# Patient Record
Sex: Female | Born: 1955 | Race: Black or African American | Hispanic: No | Smoking: Former smoker
Health system: Southern US, Community
[De-identification: ages and names within clinical notes are randomized; demographics above are authoritative.]

## PROBLEM LIST (undated history)

## (undated) DIAGNOSIS — E785 Hyperlipidemia, unspecified: Secondary | ICD-10-CM

## (undated) DIAGNOSIS — E669 Obesity, unspecified: Secondary | ICD-10-CM

## (undated) DIAGNOSIS — K219 Gastro-esophageal reflux disease without esophagitis: Secondary | ICD-10-CM

## (undated) DIAGNOSIS — K08109 Complete loss of teeth, unspecified cause, unspecified class: Secondary | ICD-10-CM

## (undated) DIAGNOSIS — T8859XA Other complications of anesthesia, initial encounter: Secondary | ICD-10-CM

## (undated) DIAGNOSIS — E11319 Type 2 diabetes mellitus with unspecified diabetic retinopathy without macular edema: Secondary | ICD-10-CM

## (undated) DIAGNOSIS — F419 Anxiety disorder, unspecified: Secondary | ICD-10-CM

## (undated) DIAGNOSIS — I10 Essential (primary) hypertension: Secondary | ICD-10-CM

## (undated) DIAGNOSIS — Z972 Presence of dental prosthetic device (complete) (partial): Secondary | ICD-10-CM

## (undated) DIAGNOSIS — R079 Chest pain, unspecified: Secondary | ICD-10-CM

## (undated) DIAGNOSIS — H3530 Unspecified macular degeneration: Secondary | ICD-10-CM

## (undated) DIAGNOSIS — Z9889 Other specified postprocedural states: Secondary | ICD-10-CM

## (undated) DIAGNOSIS — E119 Type 2 diabetes mellitus without complications: Secondary | ICD-10-CM

## (undated) DIAGNOSIS — H269 Unspecified cataract: Secondary | ICD-10-CM

## (undated) DIAGNOSIS — T4145XA Adverse effect of unspecified anesthetic, initial encounter: Secondary | ICD-10-CM

## (undated) DIAGNOSIS — I509 Heart failure, unspecified: Secondary | ICD-10-CM

## (undated) DIAGNOSIS — I5022 Chronic systolic (congestive) heart failure: Secondary | ICD-10-CM

## (undated) HISTORY — PX: TONSILLECTOMY: SUR1361

## (undated) HISTORY — DX: Unspecified macular degeneration: H35.30

## (undated) HISTORY — DX: Type 2 diabetes mellitus with unspecified diabetic retinopathy without macular edema: E11.319

## (undated) HISTORY — DX: Unspecified cataract: H26.9

## (undated) HISTORY — PX: MULTIPLE TOOTH EXTRACTIONS: SHX2053

## (undated) HISTORY — DX: Chronic systolic (congestive) heart failure: I50.22

## (undated) HISTORY — PX: CHOLECYSTECTOMY: SHX55

---

## 1998-04-23 ENCOUNTER — Emergency Department (HOSPITAL_COMMUNITY): Admission: EM | Admit: 1998-04-23 | Discharge: 1998-04-24 | Payer: Self-pay

## 2005-05-21 ENCOUNTER — Emergency Department (HOSPITAL_COMMUNITY): Admission: EM | Admit: 2005-05-21 | Discharge: 2005-05-21 | Payer: Self-pay | Admitting: Emergency Medicine

## 2006-10-05 ENCOUNTER — Encounter: Admission: RE | Admit: 2006-10-05 | Discharge: 2006-10-05 | Payer: Self-pay | Admitting: Family Medicine

## 2010-01-25 ENCOUNTER — Emergency Department (HOSPITAL_COMMUNITY): Admission: EM | Admit: 2010-01-25 | Discharge: 2010-01-25 | Payer: Self-pay | Admitting: Family Medicine

## 2010-12-03 ENCOUNTER — Inpatient Hospital Stay (INDEPENDENT_AMBULATORY_CARE_PROVIDER_SITE_OTHER)
Admission: RE | Admit: 2010-12-03 | Discharge: 2010-12-03 | Disposition: A | Discharge status: Home / Self Care | Payer: Self-pay | Source: Ambulatory Visit | Attending: Emergency Medicine | Admitting: Emergency Medicine

## 2010-12-03 DIAGNOSIS — S335XXA Sprain of ligaments of lumbar spine, initial encounter: Secondary | ICD-10-CM

## 2010-12-03 DIAGNOSIS — IMO0002 Reserved for concepts with insufficient information to code with codable children: Secondary | ICD-10-CM

## 2011-09-17 ENCOUNTER — Emergency Department (INDEPENDENT_AMBULATORY_CARE_PROVIDER_SITE_OTHER)
Admission: EM | Admit: 2011-09-17 | Discharge: 2011-09-17 | Disposition: A | Discharge status: Home / Self Care | Payer: Self-pay | Source: Home / Self Care | Attending: Emergency Medicine | Admitting: Emergency Medicine

## 2011-09-17 ENCOUNTER — Encounter (HOSPITAL_COMMUNITY): Payer: Self-pay

## 2011-09-17 DIAGNOSIS — N6019 Diffuse cystic mastopathy of unspecified breast: Secondary | ICD-10-CM

## 2011-09-17 MED ORDER — TRAMADOL HCL 50 MG PO TABS: 100.0000 mg | ORAL_TABLET | Freq: Three times a day (TID) | ORAL | Status: AC | PRN

## 2011-09-17 MED ORDER — NAPROXEN 500 MG PO TABS: 500.0000 mg | ORAL_TABLET | Freq: Two times a day (BID) | ORAL | Status: DC

## 2011-09-17 NOTE — ED Provider Notes (Addendum)
Chief Complaint  Patient presents with  . Breast Pain    History of Present Illness:   Tina Lara is a 56 year old female with a three-day history of pain in the left breast. This is located in the lower, outer quadrant. It's tender to touch. She denies any mass, lump, or abscess. There is no drainage from the nipple. She has no history of breast cysts, breast lumps, breast cancer, and no family history of breast cancer.  Review of Systems:  Other than noted above, the patient denies any of the following symptoms: Systemic:  No fever, chills, sweats, weight loss, or fatigue. ENT:  No nasal congestion, rhinorrhea, sore throat, swelling of lips, tongue or throat. Resp:  No cough, wheezing, or shortness of breath. Skin:  No rash, itching, nodules, or suspicious lesions.  PMFSH:  Past medical history, family history, social history, meds, and allergies were reviewed.  Physical Exam:   Vital signs:  BP 155/90  Pulse 82  Temp 98.6 F (37 C) (Oral)  Resp 18  SpO2 99% Gen:  Alert, oriented, in no distress. ENT:  Pharynx clear, no intraoral lesions, moist mucous membranes. Lungs:  Clear to auscultation. Breast exam: Shows extremely large and pendulous breasts. Exam reveals no mass, lump, or palpable abnormality. There was some tenderness to palpation in the lower, outer quadrant of the left breast. There was no nipple discharge. No axillary adenopathy, mass, or tenderness. Skin:  Skin was clear without any overlying skin changes.  Assessment:  The encounter diagnosis was Fibrocystic breast disease.  Plan:   1.  The following meds were prescribed:   New Prescriptions   NAPROXEN (NAPROSYN) 500 MG TABLET    Take 1 tablet (500 mg total) by mouth 2 (two) times daily.   TRAMADOL (ULTRAM) 50 MG TABLET    Take 2 tablets (100 mg total) by mouth every 8 (eight) hours as needed for pain.   2.  The patient was instructed in symptomatic care and handouts were given. 3.  The patient was told to return  if becoming worse in any way, if no better in 3 or 4 days, and given some red flag symptoms that would indicate earlier return.  Follow up:  The [facility]      Reuben Likes, MD 09/17/11 1645  Reuben Likes, MD 09/17/11 [phone]

## 2011-09-17 NOTE — ED Notes (Signed)
Pain in breast x 2 weeks , no trauma

## 2011-09-17 NOTE — Discharge Instructions (Signed)
Fibrocystic Breast Changes Fibrocystic breast changes is a non-cancerous(benign) condition that about half of all women have at some time in their life. It is also called benign breast disease and mammary dysplasia. It may also be called fibrocystic breast disease, but it is not really a disease. It is a common condition that occurs when women go through the hormonal changes during their menstrual cycle, between the ages of 20 to 50. Menopausal women do not have this problem, unless they are on hormone therapy. It can affect one or both breasts. This is not a sign that you will later get cancer. CAUSES  Overgrowth of cells lining the milk ducts, or enlarged lobules in the breast, cause the breast duct to become blocked. The duct then fills up with fluid. This is like a small balloon filled with water. It is called a cyst. Over time, with repeated inflammation there is a tendency to form scar tissue. This scar tissue becomes the fibrous part of fibrocystic disease. The exact cause of this happening is not known, but it may be related to the female hormones, estrogen and progesterone. Heredity (genetics) may also be a factor in some cases. SYMPTOMS   Tenderness.   Swelling.   Rope-like feeling.   Lumpy breast, one or both sides.   Changes in the size of the breasts, before and after the menstrual period (larger before, smaller after).   Green or dark brown nipple discharge (not blood).  Symptoms are usually worse before periods (menstrual cycle) and get better toward the end of menstruation. Usually, it is temporary minor discomfort. But some women have severe pain.  DIAGNOSIS  Check your breasts monthly. The best time to check your breasts is after your period. If you check them during your period, you are more likely to feel the normal glands enlarged, as a result of the hormonal changes that happen right before your period. If you do not have menstrual periods, check your breasts the first day of  every month. Become familiar with the way your own breasts feel. It is then easier to notice if there are changes, such as more tenderness, a new growth, change in breast size, or a change in a lump that has always been there. All breasts lumps need to be investigated, to rule out breast cancer. See your caregiver as soon as possible, if you find a lump. Most breast lumps are not cancerous. Excellent treatment is available for ones that are.  To make a diagnosis, your caregiver will examine your breasts and may recommend other tests, such as:  Mammogram (breast X-ray).   Ultrasound.   MRI (magnetic resonance imaging).   Removing fluid from the cyst with a fine needle, under local anesthesia (aspiration).   Taking a breast tissue sample (breast biopsy).  Some questions your caregiver will ask are:  What was the date of your last period?   When did the lump show up?   Is there any discharge from your breast?   Is the breast tender or painful?   Are the symptoms in one or both breasts?   Has the lump changed in size from month-to-month? How long has it been present?   Any family history of breast problems?   Any past breast problems?   Any history of breast surgery?   Are you taking any medications?   When was your last mammogram, and where was it done?  TREATMENT   Dietary changes help to prevent or reduce the symptoms of fibrocystic   breast changes.   You may need to stop consuming all foods that contain caffeine, such as chocolate, sodas, coffee, and tea.   Reducing sugar and fat in your diet may also help.   Decrease estrogen in your diet. Some sources include commercially raised meats which contain estrogen. Eliminate other natural estrogens.   Birth control pills can also make symptoms worse.   Natural progesterone cream, applied at a dose of 15 to 20 milligrams per day, from ovulation until a day or two before your period returns, may help with returning to normal  breast tissue over several months. Seek advice from your caregiver.   Over-the-counter pain pills may help, as recommended by your caregiver.   Danazol hormone (female-like hormone) is sometimes used. It may cause hair growth and acne.   Needle aspiration can be used, to remove fluid from the cyst.   Surgery may be needed, to remove a large, persistent, and tender cyst.   Evening primrose oil may help with the tenderness and pain. It has linolenic acid that women may not have enough of.  HOME CARE INSTRUCTIONS   Examine your breasts after every menstrual period.   If you do not have menstrual periods, examine your breasts the first day of every month.   Wear a firm support bra, especially when exercising.   Decrease or avoid caffeine in your diet.   Decrease the fat and sugar in your diet.   Eat a balanced diet.   Try to see your caregiver after you have a menstrual period.   Before seeing your caregiver, make notes about:   When you have the symptoms.   What types of symptoms you are having.   Medications you are taking.   When and where your last mammogram was taken.   Past breast problems or breast surgery.  SEEK MEDICAL CARE IF:   You have been diagnosed with fibrocystic breast changes, and you develop changes in your breast:   Discharge from the nipple, especially bloody discharge.   Pain in the breast that does not go away after your menstrual period.   New lumps or bumps in the breast.   Lumps in your armpit.   Your breast or breasts become enlarged, red, and painful.   You find an isolated lump, even if it is not tender.   You have questions about this condition that have not been answered.  Document Released: 01/03/2006 Document Revised: 03/08/2011 Document Reviewed: 03/30/2009 [facility] Patient Information 2012 [place], [place].   Take vitamin E 400 units daily.  Avoid caffeine.

## 2011-09-19 ENCOUNTER — Telehealth (HOSPITAL_COMMUNITY): Payer: Self-pay | Admitting: *Deleted

## 2011-09-19 ENCOUNTER — Other Ambulatory Visit: Payer: Self-pay | Admitting: Emergency Medicine

## 2011-09-19 NOTE — ED Notes (Signed)
I called pt. to let her know that [facility] was going [facility] be [facility] her for an appointment. I left a message for her to call. Vassie Moselle 09/19/2011

## 2011-09-19 NOTE — ED Notes (Addendum)
Dr. Lorenz Coaster sent me a message saying pt. needs to be scheduled for diagnostic mammogram and [place] if needed for diagnosis- no lump pain in lower outer quadrant x 3 days. He did not put pt.'s name on request. He could not remember her name.  Tina Lara was able to pull up all his pt.'s for 6/17 and pt.'s name was found.  I filled out a referral fom and faxed it to [phone]. Confirmation received.  [facility]. She said she could not schedule unless I told her if pt. ever had a Mammogram before. I asked Dr. Lorenz Coaster and he said he did not think so. I told her this.  She asked " she's never had a mammogram before?" I said not that he knows of. I asked if she has EPIC and she said yes. I suggested she call the pt. and ask her to verify and then schedule the appointment. Vassie Moselle 09/19/2011

## 2011-09-20 ENCOUNTER — Other Ambulatory Visit: Payer: Self-pay | Admitting: Emergency Medicine

## 2011-09-20 ENCOUNTER — Telehealth (HOSPITAL_COMMUNITY): Payer: Self-pay | Admitting: *Deleted

## 2011-09-20 DIAGNOSIS — N644 Mastodynia: Secondary | ICD-10-CM

## 2011-09-21 ENCOUNTER — Telehealth (HOSPITAL_COMMUNITY): Payer: Self-pay | Admitting: *Deleted

## 2011-09-21 NOTE — ED Notes (Signed)
Pt.contacted on third call and states she does have an appointment next Friday 6/28  @ 1000.  I told her I would let Dr. Lorenz Coaster know and hoped everything was OK. Tina Lara 09/21/2011

## 2011-09-28 ENCOUNTER — Ambulatory Visit
Admission: RE | Admit: 2011-09-28 | Discharge: 2011-09-28 | Disposition: A | Discharge status: Home / Self Care | Payer: No Typology Code available for payment source | Source: Ambulatory Visit | Attending: Emergency Medicine | Admitting: Emergency Medicine

## 2011-09-28 DIAGNOSIS — N644 Mastodynia: Secondary | ICD-10-CM

## 2011-10-25 ENCOUNTER — Emergency Department (INDEPENDENT_AMBULATORY_CARE_PROVIDER_SITE_OTHER): Payer: Self-pay

## 2011-10-25 ENCOUNTER — Encounter (HOSPITAL_COMMUNITY): Payer: Self-pay | Admitting: *Deleted

## 2011-10-25 ENCOUNTER — Emergency Department (HOSPITAL_COMMUNITY)
Admission: EM | Admit: 2011-10-25 | Discharge: 2011-10-25 | Disposition: A | Discharge status: Home / Self Care | Payer: Self-pay | Source: Home / Self Care | Attending: Family Medicine | Admitting: Family Medicine

## 2011-10-25 DIAGNOSIS — M5380 Other specified dorsopathies, site unspecified: Secondary | ICD-10-CM

## 2011-10-25 DIAGNOSIS — M62830 Muscle spasm of back: Secondary | ICD-10-CM

## 2011-10-25 DIAGNOSIS — G548 Other nerve root and plexus disorders: Secondary | ICD-10-CM

## 2011-10-25 LAB — POCT I-STAT, CHEM 8
BUN: 7 mg/dL (ref 6–23)
Calcium, Ion: 1.21 mmol/L (ref 1.12–1.23)
Chloride: 104 mEq/L (ref 96–112)
Creatinine, Ser: 0.9 mg/dL (ref 0.50–1.10)
Glucose, Bld: 156 mg/dL — ABNORMAL HIGH (ref 70–99)
HCT: 46 % (ref 36.0–46.0)
Hemoglobin: 15.6 g/dL — ABNORMAL HIGH (ref 12.0–15.0)
Potassium: 4.1 mEq/L (ref 3.5–5.1)
Sodium: 142 mEq/L (ref 135–145)
TCO2: 26 mmol/L (ref 0–100)

## 2011-10-25 MED ORDER — TRAMADOL HCL 50 MG PO TABS: 50.0000 mg | ORAL_TABLET | Freq: Three times a day (TID) | ORAL | Status: AC | PRN

## 2011-10-25 MED ORDER — NAPROXEN 500 MG PO TABS: 500.0000 mg | ORAL_TABLET | Freq: Two times a day (BID) | ORAL | Status: DC

## 2011-10-25 MED ORDER — CYCLOBENZAPRINE HCL 10 MG PO TABS: 10.0000 mg | ORAL_TABLET | Freq: Two times a day (BID) | ORAL | Status: AC | PRN

## 2011-10-25 MED ORDER — PREDNISONE 20 MG PO TABS: ORAL_TABLET | ORAL | Status: AC

## 2011-10-25 NOTE — ED Provider Notes (Signed)
History     CSN: [id number]  Arrival date & time 10/25/11  1127   First MD Initiated Contact with Patient 10/25/11 1156      Chief Complaint  Patient presents with  . Shoulder Pain    (Consider location/radiation/quality/duration/timing/severity/associated sxs/prior treatment) HPI Comments: 56 y/o morbidly obese female here c/o burning type of pain in left breast and left chest radiating horizontally to left upper back present for about 2 months. Patient was seen on Jun 17/2013 for similar complaint and was referred for a mammogram she had done on June 28/2013 that was not suggestive of breast malignancy. She describes her pain a superficial burning sensation of the skin worse with friction or touching the area. Pain worse in her left upper back. She denies a history of a rash, prior to beginning of pain. Denies cough, shortness of breath or retrosternal chest pain, also denies abdominal pain. Pain no changed by exertion. Has had relief with tramadol but she ran out.    History reviewed. No pertinent past medical history.  History reviewed. No pertinent past surgical history.  No family history on file.  History  Substance Use Topics  . Smoking status: Not on file  . Smokeless tobacco: Not on file  . Alcohol Use: Not on file    OB History    Grav Para Term Preterm Abortions TAB SAB Ect Mult Living                  Review of Systems  Constitutional: Negative for fever, chills, diaphoresis, activity change, appetite change and unexpected weight change.  HENT: Negative for congestion.   Respiratory: Negative for cough, chest tightness and shortness of breath.   Gastrointestinal: Negative for nausea, abdominal pain, diarrhea and constipation.  Skin: Negative for rash.  Neurological: Negative for dizziness.  All other systems reviewed and are negative.    Allergies  Review of patient's allergies indicates no known allergies.  Home Medications   Current Outpatient Rx    Name Route Sig Dispense Refill  . CYCLOBENZAPRINE HCL 10 MG PO TABS Oral Take 1 tablet (10 mg total) by mouth 2 (two) times daily as needed for muscle spasms. 20 tablet 0  . NAPROXEN 500 MG PO TABS Oral Take 1 tablet (500 mg total) by mouth 2 (two) times daily with a meal. 30 tablet 0  . PREDNISONE 20 MG PO TABS  2 tabs oral daily for 5 days 10 tablet 0  . TRAMADOL HCL 50 MG PO TABS Oral Take 1 tablet (50 mg total) by mouth every 8 (eight) hours as needed for pain. 20 tablet 0    BP 166/97  Pulse 88  Temp 98.6 F (37 C) (Oral)  Resp 22  Physical Exam  Nursing note and vitals reviewed. Constitutional: She is oriented to person, place, and time. She appears well-developed and well-nourished. No distress.  HENT:  Head: Normocephalic and atraumatic.  Mouth/Throat: Oropharynx is clear and moist.  Eyes: Conjunctivae are normal. Pupils are equal, round, and reactive to light.  Neck: Normal range of motion. Neck supple. No JVD present. No thyromegaly present.  Cardiovascular: Normal rate, regular rhythm and normal heart sounds.  Exam reveals no gallop and no friction rub.   No murmur heard. Pulmonary/Chest: Effort normal and breath sounds normal. No respiratory distress. She has no wheezes. She has no rales. She exhibits no tenderness.       Hyperesthesia with light touch lateral to left breast extending horizontally to skin at intercostal space  towards the back. No erythema, no swelling or bruising. No rib crepitus. No rash. No deformity. Normal chest expansion with deep inspiration. Patient has morbidly large breasts. That other wise appear normal on exam. No nodules, masses or nipple discharge. No obvious skin changes over breast.  No axillary or supraclavicular adenopathies.   Abdominal: Soft. There is no tenderness.       obese  Musculoskeletal:       Back: central. Increased tone and tenderness to palpation over left thoracic paravertebral muscles.  Normal left shoulder exam with FROM.   Lymphadenopathy:    She has no cervical adenopathy.  Neurological: She is alert and oriented to person, place, and time.  Skin: No rash noted.    ED Course  Procedures (including critical care time)  Labs Reviewed  POCT I-STAT, CHEM 8 - Abnormal; Notable for the following:    Glucose, Bld 156 (*)     Hemoglobin 15.6 (*)     All other components within normal limits  LAB REPORT - SCANNED   Dg Chest 2 View  10/25/2011  *RADIOLOGY REPORT*  Clinical Data: Left side mid thoracic pain, left anterior chest pain in the breast  CHEST - 2 VIEW  Comparison: None  Findings: Enlargement of cardiac silhouette. Mediastinal contours and pulmonary vascularity normal. Lungs clear. No pleural effusion or pneumothorax. No acute osseous findings.  IMPRESSION: Enlargement of cardiac silhouette. No acute abnormalities.  Original Report Authenticated By: Lollie Marrow, M.D.     1. Intercostal neuropathic pain   2. Muscle spasm of back       MDM  EKG: Normal sinus rhythm ventricular rate 76 beats per minutes, no ischemic changes. Random glucose normal. Impress neuropathic type of pain involving an intercostal nerve. Patient at risk for back strain related problems including radiculopathy and chronic back pain as she carries very large breasts. Treated with prednisone, flexeril, tramadol and naprosyn. Back exercises hand out provided. Given community resource list to find a PCP.     Sharin Grave, MD 10/26/11 0981

## 2011-10-25 NOTE — ED Notes (Signed)
Pt  Reports  Pain  l  Shoulder  And  Upper  Back  For over  1   Month    Pt     denys  Any  specefic  Injury     -  She  Is  Sitting  Upright on the  Exam table  Speaking in  Complete  sentances  In no  Distress

## 2011-12-04 ENCOUNTER — Encounter (HOSPITAL_COMMUNITY): Payer: Self-pay | Admitting: Emergency Medicine

## 2011-12-04 ENCOUNTER — Emergency Department (HOSPITAL_COMMUNITY)
Admission: EM | Admit: 2011-12-04 | Discharge: 2011-12-04 | Disposition: A | Discharge status: Home / Self Care | Payer: Self-pay | Attending: Emergency Medicine | Admitting: Emergency Medicine

## 2011-12-04 ENCOUNTER — Emergency Department (HOSPITAL_COMMUNITY): Payer: Self-pay

## 2011-12-04 DIAGNOSIS — Z79899 Other long term (current) drug therapy: Secondary | ICD-10-CM | POA: Insufficient documentation

## 2011-12-04 DIAGNOSIS — I10 Essential (primary) hypertension: Secondary | ICD-10-CM | POA: Insufficient documentation

## 2011-12-04 DIAGNOSIS — R109 Unspecified abdominal pain: Secondary | ICD-10-CM | POA: Insufficient documentation

## 2011-12-04 DIAGNOSIS — K5900 Constipation, unspecified: Secondary | ICD-10-CM | POA: Insufficient documentation

## 2011-12-04 HISTORY — DX: Essential (primary) hypertension: I10

## 2011-12-04 LAB — CBC WITH DIFFERENTIAL/PLATELET
Basophils Absolute: 0 10*3/uL (ref 0.0–0.1)
Basophils Relative: 1 % (ref 0–1)
Eosinophils Absolute: 0.1 10*3/uL (ref 0.0–0.7)
Eosinophils Relative: 2 % (ref 0–5)
HCT: 43.4 % (ref 36.0–46.0)
Hemoglobin: 14.9 g/dL (ref 12.0–15.0)
Lymphocytes Relative: 44 % (ref 12–46)
Lymphs Abs: 2.7 10*3/uL (ref 0.7–4.0)
MCH: 29.2 pg (ref 26.0–34.0)
MCHC: 34.3 g/dL (ref 30.0–36.0)
MCV: 85.1 fL (ref 78.0–100.0)
Monocytes Absolute: 0.5 10*3/uL (ref 0.1–1.0)
Monocytes Relative: 9 % (ref 3–12)
Neutro Abs: 2.7 10*3/uL (ref 1.7–7.7)
Neutrophils Relative %: 45 % (ref 43–77)
Platelets: 204 10*3/uL (ref 150–400)
RBC: 5.1 MIL/uL (ref 3.87–5.11)
RDW: 12.4 % (ref 11.5–15.5)
WBC: 6.1 10*3/uL (ref 4.0–10.5)

## 2011-12-04 LAB — BASIC METABOLIC PANEL
BUN: 11 mg/dL (ref 6–23)
CO2: 25 mEq/L (ref 19–32)
Calcium: 9.7 mg/dL (ref 8.4–10.5)
Chloride: 100 mEq/L (ref 96–112)
Creatinine, Ser: 0.75 mg/dL (ref 0.50–1.10)
GFR calc Af Amer: >90 mL/min (ref >90)
GFR calc non Af Amer: >90 mL/min (ref >90)
Glucose, Bld: 202 mg/dL — ABNORMAL HIGH (ref 70–99)
Potassium: 4.2 mEq/L (ref 3.5–5.1)
Sodium: 138 mEq/L (ref 135–145)

## 2011-12-04 LAB — URINALYSIS, ROUTINE W REFLEX MICROSCOPIC
Glucose, UA: NEGATIVE mg/dL
Ketones, ur: 15 mg/dL — AB
Leukocytes, UA: NEGATIVE
Nitrite: NEGATIVE
Protein, ur: NEGATIVE mg/dL
Specific Gravity, Urine: 1.025 (ref 1.005–1.030)
Urobilinogen, UA: 1 mg/dL (ref 0.0–1.0)
pH: 6 (ref 5.0–8.0)

## 2011-12-04 LAB — URINE MICROSCOPIC-ADD ON

## 2011-12-04 LAB — LIPASE, BLOOD: Lipase: 22 U/L (ref 11–59)

## 2011-12-04 MED ORDER — IOHEXOL 300 MG/ML  SOLN
100.0000 mL | Freq: Once | INTRAMUSCULAR | Status: AC | PRN
  Administered 2011-12-04: 100 mL via INTRAVENOUS

## 2011-12-04 MED ORDER — HYDROCODONE-ACETAMINOPHEN 5-500 MG PO TABS: 1.0000 | ORAL_TABLET | Freq: Four times a day (QID) | ORAL | Status: AC | PRN

## 2011-12-04 MED ORDER — IOHEXOL 300 MG/ML  SOLN
20.0000 mL | INTRAMUSCULAR | Status: AC
  Administered 2011-12-04: 20 mL via ORAL

## 2011-12-04 MED ORDER — POLYETHYLENE GLYCOL 3350 17 G PO PACK: 17.0000 g | PACK | Freq: Every day | ORAL | Status: AC

## 2011-12-04 NOTE — ED Notes (Signed)
Pt returned from CT °

## 2011-12-04 NOTE — ED Notes (Signed)
Ct Called to notify pt done with contrast.

## 2011-12-04 NOTE — ED Notes (Addendum)
Pt states she is having sharp shooting pain in LUQ, has difficulty sleeping. Pain has been going on for a week. Can't have bowel movement without taking ex lax. Took 2 yesterday. Thought she had gas or needed to have a bowel movement but pain was unrelieved. Took two advil yesterday evening to relieve pain but did not work.

## 2011-12-04 NOTE — ED Notes (Signed)
Patient transported to CT 

## 2011-12-04 NOTE — ED Provider Notes (Signed)
History     CSN: [id number]  Arrival date & time 12/04/11  0940   First MD Initiated Contact with Patient 12/04/11 1116      Chief Complaint  Patient presents with  . Flank Pain  . Abdominal Pain    (Consider location/radiation/quality/duration/timing/severity/associated sxs/prior treatment) HPI Comments: Patient presents with complaints of left-sided abdominal pain. She states it started about one week ago and has been gradually getting worse. Pain is 2 the left midabdomen and is nonradiating. She denies any nausea vomiting. She has been having some constipation but had a normal bowel movement yesterday and states the pain is not changed after bowel movements. Denies any diarrhea. Denies any blood in her stool. Denies a fevers or chills. Denies any urinary symptoms. She's been taking over-the-counter medicines without relief. She's also been taking stool softeners without relief.  Patient is a 56 y.o. female presenting with flank pain and abdominal pain.  Flank Pain Associated symptoms include abdominal pain. Pertinent negatives include no chest pain, no headaches and no shortness of breath.  Abdominal Pain The primary symptoms of the illness include abdominal pain. The primary symptoms of the illness do not include fever, fatigue, shortness of breath, nausea, vomiting or diarrhea.  Symptoms associated with the illness do not include chills, diaphoresis, hematuria, frequency or back pain.    Past Medical History  Diagnosis Date  . Hypertension     History reviewed. No pertinent past surgical history.  History reviewed. No pertinent family history.  History  Substance Use Topics  . Smoking status: Never Smoker   . Smokeless tobacco: Not on file  . Alcohol Use: No    OB History    Grav Para Term Preterm Abortions TAB SAB Ect Mult Living                  Review of Systems  Constitutional: Negative for fever, chills, diaphoresis and fatigue.  HENT: Negative for  congestion, rhinorrhea and sneezing.   Eyes: Negative.   Respiratory: Negative for cough, chest tightness and shortness of breath.   Cardiovascular: Negative for chest pain and leg swelling.  Gastrointestinal: Positive for abdominal pain. Negative for nausea, vomiting, diarrhea and blood in stool.  Genitourinary: Positive for flank pain. Negative for frequency, hematuria and difficulty urinating.  Musculoskeletal: Negative for back pain and arthralgias.  Skin: Negative for rash.  Neurological: Negative for dizziness, speech difficulty, weakness, numbness and headaches.    Allergies  Review of patient's allergies indicates no known allergies.  Home Medications   Current Outpatient Rx  Name Route Sig Dispense Refill  . IBUPROFEN 200 MG PO TABS Oral Take 400 mg by mouth every 6 (six) hours as needed. For pain    . OVER THE COUNTER MEDICATION Oral Take 2 scoop by mouth daily. [employer] Women's Multi-Vitamin    . HYDROCODONE-ACETAMINOPHEN 5-500 MG PO TABS Oral Take 1-2 tablets by mouth every 6 (six) hours as needed for pain. 15 tablet 0  . POLYETHYLENE GLYCOL 3350 PO PACK Oral Take 17 g by mouth daily. 14 each 0    BP 155/77  Pulse 98  Temp 97.8 F (36.6 C) (Oral)  Resp 17  SpO2 98%  LMP 04/05/2011  Physical Exam  Constitutional: She is oriented to person, place, and time. She appears well-developed and well-nourished.  HENT:  Head: Normocephalic and atraumatic.  Eyes: Pupils are equal, round, and reactive to light.  Neck: Normal range of motion. Neck supple.  Cardiovascular: Normal rate, regular rhythm and normal heart  sounds.   Pulmonary/Chest: Effort normal and breath sounds normal. No respiratory distress. She has no wheezes. She has no rales. She exhibits no tenderness.  Abdominal: Soft. Bowel sounds are normal. There is tenderness (Moderate tenderness to the left upper quadrant.). There is no rebound and no guarding.  Musculoskeletal: Normal range of motion. She exhibits no edema.   Lymphadenopathy:    She has no cervical adenopathy.  Neurological: She is alert and oriented to person, place, and time.  Skin: Skin is warm and dry. No rash noted.  Psychiatric: She has a normal mood and affect.    ED Course  Procedures (including critical care time) Results for orders placed during the hospital encounter of 12/04/11  CBC WITH DIFFERENTIAL      Component Value Range   WBC 6.1  4.0 - 10.5 K/uL   RBC 5.10  3.87 - 5.11 MIL/uL   Hemoglobin 14.9  12.0 - 15.0 g/dL   HCT 16.1  09.6 - 04.5 %   MCV 85.1  78.0 - 100.0 fL   MCH 29.2  26.0 - 34.0 pg   MCHC 34.3  30.0 - 36.0 g/dL   RDW 40.9  81.1 - 91.4 %   Platelets 204  150 - 400 K/uL   Neutrophils Relative 45  43 - 77 %   Neutro Abs 2.7  1.7 - 7.7 K/uL   Lymphocytes Relative 44  12 - 46 %   Lymphs Abs 2.7  0.7 - 4.0 K/uL   Monocytes Relative 9  3 - 12 %   Monocytes Absolute 0.5  0.1 - 1.0 K/uL   Eosinophils Relative 2  0 - 5 %   Eosinophils Absolute 0.1  0.0 - 0.7 K/uL   Basophils Relative 1  0 - 1 %   Basophils Absolute 0.0  0.0 - 0.1 K/uL  BASIC METABOLIC PANEL      Component Value Range   Sodium 138  135 - 145 mEq/L   Potassium 4.2  3.5 - 5.1 mEq/L   Chloride 100  96 - 112 mEq/L   CO2 25  19 - 32 mEq/L   Glucose, Bld 202 (*) 70 - 99 mg/dL   BUN 11  6 - 23 mg/dL   Creatinine, Ser 7.82  0.50 - 1.10 mg/dL   Calcium 9.7  8.4 - 95.6 mg/dL   GFR calc non Af Amer >90  >90 mL/min   GFR calc Af Amer >90  >90 mL/min  LIPASE, BLOOD      Component Value Range   Lipase 22  11 - 59 U/L  URINALYSIS, ROUTINE W REFLEX MICROSCOPIC      Component Value Range   Color, Urine AMBER (*) YELLOW   APPearance CLEAR  CLEAR   Specific Gravity, Urine 1.025  1.005 - 1.030   pH 6.0  5.0 - 8.0   Glucose, UA NEGATIVE  NEGATIVE mg/dL   Hgb urine dipstick TRACE (*) NEGATIVE   Bilirubin Urine SMALL (*) NEGATIVE   Ketones, ur 15 (*) NEGATIVE mg/dL   Protein, ur NEGATIVE  NEGATIVE mg/dL   Urobilinogen, UA 1.0  0.0 - 1.0 mg/dL    Nitrite NEGATIVE  NEGATIVE   Leukocytes, UA NEGATIVE  NEGATIVE  URINE MICROSCOPIC-ADD ON      Component Value Range   Squamous Epithelial / LPF FEW (*) RARE   WBC, UA 0-2  <3 WBC/hpf   RBC / HPF 0-2  <3 RBC/hpf   Bacteria, UA RARE  RARE   Ct Abdomen Pelvis  W Contrast  12/04/2011  *RADIOLOGY REPORT*  Clinical Data: Left upper quadrant pain for 1 week.  CT ABDOMEN AND PELVIS WITH CONTRAST  Technique:  Multidetector CT imaging of the abdomen and pelvis was performed following the standard protocol during bolus administration of intravenous contrast.  Contrast: OMNIPAQUE IOHEXOL 300 MG/ML  SOLN, 1 OMNIPAQUE IOHEXOL 300 MG/ML  SOLN  Comparison: None.  Findings: Lung Bases: Dependent atelectasis.  5 mm subpleural pulmonary nodule in the left lower lobe (image 2 series 3).  Right middle lobe scarring is present.  Liver:  Normal.  Spleen:  Normal.  Gallbladder:  Cholecystectomy.  Common bile duct:  Normal.  Pancreas:  Normal.  Adrenal glands:  Normal.  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  Ureters appear within normal limits.  Stomach:  Normal.  Small bowel:  Duodenum normal.  Small bowel appears normal.  Fat containing ventral hernia is present to the right of midline (image number 38 series 2).  Metallic clip or calcification is present nearby.  Question prior attempted hernia repair.  Colon:   Colonic diverticulosis.  No colonic inflammatory changes. Normal appendix.  Pelvic Genitourinary:  Normal urinary bladder.  Uterine mass and adnexa appear within normal limits.  Artifact is present in the pelvis associated with fat contacting the detector ring.  Bones:  Right greater than left hip osteoarthritis.  No aggressive osseous lesions.  Vasculature: Mild atherosclerosis.  IMPRESSION: No acute abnormality.  Cholecystectomy.  Small fat containing right upper abdominal ventral hernia.   Original Report Authenticated By: Andreas Newport, M.D.       1. Abdominal pain        MDM  Pt feeling better.  No signs of diverticulitis, kidney stone, other pathology in abdomen that would explain pain.  No chest pain or PE symptoms.  Will d/c with pain meds, miralax.  F/u with a PMD or return here if symptoms worsen.        Rolan Bucco, MD 12/04/11 1414

## 2011-12-04 NOTE — ED Notes (Signed)
Pt states no pain when urinating, no blood, and no trouble urinating. She states that when she lifts left breast she belches.

## 2011-12-04 NOTE — ED Notes (Signed)
Pt c/o LUQ pain to left flank pain x 1 week that became worse today; pt sts some constipation due to pain meds x several days

## 2012-11-09 ENCOUNTER — Encounter (HOSPITAL_COMMUNITY): Payer: Self-pay | Admitting: *Deleted

## 2012-11-09 ENCOUNTER — Emergency Department (INDEPENDENT_AMBULATORY_CARE_PROVIDER_SITE_OTHER)
Admission: EM | Admit: 2012-11-09 | Discharge: 2012-11-09 | Disposition: A | Discharge status: Home / Self Care | Payer: Self-pay | Source: Home / Self Care | Attending: Emergency Medicine | Admitting: Emergency Medicine

## 2012-11-09 DIAGNOSIS — M792 Neuralgia and neuritis, unspecified: Secondary | ICD-10-CM

## 2012-11-09 DIAGNOSIS — IMO0002 Reserved for concepts with insufficient information to code with codable children: Secondary | ICD-10-CM

## 2012-11-09 HISTORY — DX: Obesity, unspecified: E66.9

## 2012-11-09 MED ORDER — MELOXICAM 15 MG PO TABS: 15.0000 mg | ORAL_TABLET | Freq: Every day | ORAL | Status: DC

## 2012-11-09 NOTE — ED Notes (Signed)
EKG shown to Dr. Mikel Cella.

## 2012-11-09 NOTE — ED Provider Notes (Signed)
CSN: [id number]     Arrival date & time 11/09/12  1452 History     First MD Initiated Contact with Patient 11/09/12 1504     Chief Complaint  Patient presents with  . Arm Pain   HPI Patient is 57 yo F presenting with 1 week history of progressively worsening pain under L shoulder blade. She states the pain comes from under shoulder blade, across left breast. She started having hyperesthesia of her upper arm associated with rash and now with intermittent numbness of her left arm as well. No weakness of arm. She states pain is a dull, aching, nagging pain. Unable to identify pain triggers, but pain is better with OTC Advil. She has had breast pain before but never had this type of pain before.    Does not take medications, does not smoke, never had heart problems, no significant family cardiac history.   Past Medical History  Diagnosis Date  . Obesity   . Hypertension     never diagnosed by physician per pt   Past Surgical History  Procedure Laterality Date  . Cesarean section      x2   No family history on file. History  Substance Use Topics  . Smoking status: Former Games developer  . Smokeless tobacco: Not on file  . Alcohol Use: No   OB History   Grav Para Term Preterm Abortions TAB SAB Ect Mult Living                 Review of Systems  Constitutional: Negative for fever and chills.  HENT: Negative for congestion.   Eyes: Negative for visual disturbance.  Respiratory: Negative for cough and shortness of breath.   Cardiovascular: Negative for chest pain and leg swelling.  Gastrointestinal: Negative for abdominal pain.  Genitourinary: Negative for dysuria.  Musculoskeletal: Negative for myalgias and arthralgias.  Skin: Negative for rash.  Neurological: Negative for headaches.  All other systems reviewed and are negative.    Allergies  Review of patient's allergies indicates no known allergies.  Home Medications   Current Outpatient Rx  Name  Route  Sig  Dispense   Refill  . ibuprofen (ADVIL,MOTRIN) 200 MG tablet   Oral   Take 400 mg by mouth every 6 (six) hours as needed. For pain         . meloxicam (MOBIC) 15 MG tablet   Oral   Take 1 tablet (15 mg total) by mouth daily.   30 tablet   0   . OVER THE COUNTER MEDICATION   Oral   Take 2 scoop by mouth daily. [employer] Women's Multi-Vitamin          BP 151/95  Pulse 76  Temp(Src) 98 F (36.7 C) (Oral)  Resp 18  SpO2 100%  LMP 04/05/2011 Physical Exam  Constitutional: She is oriented to person, place, and time. She appears well-developed and well-nourished. No distress.  HENT:  Head: Normocephalic and atraumatic.  Eyes: Conjunctivae and EOM are normal. Pupils are equal, round, and reactive to light.  Neck: Normal range of motion. Neck supple.  No cervical spinal tenderness  Cardiovascular: Normal rate, regular rhythm, normal heart sounds and intact distal pulses.   No murmur heard. Pulmonary/Chest: Effort normal and breath sounds normal. She exhibits no tenderness.  Abdominal: There is no tenderness.  Lymphadenopathy:    She has no cervical adenopathy.  Neurological: She is alert and oriented to person, place, and time. No cranial nerve deficit.  Good strength of upper extremities  bilaterally.  Skin: Skin is warm. No rash noted.  Tenderness with light touch of posterior aspect of left arm    ED Course   Procedures (including critical care time)  Labs Reviewed - No data to display No results found. 1. Radicular pain in left arm    EKG: Vent rate 86, normal intervals, normal axis. Slightly peaked T waves compared to old tracing. No STEMI.   MDM  EKG within normal limits. History and exam not concerning for cardiac cause of pain. Given hyperesthesia and intermittent numbness this is likely due to a radicular type pain.  Discussed with patient. Given Mobic to take once daily and d/c OTC Advil. Return if she develops chest pain, SOB, rash or weakness.   Hilarie Fredrickson,  MD 11/09/12 [phone]

## 2012-11-09 NOTE — ED Notes (Signed)
C/O constant "sensitive, aching" pain originating under left scapula, wrapping up left shoulder; having intermittent sharp pains shooting across chest from left side.  Sxs have been going on x 1 wk.  Came in today because she has been having some LUE numbness today.  Denies any SOB, diaphoresis, nausea associated with pain.  Denies any chest pain at present.

## 2012-11-09 NOTE — ED Provider Notes (Signed)
Medical screening examination/treatment/procedure(s) were performed by resident physician practitioner and as supervising physician I was immediately available for consultation/collaboration.  Raynald Blend, MD 11/09/12 1759

## 2013-01-20 ENCOUNTER — Emergency Department (INDEPENDENT_AMBULATORY_CARE_PROVIDER_SITE_OTHER)
Admission: EM | Admit: 2013-01-20 | Discharge: 2013-01-20 | Disposition: A | Discharge status: Home / Self Care | Payer: Self-pay | Source: Home / Self Care | Attending: Family Medicine | Admitting: Family Medicine

## 2013-01-20 ENCOUNTER — Encounter (HOSPITAL_COMMUNITY): Payer: Self-pay | Admitting: Emergency Medicine

## 2013-01-20 DIAGNOSIS — S46819A Strain of other muscles, fascia and tendons at shoulder and upper arm level, unspecified arm, initial encounter: Secondary | ICD-10-CM

## 2013-01-20 DIAGNOSIS — IMO0002 Reserved for concepts with insufficient information to code with codable children: Secondary | ICD-10-CM

## 2013-01-20 DIAGNOSIS — S43499A Other sprain of unspecified shoulder joint, initial encounter: Secondary | ICD-10-CM

## 2013-01-20 DIAGNOSIS — S39012S Strain of muscle, fascia and tendon of lower back, sequela: Secondary | ICD-10-CM

## 2013-01-20 MED ORDER — CYCLOBENZAPRINE HCL 5 MG PO TABS: 5.0000 mg | ORAL_TABLET | Freq: Two times a day (BID) | ORAL | Status: DC | PRN

## 2013-01-20 MED ORDER — TRAMADOL HCL 50 MG PO TABS: 50.0000 mg | ORAL_TABLET | Freq: Four times a day (QID) | ORAL | Status: DC | PRN
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
## 2013-01-22 NOTE — ED Provider Notes (Signed)
Medical screening examination/treatment/procedure(s) were performed by a resident physician or non-physician practitioner and as the supervising physician I was immediately available for consultation/collaboration.  Alecia Doi, MD    Anamarie Hunn S Danyell Shader, MD 01/22/13 1657 

## 2015-01-09 ENCOUNTER — Encounter (HOSPITAL_COMMUNITY): Payer: Self-pay | Admitting: Emergency Medicine

## 2015-01-09 ENCOUNTER — Emergency Department (HOSPITAL_COMMUNITY)
Admission: EM | Admit: 2015-01-09 | Discharge: 2015-01-09 | Disposition: A | Discharge status: Home / Self Care | Payer: Self-pay | Attending: Emergency Medicine | Admitting: Emergency Medicine

## 2015-01-09 ENCOUNTER — Emergency Department (INDEPENDENT_AMBULATORY_CARE_PROVIDER_SITE_OTHER)
Admission: EM | Admit: 2015-01-09 | Discharge: 2015-01-09 | Disposition: A | Discharge status: Other Acute Inpatient Hospital | Payer: Self-pay | Source: Home / Self Care | Attending: Emergency Medicine | Admitting: Emergency Medicine

## 2015-01-09 ENCOUNTER — Emergency Department (HOSPITAL_COMMUNITY): Payer: Self-pay

## 2015-01-09 ENCOUNTER — Encounter (HOSPITAL_COMMUNITY): Payer: Self-pay | Admitting: *Deleted

## 2015-01-09 DIAGNOSIS — I10 Essential (primary) hypertension: Secondary | ICD-10-CM | POA: Insufficient documentation

## 2015-01-09 DIAGNOSIS — R079 Chest pain, unspecified: Secondary | ICD-10-CM | POA: Insufficient documentation

## 2015-01-09 DIAGNOSIS — E669 Obesity, unspecified: Secondary | ICD-10-CM | POA: Insufficient documentation

## 2015-01-09 DIAGNOSIS — Z87891 Personal history of nicotine dependence: Secondary | ICD-10-CM | POA: Insufficient documentation

## 2015-01-09 LAB — I-STAT TROPONIN, ED
Troponin i, poc: 0 ng/mL (ref 0.00–0.08)
Troponin i, poc: 0 ng/mL (ref 0.00–0.08)

## 2015-01-09 LAB — BASIC METABOLIC PANEL
Anion gap: 11 (ref 5–15)
BUN: 9 mg/dL (ref 6–20)
CO2: 26 mmol/L (ref 22–32)
Calcium: 9.2 mg/dL (ref 8.9–10.3)
Chloride: 101 mmol/L (ref 101–111)
Creatinine, Ser: 0.76 mg/dL (ref 0.44–1.00)
GFR calc Af Amer: >60 mL/min (ref >60)
GFR calc non Af Amer: >60 mL/min (ref >60)
Glucose, Bld: 156 mg/dL — ABNORMAL HIGH (ref 65–99)
Potassium: 3.9 mmol/L (ref 3.5–5.1)
Sodium: 138 mmol/L (ref 135–145)

## 2015-01-09 LAB — CBC
HCT: 39.7 % (ref 36.0–46.0)
Hemoglobin: 13.3 g/dL (ref 12.0–15.0)
MCH: 29.2 pg (ref 26.0–34.0)
MCHC: 33.5 g/dL (ref 30.0–36.0)
MCV: 87.1 fL (ref 78.0–100.0)
Platelets: 175 10*3/uL (ref 150–400)
RBC: 4.56 MIL/uL (ref 3.87–5.11)
RDW: 12.5 % (ref 11.5–15.5)
WBC: 4.2 10*3/uL (ref 4.0–10.5)

## 2015-01-09 LAB — I-STAT VENOUS BLOOD GAS, ED
Acid-Base Excess: 4 mmol/L — ABNORMAL HIGH (ref 0.0–2.0)
Bicarbonate: 29.7 mEq/L — ABNORMAL HIGH (ref 20.0–24.0)
O2 Saturation: 99 %
TCO2: 31 mmol/L (ref 0–100)
pCO2, Ven: 49 mmHg (ref 45.0–50.0)
pH, Ven: 7.39 — ABNORMAL HIGH (ref 7.250–7.300)
pO2, Ven: 150 mmHg — ABNORMAL HIGH (ref 30.0–45.0)

## 2015-01-09 MED ORDER — ACETAMINOPHEN 325 MG PO TABS
650.0000 mg | ORAL_TABLET | Freq: Once | ORAL | Status: AC
  Administered 2015-01-09: 650 mg via ORAL
  Filled 2015-01-09: qty 2

## 2015-01-09 MED ORDER — METHOCARBAMOL 500 MG PO TABS: 500.0000 mg | ORAL_TABLET | Freq: Two times a day (BID) | ORAL | Status: DC

## 2015-01-09 MED ORDER — IBUPROFEN 800 MG PO TABS
800.0000 mg | ORAL_TABLET | Freq: Once | ORAL | Status: AC
  Administered 2015-01-09: 800 mg via ORAL
  Filled 2015-01-09: qty 1

## 2015-01-09 MED ORDER — ASPIRIN 81 MG PO CHEW
CHEWABLE_TABLET | ORAL | Status: AC
Start: 2015-01-09 — End: 2015-01-09
  Filled 2015-01-09: qty 4

## 2015-01-09 MED ORDER — HYDROCHLOROTHIAZIDE 12.5 MG PO TABS: 12.5000 mg | ORAL_TABLET | Freq: Every day | ORAL | Status: DC

## 2015-01-09 MED ORDER — CYCLOBENZAPRINE HCL 10 MG PO TABS
10.0000 mg | ORAL_TABLET | Freq: Once | ORAL | Status: AC
  Administered 2015-01-09: 10 mg via ORAL
  Filled 2015-01-09: qty 1

## 2015-01-09 MED ORDER — ASPIRIN 81 MG PO CHEW
324.0000 mg | CHEWABLE_TABLET | Freq: Once | ORAL | Status: AC
  Administered 2015-01-09: 324 mg via ORAL

## 2015-01-09 NOTE — ED Notes (Signed)
Pt c/o left sided chest pain that radiates to left shoulder. Pt does not have a PMD. Pt went to urgent care and was sent here for further eval.

## 2015-01-09 NOTE — ED Provider Notes (Signed)
CSN: [id number]     Arrival date & time 01/09/15  1424 History   First MD Initiated Contact with Patient 01/09/15 1508     Chief Complaint  Patient presents with  . Chest Pain     (Consider location/radiation/quality/duration/timing/severity/associated sxs/prior Treatment) HPI Comments: Patient is a 59 year old female with a past medical history of obesity and hypertension who presents with chest pain that started at 3 am. Symptoms started suddenly and have been intermittent since the onset. The pain is sharp and severe and located in her left chest with radiation to her left shoulder. She reports associated tingling of her left arm during the episodes. She rubs the area of pain which made the initial pain resolve. She has been having recurring episode since waking up this morning. The pain lasts a few seconds at a time and starts without known trigger. She reports an episode of dizziness and diaphoresis when she was at [facility] at midnight but states she was not having pain at this time. She reports not seeing a PCP "in a long time."   Past Medical History  Diagnosis Date  . Obesity   . Hypertension     never diagnosed by physician per pt   Past Surgical History  Procedure Laterality Date  . Cesarean section      x2   No family history on file. Social History  Substance Use Topics  . Smoking status: Former Research scientist (life sciences)  . Smokeless tobacco: None  . Alcohol Use: No   OB History    No data available     Review of Systems  Cardiovascular: Positive for chest pain.  All other systems reviewed and are negative.     Allergies  Review of patient's allergies indicates no known allergies.  Home Medications   Prior to Admission medications   Medication Sig Start Date End Date Taking? Authorizing Provider  cyclobenzaprine (FLEXERIL) 5 MG tablet Take 1 tablet (5 mg total) by mouth 2 (two) times daily as needed for muscle spasms. May cause drowsiness 01/20/13   Janne Napoleon, NP  ibuprofen  (ADVIL,MOTRIN) 200 MG tablet Take 400 mg by mouth every 6 (six) hours as needed. For pain    Historical Provider, MD  meloxicam (MOBIC) 15 MG tablet Take 1 tablet (15 mg total) by mouth daily. 11/09/12   Amber Fidel Levy, MD  OVER THE COUNTER MEDICATION Take 2 scoop by mouth daily. [employer] Women's Multi-Vitamin    Historical Provider, MD  traMADol (ULTRAM) 50 MG tablet Take 1 tablet (50 mg total) by mouth every 6 (six) hours as needed for pain. 01/20/13   Janne Napoleon, NP   BP 193/105 mmHg  Pulse 89  Temp(Src) 97.8 F (36.6 C) (Oral)  Resp 18  Ht 5\' 9"  (1.753 m)  Wt 351 lb 9 oz (159.468 kg)  BMI 51.89 kg/m2  LMP 04/05/2011 Physical Exam  Constitutional: She appears well-developed and well-nourished. No distress.  HENT:  Head: Normocephalic and atraumatic.  Eyes: Conjunctivae and EOM are normal.  Neck: Normal range of motion.  Cardiovascular: Normal rate and regular rhythm.  Exam reveals no gallop and no friction rub.   No murmur heard. No lower extremity edema or calf tenderness to palpation.   Pulmonary/Chest: Effort normal and breath sounds normal. She has no wheezes. She has no rales. She exhibits tenderness.  Left chest tenderness to palpation.   Abdominal: Soft. She exhibits no distension. There is no tenderness. There is no rebound.  Musculoskeletal: Normal range of motion.  Neurological: She is alert. Coordination normal.  Speech is goal-oriented. Moves limbs without ataxia.   Skin: Skin is warm and dry.  Psychiatric: She has a normal mood and affect. Her behavior is normal.  Nursing note and vitals reviewed.   ED Course  Procedures (including critical care time) Labs Review Labs Reviewed  BASIC METABOLIC PANEL - Abnormal; Notable for the following:    Glucose, Bld 156 (*)    All other components within normal limits  I-STAT VENOUS BLOOD GAS, ED - Abnormal; Notable for the following:    pH, Ven 7.390 (*)    pO2, Ven 150.0 (*)    Bicarbonate 29.7 (*)    Acid-Base Excess  4.0 (*)    All other components within normal limits  CBC  I-STAT TROPOININ, ED  Randolm Idol, ED    Imaging Review Dg Chest 2 View  01/09/2015   CLINICAL DATA:  Patient with acute left-sided chest pain.  EXAM: CHEST  2 VIEW  COMPARISON:  Chest radiograph 10/25/2011  FINDINGS: Normal cardiac and mediastinal contours. No consolidative pulmonary opacities. No pleural effusion or pneumothorax. Mid thoracic spine degenerative changes. Cortical irregularity of the distal right clavicle.  IMPRESSION: No acute cardiopulmonary process. Cortical irregularity of the distal right clavicle. Recommend correlation for point tenderness to exclude acute injury.   Electronically Signed   By: Lovey Newcomer M.D.   On: 01/09/2015 16:07   I have personally reviewed and evaluated these images and lab results as part of my medical decision-making.   EKG Interpretation   Date/Time:  Sunday January 09 2015 14:31:58 EDT Ventricular Rate:  84 PR Interval:  152 QRS Duration: 88 QT Interval:  372 QTC Calculation: 439 R Axis:   23 Text Interpretation:  Normal sinus rhythm Cannot rule out Anterior infarct  , age undetermined Abnormal ECG No significant change since last tracing  Confirmed by LITTLE MD, RACHEL [phone]) on 01/09/2015 5:16:21 PM      MDM   Final diagnoses:  Chest pain, unspecified chest pain type    3:56 PM Labs and chest xray pending. Vitals stable and patient afebrile.   9:49 PM Patient has negative delta troponin. Patient's story of chest pain sounds non cardiac. Pain is reproducible and lasts seconds at a time. EKG shows no acute changes. Patient will be discharged with pain medication and HCTZ fro blood pressure. Patient will be [facility] to [facility] for primary care. Patient discussed with Dr. Rex Kras who agrees with plan.   Alvina Chou, PA-C 01/09/15 2154  Sharlett Iles, MD 01/11/15 1534

## 2015-01-09 NOTE — ED Notes (Signed)
Pt reports   Symptoms  Of    l   Chest    Pain        As  Well as pain  Into  Her  Back        With  Some  Tingling  l  Arm  She  Attributed the  Pain      To   Eating          Late  At  Night  Last pm         she  Has a  History  Of  htn       But  Takes  No  bp meds       -

## 2015-01-09 NOTE — Discharge Instructions (Signed)
Take Robaxin as needed for pain. Take HCTZ for elevated blood pressure. Refer to attached documents for more information. Follow up with [facility]. Return to the ED with worsening or concerning symptoms.

## 2015-01-09 NOTE — ED Provider Notes (Signed)
CSN: [id number]     Arrival date & time 01/09/15  1301 History   First MD Initiated Contact with Patient 01/09/15 1321     Chief Complaint  Patient presents with  . Chest Pain   (Consider location/radiation/quality/duration/timing/severity/associated sxs/prior Treatment) HPI  She is a 59 year old woman here for evaluation of chest pain. She states the pain started around 3 AM this morning. She reports a sharp pain in her left anterior chest. She states she rubbed it and it got better. When she woke up, the pain recurred. She continues to have some discomfort in her left anterior chest, mostly with palpation. She also reports a tingling sensation in her left arm. She does state she had cookout around 1 AM. She also reports an episode of dizziness and diaphoresis around midnight when she was at [facility]. She did check her blood pressure at that time and it was 209/103.  She also states she has had a cough for the last week or so.  For the last 3 weeks she has been on a 21 day fast, where she said she avoided pork, fried foods, and salt. She has no known medical history. She has not seen a doctor for quite some time. She does state multiple people in her family have high blood pressure. She states her mom had open heart surgery.  Past Medical History  Diagnosis Date  . Obesity   . Hypertension     never diagnosed by physician per pt   Past Surgical History  Procedure Laterality Date  . Cesarean section      x2   History reviewed. No pertinent family history. Social History  Substance Use Topics  . Smoking status: Former Research scientist (life sciences)  . Smokeless tobacco: None  . Alcohol Use: No   OB History    No data available     Review of Systems As in history of present illness Allergies  Review of patient's allergies indicates no known allergies.  Home Medications   Prior to Admission medications   Medication Sig Start Date End Date Taking? Authorizing Provider  cyclobenzaprine (FLEXERIL) 5 MG  tablet Take 1 tablet (5 mg total) by mouth 2 (two) times daily as needed for muscle spasms. May cause drowsiness 01/20/13   Janne Napoleon, NP  ibuprofen (ADVIL,MOTRIN) 200 MG tablet Take 400 mg by mouth every 6 (six) hours as needed. For pain    Historical Provider, MD  meloxicam (MOBIC) 15 MG tablet Take 1 tablet (15 mg total) by mouth daily. 11/09/12   Amber Fidel Levy, MD  OVER THE COUNTER MEDICATION Take 2 scoop by mouth daily. [place] Women's Multi-Vitamin    Historical Provider, MD  traMADol (ULTRAM) 50 MG tablet Take 1 tablet (50 mg total) by mouth every 6 (six) hours as needed for pain. 01/20/13   Janne Napoleon, NP   Meds Ordered and Administered this Visit   Medications  aspirin chewable tablet 324 mg (not administered)    BP 189/125 mmHg  Pulse 78  Temp(Src) 97.8 F (36.6 C) (Oral)  Resp 18  SpO2 100%  LMP 04/05/2011 No data found.   Physical Exam  Constitutional: She is oriented to person, place, and time. She appears well-developed and well-nourished. No distress.  Neck: Neck supple.  No bruits  Cardiovascular: Normal rate, regular rhythm, normal heart sounds and intact distal pulses.   No murmur heard. Pulmonary/Chest: Effort normal and breath sounds normal. No respiratory distress. She has no wheezes. She has no rales. She exhibits tenderness (left anterior  chest wall).  Neurological: She is alert and oriented to person, place, and time.    ED Course  Procedures (including critical care time) ED ECG REPORT   Date: 01/09/2015  Rate: 77  Rhythm: normal sinus rhythm  QRS Axis: normal  Intervals: normal  ST/T Wave abnormalities: normal  Conduction Disutrbances:none  Narrative Interpretation: normal ekg  Old EKG Reviewed: unchanged  I have personally reviewed the EKG tracing and agree with the computerized printout as noted.   Labs Review Labs Reviewed - No data to display  Imaging Review No results found.    MDM   1. Chest pain, unspecified chest pain type     Aspirin 325 mg given. We'll transfer to [facility] ED via shuttle for additional evaluation. She is a 59 year old woman with a positive family history and additional risk factors. Her history is somewhat atypical, but she warrants evaluation with serial troponins.    Melony Overly, MD 01/09/15 [phone]

## 2016-02-22 ENCOUNTER — Encounter (HOSPITAL_COMMUNITY): Payer: Self-pay | Admitting: Emergency Medicine

## 2016-02-22 ENCOUNTER — Ambulatory Visit (HOSPITAL_COMMUNITY)
Admission: EM | Admit: 2016-02-22 | Discharge: 2016-02-22 | Disposition: A | Discharge status: Home / Self Care | Payer: Self-pay | Attending: Family Medicine | Admitting: Family Medicine

## 2016-02-22 ENCOUNTER — Ambulatory Visit (INDEPENDENT_AMBULATORY_CARE_PROVIDER_SITE_OTHER): Payer: Self-pay

## 2016-02-22 DIAGNOSIS — S8001XA Contusion of right knee, initial encounter: Secondary | ICD-10-CM

## 2016-02-22 DIAGNOSIS — M7918 Myalgia, other site: Secondary | ICD-10-CM

## 2016-02-22 DIAGNOSIS — M791 Myalgia: Secondary | ICD-10-CM

## 2016-02-22 NOTE — Discharge Instructions (Signed)
At this point, you have muscle relaxers and pain medicine which should help you get through the next few days while you're so sore. There is no sign of fracture in the knee. You can apply either ice or warm packs every 4-6 hours to help with the pain.
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
## 2016-08-14 ENCOUNTER — Encounter (HOSPITAL_COMMUNITY): Payer: Self-pay

## 2016-08-14 ENCOUNTER — Emergency Department (HOSPITAL_COMMUNITY): Payer: Self-pay

## 2016-08-14 ENCOUNTER — Observation Stay (HOSPITAL_COMMUNITY)
Admission: EM | Admit: 2016-08-14 | Discharge: 2016-08-15 | Disposition: A | Discharge status: Home / Self Care | Payer: Self-pay | Attending: Internal Medicine | Admitting: Internal Medicine

## 2016-08-14 DIAGNOSIS — E669 Obesity, unspecified: Secondary | ICD-10-CM | POA: Insufficient documentation

## 2016-08-14 DIAGNOSIS — R079 Chest pain, unspecified: Secondary | ICD-10-CM | POA: Diagnosis present

## 2016-08-14 DIAGNOSIS — Z9114 Patient's other noncompliance with medication regimen: Secondary | ICD-10-CM | POA: Insufficient documentation

## 2016-08-14 DIAGNOSIS — Z87891 Personal history of nicotine dependence: Secondary | ICD-10-CM | POA: Insufficient documentation

## 2016-08-14 DIAGNOSIS — R0789 Other chest pain: Principal | ICD-10-CM | POA: Insufficient documentation

## 2016-08-14 DIAGNOSIS — I10 Essential (primary) hypertension: Secondary | ICD-10-CM | POA: Insufficient documentation

## 2016-08-14 DIAGNOSIS — Z6841 Body Mass Index (BMI) 40.0 and over, adult: Secondary | ICD-10-CM | POA: Insufficient documentation

## 2016-08-14 DIAGNOSIS — Z823 Family history of stroke: Secondary | ICD-10-CM

## 2016-08-14 DIAGNOSIS — Z841 Family history of disorders of kidney and ureter: Secondary | ICD-10-CM

## 2016-08-14 DIAGNOSIS — Z8249 Family history of ischemic heart disease and other diseases of the circulatory system: Secondary | ICD-10-CM | POA: Insufficient documentation

## 2016-08-14 HISTORY — DX: Chest pain, unspecified: R07.9

## 2016-08-14 HISTORY — DX: Other complications of anesthesia, initial encounter: T88.59XA

## 2016-08-14 HISTORY — DX: Adverse effect of unspecified anesthetic, initial encounter: T41.45XA

## 2016-08-14 HISTORY — DX: Anxiety disorder, unspecified: F41.9

## 2016-08-14 LAB — CBC
HCT: 37.6 % (ref 36.0–46.0)
Hemoglobin: 12.1 g/dL (ref 12.0–15.0)
MCH: 28.3 pg (ref 26.0–34.0)
MCHC: 32.2 g/dL (ref 30.0–36.0)
MCV: 88.1 fL (ref 78.0–100.0)
Platelets: 162 10*3/uL (ref 150–400)
RBC: 4.27 MIL/uL (ref 3.87–5.11)
RDW: 12.7 % (ref 11.5–15.5)
WBC: 4.8 10*3/uL (ref 4.0–10.5)

## 2016-08-14 LAB — BASIC METABOLIC PANEL
Anion gap: 10 (ref 5–15)
BUN: 12 mg/dL (ref 6–20)
CO2: 21 mmol/L — ABNORMAL LOW (ref 22–32)
Calcium: 8.8 mg/dL — ABNORMAL LOW (ref 8.9–10.3)
Chloride: 105 mmol/L (ref 101–111)
Creatinine, Ser: 0.83 mg/dL (ref 0.44–1.00)
GFR calc Af Amer: >60 mL/min (ref >60)
GFR calc non Af Amer: >60 mL/min (ref >60)
Glucose, Bld: 205 mg/dL — ABNORMAL HIGH (ref 65–99)
Potassium: 4.2 mmol/L (ref 3.5–5.1)
Sodium: 136 mmol/L (ref 135–145)

## 2016-08-14 LAB — RAPID URINE DRUG SCREEN, HOSP PERFORMED
Amphetamines: NOT DETECTED
Barbiturates: NOT DETECTED
Benzodiazepines: NOT DETECTED
Cocaine: NOT DETECTED
Opiates: POSITIVE — AB
Tetrahydrocannabinol: NOT DETECTED

## 2016-08-14 LAB — TROPONIN I
Troponin I: <0.03 ng/mL (ref <0.03)
Troponin I: <0.03 ng/mL (ref <0.03)

## 2016-08-14 LAB — I-STAT TROPONIN, ED: Troponin i, poc: 0 ng/mL (ref 0.00–0.08)

## 2016-08-14 MED ORDER — ACETAMINOPHEN 325 MG PO TABS: 650.0000 mg | ORAL_TABLET | Freq: Four times a day (QID) | ORAL | Status: DC | PRN

## 2016-08-14 MED ORDER — ENOXAPARIN SODIUM 40 MG/0.4ML ~~LOC~~ SOLN
40.0000 mg | SUBCUTANEOUS | Status: DC
  Administered 2016-08-14: 40 mg via SUBCUTANEOUS
  Filled 2016-08-14: qty 0.4

## 2016-08-14 MED ORDER — KETOROLAC TROMETHAMINE 30 MG/ML IJ SOLN
30.0000 mg | Freq: Four times a day (QID) | INTRAMUSCULAR | Status: DC | PRN
  Administered 2016-08-14: 30 mg via INTRAVENOUS
  Filled 2016-08-14: qty 1

## 2016-08-14 MED ORDER — ENOXAPARIN SODIUM 80 MG/0.8ML ~~LOC~~ SOLN: 80.0000 mg | SUBCUTANEOUS | Status: DC

## 2016-08-14 MED ORDER — PANTOPRAZOLE SODIUM 40 MG PO TBEC
40.0000 mg | DELAYED_RELEASE_TABLET | Freq: Two times a day (BID) | ORAL | Status: DC
  Administered 2016-08-14 – 2016-08-15 (×3): 40 mg via ORAL
  Filled 2016-08-14 (×3): qty 1

## 2016-08-14 MED ORDER — ONDANSETRON HCL 4 MG/2ML IJ SOLN: 4.0000 mg | Freq: Four times a day (QID) | INTRAMUSCULAR | Status: DC | PRN

## 2016-08-14 MED ORDER — SODIUM CHLORIDE 0.9% FLUSH
3.0000 mL | Freq: Two times a day (BID) | INTRAVENOUS | Status: DC
  Administered 2016-08-14 (×2): 3 mL via INTRAVENOUS

## 2016-08-14 MED ORDER — HYDROCHLOROTHIAZIDE 12.5 MG PO CAPS
12.5000 mg | ORAL_CAPSULE | Freq: Every day | ORAL | Status: DC
  Administered 2016-08-14 – 2016-08-15 (×2): 12.5 mg via ORAL
  Filled 2016-08-14 (×2): qty 1

## 2016-08-14 MED ORDER — IBUPROFEN 400 MG PO TABS: 400.0000 mg | ORAL_TABLET | Freq: Four times a day (QID) | ORAL | Status: DC | PRN

## 2016-08-14 MED ORDER — ASPIRIN EC 81 MG PO TBEC
81.0000 mg | DELAYED_RELEASE_TABLET | Freq: Every day | ORAL | Status: DC
  Administered 2016-08-14 – 2016-08-15 (×2): 81 mg via ORAL
  Filled 2016-08-14 (×2): qty 1

## 2016-08-14 MED ORDER — NITROGLYCERIN 0.4 MG SL SUBL: 0.4000 mg | SUBLINGUAL_TABLET | SUBLINGUAL | Status: DC | PRN

## 2016-08-14 MED ORDER — SENNOSIDES-DOCUSATE SODIUM 8.6-50 MG PO TABS: 1.0000 | ORAL_TABLET | Freq: Every evening | ORAL | Status: DC | PRN

## 2016-08-14 MED ORDER — MORPHINE SULFATE (PF) 4 MG/ML IV SOLN
4.0000 mg | Freq: Once | INTRAVENOUS | Status: AC
  Administered 2016-08-14: 4 mg via INTRAVENOUS
  Filled 2016-08-14: qty 1

## 2016-08-14 MED ORDER — ONDANSETRON HCL 4 MG/2ML IJ SOLN
4.0000 mg | Freq: Once | INTRAMUSCULAR | Status: AC
  Administered 2016-08-14: 4 mg via INTRAVENOUS
  Filled 2016-08-14: qty 2

## 2016-08-14 MED ORDER — ONDANSETRON HCL 4 MG PO TABS: 4.0000 mg | ORAL_TABLET | Freq: Four times a day (QID) | ORAL | Status: DC | PRN

## 2016-08-14 MED ORDER — GI COCKTAIL ~~LOC~~: 30.0000 mL | Freq: Two times a day (BID) | ORAL | Status: DC | PRN

## 2016-08-14 MED ORDER — ACETAMINOPHEN 650 MG RE SUPP: 650.0000 mg | Freq: Four times a day (QID) | RECTAL | Status: DC | PRN

## 2016-08-14 NOTE — ED Notes (Signed)
Called xray to check on pt due to them not being in the room. Xray states they may have taken her to the waiting room. Will go and get pt.

## 2016-08-14 NOTE — Progress Notes (Signed)
Pt has arrived to 2w from [facility] ED. Telemetry box applied and CCMD notified. Pt oriented to room. Pt updated on plan of care. Pt denies needs at this time. Will continue current plan of care.   Grant Fontana BSN, RN

## 2016-08-14 NOTE — ED Notes (Signed)
Dark green tube sent down to run CMP by Lahoma Rocker, phlebotomist. Lab states they don't have it.

## 2016-08-14 NOTE — H&P (Signed)
Date: 08/14/2016               Patient Name:  Tina Lara MRN: [id number]  DOB: 07-13-1955 Age / Sex: 61 y.o., female   PCP: Patient, No Pcp Per         Medical Service: Internal Medicine Teaching Service         Attending Physician: Dr. Aldine Contes, MD    First Contact: Dr. Danford Bad Pager: [phone]  Second Contact: Dr. Benjamine Mola Pager: [phone]       After Hours (After 5p/  First Contact Pager: [phone]  weekends / holidays): Second Contact Pager: [phone]   Chief Complaint: chest pain  History of Present Illness: 61 year old woman with hypertension and obesity presenting with chest pain. Thought she had strained herself from cleaning up things in the yard recently. Last night around 3 AM - she had a stabbing pain that woke her from her sleep. Located in right upper chest. Rubbing it improves it. Lifting her breast also improves it. Burping improves it. She has associated right upper arm tingling and the pain does radiate to her right posterior shoulder. Lifting things makes the pain worse. No pain on left side at all. Intermittent. No similar pain before. No chest pain with ambulation. Sometimes gets dyspneic with walking. No associated nausea or vomiting. She has had some nausea separate from the episodes. No associated diaphoresis. No orthopnea. No PND.  She has been coughing - productive of lightly yellow mucous. No sick contacts. No recent travel, long car/airplane rides, leg pain. No prior stroke. No fevers/chills, abdominal pain, dysuria.  Mother had open heart surgery. Maternal aunt had MI around her 52s  Meds:  Current Meds  Medication Sig  . acetaminophen (TYLENOL) 500 MG tablet Take 1,000 mg by mouth every 6 (six) hours as needed for moderate pain.  Marland Kitchen ibuprofen (ADVIL,MOTRIN) 200 MG tablet Take 400 mg by mouth every 6 (six) hours as needed for headache or moderate pain.    Allergies: Allergies as of 08/14/2016  . (No Known Allergies)   Past Medical History:    Diagnosis Date  . Hypertension    never diagnosed by physician per pt  . Obesity     Family History: Mother is deceased. She had ?CAD and ESRD. Maternal aunt had CAD. Father is deceased. He had CVA.   Social History: Quit tobacco 30 years ago. 1 pack per week. 7 years total. No alcohol in 2018. Rarely drinks. No illcits.  Review of Systems: A complete ROS was negative except as per HPI.   Physical Exam: Blood pressure (!) 157/81, pulse 64, temperature 97.8 F (36.6 C), temperature source Oral, resp. rate 11, height 5\' 10"  (1.778 m), weight (!) 350 lb (158.8 kg), last menstrual period 04/05/2011, SpO2 99 %. General Apperance: NAD Head: Normocephalic, atraumatic Eyes: PERRL, EOMI, anicteric sclera Ears: Normal external ear canal Nose: Nares normal, septum midline, mucosa normal Throat: Lips, mucosa and tongue normal  Neck: Supple, trachea midline Back: No tenderness or bony abnormality  Lungs: Clear to auscultation bilaterally. No wheezes, rhonchi or rales. Breathing comfortably Chest Wall: Mildly tender to palpation over right upper chest, no deformity Heart: Regular rate and rhythm, no murmur/rub/gallop Abdomen: Soft, nontender, nondistended, no rebound/guarding Extremities: Normal, atraumatic, warm and well perfused, no edema Pulses: 2+ throughout Skin: No rashes or lesions Neurologic: Alert and oriented x 3. CNII-XII intact. Normal strength and sensation  I independently reviewed the EKG and CXR.  EKG: NSR. New T wave inversions  in III, aVF, V6 compared to previous EKG  CXR: No acute interstitial process.  Assessment & Plan by Problem: 61 year old woman with hypertension and obesity presenting with chest pain.  Chest pain: Atypical chest pain for the past few days. Afebrile. HR normal. Hypertensive. O2 sat in the 90s on room air. Troponin POC 0 in ED. EKG with new T wave inversions. CBC normal. CXR with no acute abnormalities. While she does have EKG changes it is  reassuring that her trop is 0. Other differential include possible MSK pain from [facility] ligament stretch or GERD. -Check hemoglobin A1c -Some features concerning for GERD, trial PPI 40mg  BID for 1 month -Nitro prn -UDS -Trend trops and repeat EKG in AM -Echo -GI cocktail prn -Admit to tele -ASA daily -Tylenol prn mild pain, Toradol prn severe pain  HTN: BP 146/92 - 209/102 in ED. Has not been taking any medications for her BP.  -Restart HCTZ 12.5mg  daily  FEN: HH VTE ppx: Lovenox Code: FULL  Dispo: Admit patient to Observation with expected length of stay less than 2 midnights.  Signed: Milagros Loll, MD 08/14/2016, 11:32 AM  Pager: [phone]

## 2016-08-14 NOTE — ED Provider Notes (Signed)
[facility] DEPT Provider Note   CSN: [id number] Arrival date & time: 08/14/16  0847     History   Chief Complaint Chief Complaint  Patient presents with  . Chest Pain    HPI [place] is a 61 y.o. female  with a past medical history of hypertension (noncompliant with medication for 2 weeks) presenting with right sided sharp shooting intermittent chest pain radiating to her back. She explains that it started 2 weeks ago after she was doing some laboring remodeling work on her house and has been worsening in intensity until unbearable this morning. She reports tenderness to touch. She has taken Tylenol, use Biofreeze which has helped and she also reports that belching helps a little bit. She denies history of DVT/PE, malignancy, cough, hemoptysis, estrogen use, leg swelling, calf pain, recent surgery, prolonged immobilization. She reports taking Advil and Tylenol daily but has not taken it today. She has not taken any hypertension medications today. She denies having a PCP and goes to urgent care to get her refills. Denies fever, chills, nausea, vomiting, diaphoresis, shortness of breath or other symptoms.  HPI  Past Medical History:  Diagnosis Date  . Hypertension    never diagnosed by physician per pt  . Obesity     Patient Active Problem List   Diagnosis Date Noted  . Chest pain 08/14/2016    Past Surgical History:  Procedure Laterality Date  . CESAREAN SECTION     x2    OB History    No data available       Home Medications    Prior to Admission medications   Medication Sig Start Date End Date Taking? Authorizing Provider  acetaminophen (TYLENOL) 500 MG tablet Take 1,000 mg by mouth every 6 (six) hours as needed for moderate pain.   Yes [provider]  ibuprofen (ADVIL,MOTRIN) 200 MG tablet Take 400 mg by mouth every 6 (six) hours as needed for headache or moderate pain.   Yes [provider]  cyclobenzaprine (FLEXERIL) 5 MG tablet  Take 1 tablet (5 mg total) by mouth 2 (two) times daily as needed for muscle spasms. May cause drowsiness Patient not taking: Reported on 08/14/2016 01/20/13   Janne Napoleon, NP  hydrochlorothiazide (HYDRODIURIL) 12.5 MG tablet Take 1 tablet (12.5 mg total) by mouth daily. Patient not taking: Reported on 08/14/2016 01/09/15   Alvina Chou, PA-C  meloxicam (MOBIC) 15 MG tablet Take 1 tablet (15 mg total) by mouth daily. Patient not taking: Reported on 08/14/2016 11/09/12   Hairford, Tyler Pita, MD  methocarbamol (ROBAXIN) 500 MG tablet Take 1 tablet (500 mg total) by mouth 2 (two) times daily. Patient not taking: Reported on 08/14/2016 01/09/15   Alvina Chou, PA-C  traMADol (ULTRAM) 50 MG tablet Take 1 tablet (50 mg total) by mouth every 6 (six) hours as needed for pain. Patient not taking: Reported on 08/14/2016 01/20/13   Janne Napoleon, NP    Family History No family history on file.  Social History Social History  Substance Use Topics  . Smoking status: Former Research scientist (life sciences)  . Smokeless tobacco: Never Used  . Alcohol use No     Allergies   Patient has no known allergies.   Review of Systems Review of Systems  Constitutional: Negative for chills and fever.  HENT: Negative for ear pain and sore throat.   Eyes: Negative for pain and visual disturbance.  Respiratory: Negative for cough, choking, chest tightness, shortness of breath, wheezing and stridor.   Cardiovascular: Positive  for chest pain and leg swelling. Negative for palpitations.        Bilateral ankle swelling yesterday.   Gastrointestinal: Negative for abdominal distention, abdominal pain, nausea and vomiting.  Genitourinary: Negative for dysuria and hematuria.  Musculoskeletal: Positive for back pain and myalgias. Negative for arthralgias, neck pain and neck stiffness.       Patient reports chronic back pain and chronic knee pain.   Skin: Negative for color change, pallor, rash and wound.  Neurological: Negative for  dizziness, seizures, syncope and weakness.     Physical Exam Updated Vital Signs BP (!) 146/92   Pulse 65   Temp 97.8 F (36.6 C) (Oral)   Resp 19   Ht 5\' 10"  (1.778 m)   Wt (!) 158.8 kg   LMP 04/05/2011   SpO2 99%   BMI 50.22 kg/m   Physical Exam  Constitutional: She appears well-developed and well-nourished. No distress.  Afebrile, nontoxic-appearing, lying comfortably in bed in no acute distress.  HENT:  Head: Normocephalic and atraumatic.  Eyes: Conjunctivae and EOM are normal.  Neck: Neck supple.  Cardiovascular: Normal rate, regular rhythm, normal heart sounds and intact distal pulses.   No murmur heard. Pulmonary/Chest: Effort normal and breath sounds normal. No respiratory distress. She has no wheezes. She has no rales. She exhibits no tenderness.  Abdominal: Soft. She exhibits no distension. There is no tenderness.  Musculoskeletal: Normal range of motion. She exhibits tenderness. She exhibits no edema or deformity.  Tenderness palpation of the right sternal border and right breast   Neurological: She is alert.  Skin: Skin is warm and dry. She is not diaphoretic.  Psychiatric: She has a normal mood and affect.  Nursing note and vitals reviewed.    ED Treatments / Results  Labs (all labs ordered are listed, but only abnormal results are displayed) Labs Reviewed  BASIC METABOLIC PANEL - Abnormal; Notable for the following:       Result Value   CO2 21 (*)    Glucose, Bld 205 (*)    Calcium 8.8 (*)    All other components within normal limits  CBC  RAPID URINE DRUG SCREEN, HOSP PERFORMED  TROPONIN I  TROPONIN I  I-STAT TROPOININ, ED    EKG  EKG Interpretation  Date/Time:  Tuesday Aug 14 2016 08:54:06 EDT Ventricular Rate:  85 PR Interval:  146 QRS Duration: 86 QT Interval:  378 QTC Calculation: 449 R Axis:   55 Text Interpretation:  Normal sinus rhythm Nonspecific ST and T wave abnormality Abnormal ECG No STEMI. New T waves inversions lateral and  inferior.  Confirmed by Nanda Quinton [phone]) on 08/14/2016 10:59:19 AM       Radiology Dg Chest 2 View  Result Date: 08/14/2016 CLINICAL DATA:  Chest pain for 2 weeks EXAM: CHEST  2 VIEW COMPARISON:  01/09/2015 FINDINGS: Heart and mediastinal contours are within normal limits. No focal opacities or effusions. No acute bony abnormality. IMPRESSION: No active cardiopulmonary disease. Electronically Signed   By: Rolm Baptise M.D.   On: 08/14/2016 09:17    Procedures Procedures (including critical care time)  Medications Ordered in ED Medications  hydrochlorothiazide (MICROZIDE) capsule 12.5 mg (12.5 mg Oral Given 08/14/16 1133)  nitroGLYCERIN (NITROSTAT) SL tablet 0.4 mg (not administered)  pantoprazole (PROTONIX) EC tablet 40 mg (not administered)  gi cocktail (Maalox,Lidocaine,Donnatal) (not administered)  ondansetron (ZOFRAN) injection 4 mg (4 mg Intravenous Given 08/14/16 1044)  morphine 4 MG/ML injection 4 mg (4 mg Intravenous Given 08/14/16 1044)  Initial Impression / Assessment and Plan / ED Course  I have reviewed the triage vital signs and the nursing notes.  Pertinent labs & imaging results that were available during my care of the patient were reviewed by me and considered in my medical decision making (see chart for details).     Patient presents with 2 weeks of intermittent right-sided chest pain after physical work on her house. She reports sudden worsening this morning while at rest.  EKG showing inferior lead changes with T-wave inversions. Initial troponin is negative. Labs otherwise unremarkable. Patient had initial blood pressure reading of 209/102 and has been noncompliant with antihypertensives.   Spoke to Dr. Aldine Contes who will be admitting patient to telemetry for observation.  Final Clinical Impressions(s) / ED Diagnoses   Final diagnoses:  Other chest pain    New Prescriptions New Prescriptions   No medications on file     Dossie Der 08/14/16 [place], Wonda Old[place], MD 08/14/16 [phone]

## 2016-08-14 NOTE — ED Triage Notes (Signed)
Per Pt, Pt is coming from home with complaints of intermittent central chest pain x 2 weeks. Pt reports feeling like a strained muscle, but it has gotten worse throughout the weeks. Denies any SOB, N/V/D. Reports Hx of HTN and being out of medication for a week.

## 2016-08-14 NOTE — ED Notes (Addendum)
Pt will be transported to room following xray per [facility].

## 2016-08-15 ENCOUNTER — Observation Stay (HOSPITAL_BASED_OUTPATIENT_CLINIC_OR_DEPARTMENT_OTHER): Payer: Self-pay

## 2016-08-15 ENCOUNTER — Telehealth: Payer: Self-pay

## 2016-08-15 DIAGNOSIS — K219 Gastro-esophageal reflux disease without esophagitis: Secondary | ICD-10-CM

## 2016-08-15 DIAGNOSIS — I351 Nonrheumatic aortic (valve) insufficiency: Secondary | ICD-10-CM

## 2016-08-15 LAB — BASIC METABOLIC PANEL
Anion gap: 6 (ref 5–15)
BUN: 12 mg/dL (ref 6–20)
CO2: 28 mmol/L (ref 22–32)
Calcium: 8.6 mg/dL — ABNORMAL LOW (ref 8.9–10.3)
Chloride: 103 mmol/L (ref 101–111)
Creatinine, Ser: 1.04 mg/dL — ABNORMAL HIGH (ref 0.44–1.00)
GFR calc Af Amer: >60 mL/min (ref >60)
GFR calc non Af Amer: 58 mL/min — ABNORMAL LOW (ref >60)
Glucose, Bld: 187 mg/dL — ABNORMAL HIGH (ref 65–99)
Potassium: 4.1 mmol/L (ref 3.5–5.1)
Sodium: 137 mmol/L (ref 135–145)

## 2016-08-15 LAB — CBC
HCT: 35.1 % — ABNORMAL LOW (ref 36.0–46.0)
Hemoglobin: 10.9 g/dL — ABNORMAL LOW (ref 12.0–15.0)
MCH: 27.1 pg (ref 26.0–34.0)
MCHC: 31.1 g/dL (ref 30.0–36.0)
MCV: 87.3 fL (ref 78.0–100.0)
Platelets: 175 10*3/uL (ref 150–400)
RBC: 4.02 MIL/uL (ref 3.87–5.11)
RDW: 12.5 % (ref 11.5–15.5)
WBC: 4.8 10*3/uL (ref 4.0–10.5)

## 2016-08-15 LAB — HEMOGLOBIN A1C
Hgb A1c MFr Bld: <4.2 % — ABNORMAL LOW (ref 4.8–5.6)
Mean Plasma Glucose: <74 mg/dL

## 2016-08-15 LAB — HIV ANTIBODY (ROUTINE TESTING W REFLEX): HIV Screen 4th Generation wRfx: NONREACTIVE

## 2016-08-15 MED ORDER — PANTOPRAZOLE SODIUM 40 MG PO TBEC: 40.0000 mg | DELAYED_RELEASE_TABLET | Freq: Two times a day (BID) | ORAL | 0 refills | Status: DC

## 2016-08-15 MED ORDER — LISINOPRIL 10 MG PO TABS
10.0000 mg | ORAL_TABLET | Freq: Every day | ORAL | Status: DC
  Administered 2016-08-15: 10 mg via ORAL
  Filled 2016-08-15: qty 1

## 2016-08-15 MED ORDER — PERFLUTREN LIPID MICROSPHERE
1.0000 mL | INTRAVENOUS | Status: AC | PRN
  Filled 2016-08-15: qty 10

## 2016-08-15 MED ORDER — ASPIRIN 81 MG PO TBEC: 81.0000 mg | DELAYED_RELEASE_TABLET | Freq: Every day | ORAL | 0 refills | Status: DC

## 2016-08-15 MED ORDER — LISINOPRIL-HYDROCHLOROTHIAZIDE 10-12.5 MG PO TABS: 1.0000 | ORAL_TABLET | Freq: Every day | ORAL | 0 refills | Status: DC

## 2016-08-15 MED ORDER — LISINOPRIL 10 MG PO TABS: 10.0000 mg | ORAL_TABLET | Freq: Every day | ORAL | 0 refills | Status: DC

## 2016-08-15 NOTE — Discharge Instructions (Addendum)

## 2016-08-15 NOTE — Progress Notes (Signed)
   Subjective: Had intermittent chest pain throughout the night in the right upper chest.   Objective:  Vital signs in last 24 hours: Vitals:   08/14/16 1404 08/14/16 1450 08/14/16 1941 08/15/16 0818  BP: (!) 154/65 (!) 194/104 (!) 167/95 (!) 183/87  Pulse: 69 82 82 77  Resp: 12 18 18 20   Temp:  98.1 F (36.7 C) 97.6 F (36.4 C) 98.3 F (36.8 C)  TempSrc:  Oral Oral Oral  SpO2: 98% 100% 100% 100%  Weight:      Height:       General Apperance: NAD HEENT: Normocephalic, atraumatic, anicteric sclera Neck: Supple, trachea midline Lungs: Clear to auscultation bilaterally. No wheezes, rhonchi or rales. Breathing comfortably Heart: Regular rate and rhythm, no murmur/rub/gallop Abdomen: Soft, nontender, nondistended, no rebound/guarding Extremities: Warm and well perfused, no edema Skin: No rashes or lesions Neurologic: Alert and interactive. No gross deficits.  Assessment/Plan: 61 year old woman with hypertension and obesity presenting with chest pain.  Chest pain: UDS unremarkable. Troponins negative x 3. EKG with some normalization of the previously seen T wave changes.  -Hemoglobin A1c pending -Continue PPI 40mg  BID for 1 month -Nitro prn -Echo pending -GI cocktail prn -ASA daily -Tylenol prn mild pain, Toradol prn severe pain -Likely home today with outpatient PCP and cardiology follow up  HTN: BP above goal -HCTZ 12.5mg  daily -Lisinopril 10mg  daily started  FEN: HH VTE ppx: Lovenox Code: FULL  Dispo: Anticipated discharge in approximately 0-1 day(s).   Milagros Loll, MD 08/15/2016, 1:25 PM Pager: [phone]

## 2016-08-15 NOTE — Telephone Encounter (Signed)
Hospital TOC per Dr Randell Patient, discharge 08/15/2016. appt 08/29/2016.

## 2016-08-15 NOTE — Progress Notes (Signed)
  Subjective: Ms. Miramontes reports that her chest pain continued to come and go throughout the night. It generally lasts a few minutes when it arises and it is continuing to come and go this morning. She denies nausea or diaphoresis. She is anxious to leave the hospital given that she is going on a cruise tomorrow.  Objective: Vital signs in last 24 hours: Vitals:   08/14/16 1404 08/14/16 1450 08/14/16 1941 08/15/16 0818  BP: (!) 154/65 (!) 194/104 (!) 167/95 (!) 183/87  Pulse: 69 82 82 77  Resp: 12 18 18 20   Temp:  98.1 F (36.7 C) 97.6 F (36.4 C) 98.3 F (36.8 C)  TempSrc:  Oral Oral Oral  SpO2: 98% 100% 100% 100%  Weight:      Height:       Physical Exam General: Comfortable-appearing obese female resting comfortably in bed. HEENT: Moist mucus membranes. Chest: Mild tenderness to palpation over the right upper chest. Cardiac: Regular rate and rhythm. No murmurs, rubs, or gallops. S1/S2 wnl. Pulmonary: Normal work of breathing. Clear to auscultation bilaterally. Extremities: Warm and dry. No peripheral edema. Dorsalis pedis pulses 2+ bilaterally.  Lab Results: Basic Metabolic Panel:  Recent Labs Lab 08/14/16 0905 08/15/16 0346  NA 136 137  K 4.2 4.1  CL 105 103  CO2 21* 28  GLUCOSE 205* 187*  BUN 12 12  CREATININE 0.83 1.04*  CALCIUM 8.8* 8.6*   CBC:  Recent Labs Lab 08/14/16 0905 08/15/16 0346  WBC 4.8 4.8  HGB 12.1 10.9*  HCT 37.6 35.1*  MCV 88.1 87.3  PLT 162 175   Cardiac Enzymes:  Recent Labs Lab 08/14/16 1655 08/14/16 2110  TROPONINI <0.03 <0.03   Problem List: -- Chest pain -- HTN  Assessment and Plan By Problem: #Chest pain Appears more consistent with musculoskeletal (started after yard work, worse with heavy lifting, tender with palpation) or GERD (worse after eating, better with burping, intermittent) than with ACS. Her troponins were negative x 3. At the same time, she did have new T-wave inversions on initial EKG that resolved this  morning, which is concerning, and she does have risk factors for coronary artery disease including obesity and hypertension. Will work up further with an echo and encourage close outpatient follow-up with Cardiology. -- F/u HgbA1c -- Trial PPI 40mg  BID for 1 month -- Echo pending -- Outpatient cardiology follow-up encouraged  #HTN History of poor blood pressure control and medication non-compliance. Will prescribe lisinopril-HCTZ and encourage close outpatient follow-up. She is uninsured and this will be an important factor in determining follow-up care plan. -- Lisinopril-HCTZ with titration as an outpatient -- Hospital follow-up with PCP scheduled  FENGI: Heart-healthy diet DVT PPX: Lovenox subcut Code: Full Dispo: Anticipated discharge today pending echo.   This is a Careers information officer Note.  The care of the patient was discussed with Dr. Randell Patient and the assessment and plan formulated with their assistance.  Please see their attached note for official documentation of the daily encounter.   LOS: 0 days   Madelynn Done, Medical Student 08/15/2016, 1:55 PM

## 2016-08-15 NOTE — Discharge Summary (Signed)
Name: Tina Lara MRN: [id number] DOB: 1955-09-30 61 y.o. PCP: Patient, No Pcp Per  Date of Admission: 08/14/2016  9:07 AM Date of Discharge: 08/15/2016 Attending Physician: Aldine Contes, MD  Discharge Diagnosis: Chest pain Hypertension  Discharge Medications: Allergies as of 08/15/2016   No Known Allergies     Medication List    STOP taking these medications   hydrochlorothiazide 12.5 MG tablet Commonly known as:  HYDRODIURIL     TAKE these medications   acetaminophen 500 MG tablet Commonly known as:  TYLENOL Take 1,000 mg by mouth every 6 (six) hours as needed for moderate pain.   aspirin 81 MG EC tablet Take 1 tablet (81 mg total) by mouth daily. Start taking on:  08/16/2016   ibuprofen 200 MG tablet Commonly known as:  ADVIL,MOTRIN Take 400 mg by mouth every 6 (six) hours as needed for headache or moderate pain.   lisinopril-hydrochlorothiazide 10-12.5 MG tablet Commonly known as:  PRINZIDE,ZESTORETIC Take 1 tablet by mouth daily.   pantoprazole 40 MG tablet Commonly known as:  PROTONIX Take 1 tablet (40 mg total) by mouth 2 (two) times daily.       Disposition and follow-up:   Tina Lara was discharged from [facility] in Stable condition.  At the hospital follow up visit please address:  1.  Hypertension: She was severely hypertensive with SBP 200 on admission and T wave inversions on EKG likely related to this. Follow up blood pressure on lisinopril-HCTZ 10-12.5mg  and she will probably need titration up in dose.  2.  GERD: Chronic reflux complaints and unclear if contributing to this admission. She was discharged on 40mg  BID pantoprazole please follow up symptom control.  3.  Labs / imaging needed at time of follow-up: Bmet on new dose of lisinopril-HCTZ especially for new dose adjstments  Follow-up Appointments:   Hospital Course by problem list: #Chest pain She presented with sharp chest pain starting around  3AM that was worst over the right chest. This improved with changing position and burping but worsened with physical exertion such as lifting objects. EKG in the ED showed new T wave inversions and she was noted to be severely hypertensive to SBP > 200. Serial troponins were negative and overnight telemetry revealed no arrhythmia. Repeat EKG in the AM showed reversion of T wave changes back to her previous tracings which was attributed to improvement of her hypertension. TTE showed mild LVH and LAE with estimated LVEF of 55% and no moderate or severe valvular disease. She was discharged home after rule out of ACS with severe hypertension, GERD, or chest wall pain felt to be possible explanations of her pain.  #Hypertension Initial blood pressures were highly elevated in 150s-200s in the ED. She started lisinopril and HCTZ with modest reduction in blood pressure. Her symptoms improved by day 1 and was discharged still with severe asymptomatic hypertension to be adjusted in clinic follow up.  Discharge Vitals:   BP (!) 183/87 (BP Location: Left Wrist)   Pulse 77   Temp 98.3 F (36.8 C) (Oral)   Resp 20   Ht 5\' 10"  (1.778 m)   Wt (!) 350 lb (158.8 kg)   LMP 04/05/2011   SpO2 100%   BMI 50.22 kg/m   Pertinent Labs, Studies, and Procedures:  Transthoracic echocardiogram 5/17: Shows mild concentric LVH consistent with grade 1 diastolic dysfunction, estimated LVEF 55%, mild LAE  BMP Latest Ref Rng & Units 08/15/2016 08/14/2016 01/09/2015  Glucose 65 - 99  mg/dL 187(H) 205(H) 156(H)  BUN 6 - 20 mg/dL 12 12 9   Creatinine 0.44 - 1.00 mg/dL 1.04(H) 0.83 0.76  Sodium 135 - 145 mmol/L 137 136 138  Potassium 3.5 - 5.1 mmol/L 4.1 4.2 3.9  Chloride 101 - 111 mmol/L 103 105 101  CO2 22 - 32 mmol/L 28 21(L) 26  Calcium 8.9 - 10.3 mg/dL 8.6(L) 8.8(L) 9.2    Discharge Instructions: Discharge Instructions    Call MD for:  difficulty breathing, headache or visual disturbances    Complete by:  As directed      Call MD for:  persistant dizziness or light-headedness    Complete by:  As directed    Call MD for:  persistant nausea and vomiting    Complete by:  As directed    Call MD for:  severe uncontrolled pain    Complete by:  As directed    Diet - low sodium heart healthy    Complete by:  As directed    Discharge instructions    Complete by:  As directed    Start taking the lisinopril-hydrochlorothiazide once a day for your blood pressure.  Take pantoprazole (Protonix) twice a day for a month. Keep your appointments - if you need to reschedule please call ahead of time. If you have any questions or concerns or develop new or worsening symptoms, please contact your doctor or come to the emergency department.   Increase activity slowly    Complete by:  As directed       Signed: Collier Salina, MD PGY-II Internal Medicine Resident 08/18/2016, 6:34 PM

## 2016-08-15 NOTE — Progress Notes (Signed)
Transitions of Care Pharmacy Note  Plan:  Educated on new medications (lisinopril, PPI) and on importance of BP medications/home BP monitoring  Addressed concerns regarding no prescription insurance - please re-order lisinopril/hctz combination as separate agents and send to [facility] on [address] ($4/month - pt states she can afford this)   --------------------------------------------- Tina Lara is an 61 y.o. female who presents with a chief complaint of chest pain. In anticipation of discharge, pharmacy has reviewed this patient's prior to admission medication history, as well as current inpatient medications listed per the [facility].  Current medication indications, dosing, frequency, and notable side effects reviewed with patient. Patient verbalized understanding of current inpatient medication regimen and is aware that the After Visit Summary when presented, will represent the most accurate medication list at discharge.   Tina Lara expressed concerns regarding affording her medications and getting them before leaving for a cruise tomorrow. Called [facility] on-call provider and made suggestion to switch lisinopril/hctz to separate agents and send to [facility] for $4 list. Provider stated agreement.    Assessment: Understanding of regimen: good Understanding of indications: good Potential of compliance: good Barriers to Obtaining Medications: Yes - no prescription insurance - addressed w/ [facility] $4 list  Patient instructed to contact inpatient pharmacy team with further questions or concerns if needed.    Time spent preparing for discharge counseling: 10 min Time spent counseling patient: 10 min   Thank you for allowing pharmacy to be a part of this patient's care.  Belia Heman, PharmD PGY1 Resident 08/15/2016 4:50 PM

## 2016-08-15 NOTE — Progress Notes (Signed)
All discharge instructions reviewed in detail with pt with understanding verbalized.  Pt denies any pain or discomfort.  Pt educated on importance of taking new medications as prescribed as well as keeping follow up appointments.  No questions or concerns at time of discharge.  Discharged to home in stable condition at approx 1830.

## 2016-08-16 LAB — ECHOCARDIOGRAM COMPLETE
E decel time: 222 msec
E/e' ratio: 17.32
FS: 31 % (ref 28–44)
Height: 70 in
IVS/LV PW RATIO, ED: 1.02
LA ID, A-P, ES: 42 mm
LA diam end sys: 42 mm
LA diam index: 1.58 cm/m2
LA vol A4C: 85.5 ml
LA vol index: 40 mL/m2
LA vol: 106 mL
LV E/e' medial: 17.32
LV E/e'average: 17.32
LV PW d: 14.3 mm — AB (ref 0.6–1.1)
LV e' LATERAL: 6.64 cm/s
LVOT SV: 75 mL
LVOT VTI: 23.8 cm
LVOT area: 3.14 cm2
LVOT diameter: 20 mm
LVOT peak vel: 99.8 cm/s
Lateral S' vel: 11.4 cm/s
MV Dec: 222
MV Peak grad: 5 mmHg
MV pk A vel: 118 m/s
MV pk E vel: 115 m/s
TAPSE: 29.5 mm
TDI e' lateral: 6.64
TDI e' medial: 5.22
Weight: 5600 oz

## 2016-08-28 NOTE — Progress Notes (Signed)
Results discussed with patient via phone. She plans to be at the follow up appt tomorrow.

## 2016-08-29 ENCOUNTER — Ambulatory Visit (INDEPENDENT_AMBULATORY_CARE_PROVIDER_SITE_OTHER): Payer: Self-pay | Admitting: Internal Medicine

## 2016-08-29 ENCOUNTER — Encounter: Payer: Self-pay | Admitting: Internal Medicine

## 2016-08-29 VITALS — BP 158/69 | HR 80 | Temp 98.4°F | Ht 70.0 in | Wt 359.4 lb

## 2016-08-29 DIAGNOSIS — Z87891 Personal history of nicotine dependence: Secondary | ICD-10-CM

## 2016-08-29 DIAGNOSIS — R0789 Other chest pain: Secondary | ICD-10-CM

## 2016-08-29 DIAGNOSIS — E119 Type 2 diabetes mellitus without complications: Secondary | ICD-10-CM | POA: Insufficient documentation

## 2016-08-29 DIAGNOSIS — R079 Chest pain, unspecified: Secondary | ICD-10-CM

## 2016-08-29 DIAGNOSIS — K219 Gastro-esophageal reflux disease without esophagitis: Secondary | ICD-10-CM

## 2016-08-29 DIAGNOSIS — N921 Excessive and frequent menstruation with irregular cycle: Secondary | ICD-10-CM

## 2016-08-29 DIAGNOSIS — Z0000 Encounter for general adult medical examination without abnormal findings: Secondary | ICD-10-CM

## 2016-08-29 DIAGNOSIS — I10 Essential (primary) hypertension: Secondary | ICD-10-CM | POA: Insufficient documentation

## 2016-08-29 DIAGNOSIS — E669 Obesity, unspecified: Secondary | ICD-10-CM

## 2016-08-29 DIAGNOSIS — N939 Abnormal uterine and vaginal bleeding, unspecified: Secondary | ICD-10-CM

## 2016-08-29 LAB — GLUCOSE, CAPILLARY: Glucose-Capillary: 159 mg/dL — ABNORMAL HIGH (ref 65–99)

## 2016-08-29 LAB — POCT GLYCOSYLATED HEMOGLOBIN (HGB A1C): Hemoglobin A1C: 8.2

## 2016-08-29 MED ORDER — DICLOFENAC SODIUM 1 % TD GEL: 4.0000 g | Freq: Four times a day (QID) | TRANSDERMAL | 2 refills | Status: DC

## 2016-08-29 NOTE — Assessment & Plan Note (Addendum)
Continues to have right chest wall pain with reproducible tenderness over the chest. This is likely MSK pain. However, she has risk factors for CAD including obesity, HTN, impaired glucose metabolism.  Will repeat hgab1c  Will check lipid panel Will do trial of voltaren gel.  Will order stress test

## 2016-08-29 NOTE — Assessment & Plan Note (Addendum)
Ordered mammogram Will check lipid panel

## 2016-08-29 NOTE — Assessment & Plan Note (Addendum)
Previously had multiple lab draws where her random glucose was >200 but hgba1c was less than the lower limit of lab last admission.  Will recheck hgba1c today.   Addendum: hgba1c 8.2, meets diagnosis criteria for diabetes. Lipid panel checked. Results with ASCVD risk 23%.   Will start metformin 500mg  bid.  Consider discussing moderate intensity statin on next visit.   I tried to call the patient to give the results but was unable to reach her. Will need to discuss lifestyle changes next visit. She already made lot of changes since her hospital discharge.

## 2016-08-29 NOTE — Progress Notes (Signed)
   CC: HFU for HTN and chest pain admission  HPI:  Tina Lara is a 61 y.o. with pmh as listed below is here for HFU for HTN and chest pain.  Past Medical History:  Diagnosis Date  . Anxiety   . Chest pain 08/14/2016  . Complication of anesthesia    I have a high tolerance ,I had toruble witha spinal block not taking "  . Hypertension    never diagnosed by physician per pt  . Obesity     Was admitted 5/15 to 5/16 for chest pain, found to be severely hypertensive w/ SBP 200 with TWI on EKG. trops neg, TWI normalized on repeat EKG, her CP was thought to be from chest wall pain vs GERD vs due to severe HTN.   She was started on lisinopril 10 and hctz 12.5mg  daily on discharge for HTN. Needs BmeT.   GERD - started on protonix 40mg  bid  She continues to have chest pain on right chest, has tenderness over this area to touch. Also continues to have heart burn and burping. Noticed worsening of sx with spicy food and milk. Is not really taking protonix in empty stomach all the time.  Also having prolonged menstrual bleeding for last 13 days. Had hx of irregular periods in the past where she went without a cycle for over 1 year but last few months her periods were regular. This month her period is lasting longer than usual (13 days). No dysuria or any other urinary symptoms. Did not want pelvic exam today. Wanted gyn referral but she does not have insurance.   Review of Systems:   Review of Systems  Constitutional: Negative for chills and fever.  Respiratory: Negative for sputum production and shortness of breath.   Cardiovascular: Positive for chest pain. Negative for palpitations.  Gastrointestinal: Positive for heartburn. Negative for abdominal pain, blood in stool, diarrhea, nausea and vomiting.  Genitourinary: Negative for dysuria, flank pain, frequency, hematuria and urgency.  Neurological: Negative for dizziness and headaches.     Physical Exam:  Vitals:   08/29/16  1049  BP: (!) 158/69  Pulse: 80  Temp: 98.4 F (36.9 C)  TempSrc: Oral  SpO2: 99%  Weight: (!) 359 lb 6.4 oz (163 kg)  Height: 5\' 10"  (1.778 m)   Physical Exam  Constitutional: She is oriented to person, place, and time. She appears well-developed and well-nourished.  HENT:  Head: Normocephalic and atraumatic.  Eyes: Conjunctivae are normal. Right eye exhibits no discharge. Left eye exhibits no discharge.  Cardiovascular: Normal rate and regular rhythm.  Exam reveals no gallop and no friction rub.   No murmur heard. Respiratory: Effort normal and breath sounds normal. No respiratory distress. She has no wheezes.  Has tenderness over the R chest wall. No rash or skin changes.   Musculoskeletal:  Trace edema on b/l feet.   Neurological: She is alert and oriented to person, place, and time.  Psychiatric: She has a normal mood and affect.    Assessment & Plan:   See Encounters Tab for problem based charting.  Patient discussed with Dr. Angelia Mould

## 2016-08-29 NOTE — Assessment & Plan Note (Signed)
Continues to have GERD symptoms including burping and heart burn.  Instructed her to take her protonix in empty stomach. Cont protonix 40mg  bid for now. Also will add gas X to help with burping Advised to avoid fried food, spicy food, and lactose products.

## 2016-08-29 NOTE — Assessment & Plan Note (Signed)
Vitals:   08/29/16 1049  BP: (!) 158/69  Pulse: 80  Temp: 98.4 F (36.9 C)   BP remains elevated on lisinopril 10 and hctz 12.5 mg daily which was started recently in the hospital.  Will check BMET today and likely increase hctz to 25 mg daily.

## 2016-08-29 NOTE — Patient Instructions (Signed)
We have ordered several tests. Will try to get them done. Will call you with the results. These include transvaginal ultrasound, cardiac stress test, and blood work.  Follow up in 1 month.

## 2016-08-29 NOTE — Progress Notes (Signed)
Internal Medicine Clinic Attending  Case discussed with Dr. Genene Churn at the time of the visit.  We reviewed the resident's history and exam and pertinent patient test results.  I agree with the assessment, diagnosis, and plan of care documented in the resident's note. We reviewed her laboratory tests from her hospitalization, interestingly it appears that she had a random glucose over 200 and fasting of 186 yet A1c was <4.3, this makes me suspect the A1c is inaccurate.  We therefore repeated an A1c today which does confirm our suspicion that she does have T2DM.  Her Chest pain is atypical but given her age, uncontrolled HTN, and now diagnosis of DM I do think that she needs evaluation with a cardiac stress test.  I would also check a lipid panel to help risk stratify her.

## 2016-08-29 NOTE — Assessment & Plan Note (Signed)
Having prolonged menstrual cycle with hx of irregular periods. She likely has PCOS as she is obese and has hx of irregular periods in the past but recently has periods have been regular. Patient did not want a pelvic exam today. We can't refer her to gyn due to lack of insurance for now.  Will try to get transvaginal u/s.

## 2016-08-30 ENCOUNTER — Telehealth: Payer: Self-pay

## 2016-08-30 LAB — LIPID PANEL
Chol/HDL Ratio: 3.5 ratio (ref 0.0–4.4)
Cholesterol, Total: 161 mg/dL (ref 100–199)
HDL: 46 mg/dL (ref >39)
LDL Calculated: 99 mg/dL (ref 0–99)
Triglycerides: 81 mg/dL (ref 0–149)
VLDL Cholesterol Cal: 16 mg/dL (ref 5–40)

## 2016-08-30 LAB — BASIC METABOLIC PANEL
BUN/Creatinine Ratio: 13 (ref 9–23)
BUN: 11 mg/dL (ref 6–24)
CO2: 25 mmol/L (ref 18–29)
Calcium: 8.5 mg/dL — ABNORMAL LOW (ref 8.7–10.2)
Chloride: 104 mmol/L (ref 96–106)
Creatinine, Ser: 0.88 mg/dL (ref 0.57–1.00)
GFR calc Af Amer: 84 mL/min/{1.73_m2} (ref >59)
GFR calc non Af Amer: 73 mL/min/{1.73_m2} (ref >59)
Glucose: 180 mg/dL — ABNORMAL HIGH (ref 65–99)
Potassium: 4.3 mmol/L (ref 3.5–5.2)
Sodium: 141 mmol/L (ref 134–144)

## 2016-08-30 MED ORDER — METFORMIN HCL 500 MG PO TABS: 500.0000 mg | ORAL_TABLET | Freq: Two times a day (BID) | ORAL | 2 refills | Status: DC

## 2016-08-30 NOTE — Telephone Encounter (Signed)
Requesting lab results. Please call pt back.  

## 2016-08-30 NOTE — Addendum Note (Signed)
Addended by: Dellia Nims on: 08/30/2016 12:52 PM   Modules accepted: Orders

## 2016-09-01 NOTE — Telephone Encounter (Signed)
I was unable to reach her again and had to leave VM to call back. When she calls back, please relay to her that her lab results showed that her kidney function was normal.  It showed she has diabetes which we were worried about. I have sent in a Rx for metformin to her pharmacy. She should start taking this and follow up in 1 month for this and her HTN like we discussed before she left. On her follow up, we can discuss the lifestyle modifications.

## 2016-09-03 MED ORDER — LISINOPRIL 20 MG PO TABS: 20.0000 mg | ORAL_TABLET | Freq: Every day | ORAL | 2 refills | Status: DC

## 2016-09-03 MED ORDER — HYDROCHLOROTHIAZIDE 25 MG PO TABS: 25.0000 mg | ORAL_TABLET | Freq: Every day | ORAL | 2 refills | Status: DC

## 2016-09-03 NOTE — Telephone Encounter (Signed)
Pt informed "kidney fcn was normal; she has diabetes, rx for Metformin sen to the pharmacy" per Dr Genene Churn. And to f/u in a month for diabetes and HTN. Pt states she is not on combination med nor HCTZ for HTN. Only Lisinopril 10 mg. And her BP is spiking back up  as high as 200's / 112. Told her I will inform Dr Genene Churn.

## 2016-09-03 NOTE — Telephone Encounter (Signed)
Given her BP remains elevated, I increased her HCTz to 25mg  daily and lisinopril to 20mg  daily. Please let her know to pick these meds up at the pharmacy. We will recheck BP and lab when she comes back.

## 2016-09-03 NOTE — Telephone Encounter (Signed)
Pt called / informed of new rxs for HCTZ and Lisinopril per Dr Genene Churn. And already has an appt on the 29th.

## 2016-09-03 NOTE — Telephone Encounter (Signed)
Pt had f/u visit 08/29/16. Talked to pt today about new rx for Metformin. And new rxs for HCTZ and Lisinopril d/t increase per Dr Genene Churn.

## 2016-09-03 NOTE — Addendum Note (Signed)
Addended by: Dellia Nims on: 09/03/2016 12:46 PM   Modules accepted: Orders

## 2016-09-04 ENCOUNTER — Ambulatory Visit: Payer: Self-pay

## 2016-09-24 ENCOUNTER — Other Ambulatory Visit: Payer: Self-pay | Admitting: *Deleted

## 2016-09-24 DIAGNOSIS — N939 Abnormal uterine and vaginal bleeding, unspecified: Secondary | ICD-10-CM

## 2016-09-24 NOTE — Progress Notes (Signed)
Received request from preservice radiology scheduling dept. to add a pelvic complete ultrasound to DrAhmed's transvaginal ultrasound.  Since ordering MD in no longer available-order place under the attending that authorized the the transvaginal ultrasound.Marland KitchenMarland KitchenDespina Hidden Cassady6/25/20182:32 PM

## 2016-09-28 ENCOUNTER — Encounter: Payer: Self-pay | Admitting: Internal Medicine

## 2016-09-28 ENCOUNTER — Encounter: Payer: Self-pay | Admitting: Dietician

## 2016-09-28 ENCOUNTER — Ambulatory Visit (INDEPENDENT_AMBULATORY_CARE_PROVIDER_SITE_OTHER): Payer: Self-pay | Admitting: Internal Medicine

## 2016-09-28 VITALS — BP 166/94 | HR 86 | Temp 97.6°F | Wt 353.8 lb

## 2016-09-28 DIAGNOSIS — I10 Essential (primary) hypertension: Secondary | ICD-10-CM

## 2016-09-28 DIAGNOSIS — Z87891 Personal history of nicotine dependence: Secondary | ICD-10-CM

## 2016-09-28 DIAGNOSIS — E119 Type 2 diabetes mellitus without complications: Secondary | ICD-10-CM

## 2016-09-28 DIAGNOSIS — Z6841 Body Mass Index (BMI) 40.0 and over, adult: Secondary | ICD-10-CM

## 2016-09-28 DIAGNOSIS — Z9114 Patient's other noncompliance with medication regimen: Secondary | ICD-10-CM

## 2016-09-28 DIAGNOSIS — Z0000 Encounter for general adult medical examination without abnormal findings: Secondary | ICD-10-CM

## 2016-09-28 MED ORDER — LOSARTAN POTASSIUM 50 MG PO TABS: 50.0000 mg | ORAL_TABLET | Freq: Every day | ORAL | 2 refills | Status: DC

## 2016-09-28 MED ORDER — ATORVASTATIN CALCIUM 20 MG PO TABS: 20.0000 mg | ORAL_TABLET | Freq: Every day | ORAL | 11 refills | Status: DC

## 2016-09-28 MED ORDER — HYDROCHLOROTHIAZIDE 25 MG PO TABS: 12.5000 mg | ORAL_TABLET | Freq: Every day | ORAL | 2 refills | Status: DC

## 2016-09-28 MED ORDER — SITAGLIPTIN PHOSPHATE 100 MG PO TABS: 100.0000 mg | ORAL_TABLET | Freq: Every day | ORAL | 2 refills | Status: DC

## 2016-09-28 NOTE — Assessment & Plan Note (Signed)
Her lipid profile was done during previous office visit on 08/29/2016, according to that and because of her elevated blood pressure her ASCVD score today was 23.2%-she needs to be on a high intensity statin because of her risk factors.  -Prescription for Lipitor 20 mg daily was given-she will start taking it according to her financial situation. -If she tolerates that dose it can be increased to 40.  -She was also encouraged to lose weight patient does not seems very interested currently.

## 2016-09-28 NOTE — Assessment & Plan Note (Addendum)
She is unable to tolerate metformin, as it causes nausea and dizziness. Her last A1c done on 08/29/2016 was 8.2.  We discuss about Victoza as it can also help her lose weight, patient do not want to inject herself.  -I gave her a new prescription for Januvia 100 mg daily, she will start taking it once she will have money. -Discontinue metformin.

## 2016-09-28 NOTE — Assessment & Plan Note (Signed)
BP Readings from Last 3 Encounters:  09/28/16 (!) 166/94  08/29/16 (!) 158/69  08/15/16 (!) 158/79   Her blood pressure remained elevated. She is noncompliant and never took her medications for the past few days. It is very difficult to assess whether it is medication or not.  She was also stating that she does not has any money to find new medicines now, she will get new prescription once she will get her next paycheck. She will also try asking her daughter if she can help.  Because of her complaint of dry cough, otherwise discontinue lisinopril and started her on losartan 50 mg daily. Patient was advised to take lisinopril 30 mg daily until she will get her losartan.  -Decreased the dose of HCTZ to 12.5 mg daily as increased in dose to 25 mg causing muscle spasms. I did not check any BMP today because she was not taking medications for last few days. -She was advised to follow up with her PCP on July 16 and bring all her medications with her.

## 2016-09-28 NOTE — Progress Notes (Signed)
Met briefly with Ms. Bywater today to introduce myself and find out if she has had an eye exam this year since begin diagnosed with diabetes. She sees Dr. Gevena Cotton for her annual eye exams and has seen him this year.  She has had no diabetes education as she is newly diagnosed with diabetes. She has a supportive daughter and has been cutting back on bread and starchy foods. She does not like a lot of vegetables, so we talked about including the ones she does like even if they are starchy as long as they are prepared in healthy ways and eaten in moderate portions. We also discussed what a hemoglobin A1C is and usual targets for it.  She is not interested in the comprehensive diabetes classes at [facility], but is willing to see me in 1:1 visits same day as she sees the doctor. Request referral and suggested she make an appointment with me on the same day as her next doctor appointment. [place], [place] 09/28/2016 10:42 AM.

## 2016-09-28 NOTE — Progress Notes (Signed)
   CC: For follow-up of her blood pressure and diabetes.  HPI:  [name] is a 61 y.o. with past medical history as listed below came to the clinic for follow-up of her blood pressure and newly diagnosed diabetes.  Patient was not taking any blood pressure medications for last 3-4 days. She was not taking metformin for the past week stating that metformin makes her nauseated and dizzy. According to patient lisinopril is causing an nighttime dry cough. During last visit her HCTZ dose was increased to 25 mg daily, she states that it makes her have muscle spasms.  Patient is morbidly obese but not interested in any weight reduction program. She has very little insight in her chronic illness, and will need a continuous education to stay compliant with medication.  Past Medical History:  Diagnosis Date  . Anxiety   . Chest pain 08/14/2016  . Complication of anesthesia    I have a high tolerance ,I had toruble witha spinal block not taking "  . Hypertension    never diagnosed by physician per pt  . Obesity    Review of Systems:  As per HPI Physical Exam:  Vitals:   09/28/16 0937  BP: (!) 166/94  Pulse: 86  Temp: 97.6 F (36.4 C)  TempSrc: Oral  SpO2: 100%  Weight: (!) 353 lb 12.8 oz (160.5 kg)    General: Vital signs reviewed.  Patient is well-developed and Morbidly obese, in no acute distress and cooperative with exam.  Cardiovascular: RRR, S1 normal, S2 normal, no murmurs, gallops, or rubs. Pulmonary/Chest: Clear to auscultation bilaterally, no wheezes, rales, or rhonchi. Abdominal: Soft, non-tender, non-distended, BS +, no masses, organomegaly, or guarding present.  Extremities: No lower extremity edema bilaterally,  pulses symmetric and intact bilaterally. No cyanosis or clubbing. Skin: Warm, dry and intact. No rashes or erythema. Psychiatric: Normal mood and affect. speech and behavior is normal. Cognition and memory are normal.  Assessment & Plan:   See  Encounters Tab for problem based charting.  Patient discussed with Dr. Daryll Drown

## 2016-09-28 NOTE — Patient Instructions (Addendum)
Thank you for visiting clinic today. I am making some adjustment in your medications today, once you get your new medications, then stop taking your lisinopril and take losartan instead. Until you get losartan you can take lisinopril 30 mg daily as we talked. I'm also decreasing the dose of your other medication which was causing muscle cramps called hydrochlorothiazide, please take half a tablet daily. Stop taking your metformin, I'm giving you a new medication called Januvia, you can get it when you get your next check and then start taking every morning. I'm also giving you a prescription for cholesterol medicine called Lipitor, you can start taking it once you get your paycheck. Please follow-up with your PCP on 10/15/2016.

## 2016-10-01 NOTE — Progress Notes (Signed)
Internal Medicine Clinic Attending  Case discussed with Dr. Amin at the time of the visit.  We reviewed the resident's history and exam and pertinent patient test results.  I agree with the assessment, diagnosis, and plan of care documented in the resident's note.    

## 2016-10-04 ENCOUNTER — Ambulatory Visit (HOSPITAL_COMMUNITY)
Admission: RE | Admit: 2016-10-04 | Discharge: 2016-10-04 | Disposition: A | Discharge status: Home / Self Care | Payer: Self-pay | Source: Ambulatory Visit | Attending: Internal Medicine | Admitting: Internal Medicine

## 2016-10-04 ENCOUNTER — Encounter (HOSPITAL_COMMUNITY): Payer: Self-pay

## 2016-10-04 DIAGNOSIS — N939 Abnormal uterine and vaginal bleeding, unspecified: Secondary | ICD-10-CM

## 2016-10-12 ENCOUNTER — Encounter: Payer: Self-pay | Admitting: *Deleted

## 2016-10-15 ENCOUNTER — Encounter: Payer: Self-pay | Admitting: Internal Medicine

## 2016-10-15 ENCOUNTER — Ambulatory Visit (INDEPENDENT_AMBULATORY_CARE_PROVIDER_SITE_OTHER): Payer: Self-pay | Admitting: Internal Medicine

## 2016-10-15 VITALS — BP 152/53 | HR 92 | Temp 97.5°F | Ht 70.0 in | Wt 355.1 lb

## 2016-10-15 DIAGNOSIS — N939 Abnormal uterine and vaginal bleeding, unspecified: Secondary | ICD-10-CM

## 2016-10-15 DIAGNOSIS — Z79899 Other long term (current) drug therapy: Secondary | ICD-10-CM

## 2016-10-15 DIAGNOSIS — Z0000 Encounter for general adult medical examination without abnormal findings: Secondary | ICD-10-CM

## 2016-10-15 DIAGNOSIS — Z7984 Long term (current) use of oral hypoglycemic drugs: Secondary | ICD-10-CM

## 2016-10-15 DIAGNOSIS — Z87891 Personal history of nicotine dependence: Secondary | ICD-10-CM

## 2016-10-15 DIAGNOSIS — E119 Type 2 diabetes mellitus without complications: Secondary | ICD-10-CM

## 2016-10-15 DIAGNOSIS — Z6841 Body Mass Index (BMI) 40.0 and over, adult: Secondary | ICD-10-CM

## 2016-10-15 DIAGNOSIS — I10 Essential (primary) hypertension: Secondary | ICD-10-CM

## 2016-10-15 LAB — GLUCOSE, CAPILLARY: Glucose-Capillary: 214 mg/dL — ABNORMAL HIGH (ref 65–99)

## 2016-10-15 MED ORDER — SPIRONOLACTONE 25 MG PO TABS: 25.0000 mg | ORAL_TABLET | Freq: Every day | ORAL | 1 refills | Status: DC

## 2016-10-15 NOTE — Assessment & Plan Note (Addendum)
Blood pressure today is uncontrolled, 152/53. Patient was last seen on 09/28/16 at which point the does of her HCTZ was decreased from 25 -> 12.5 mg daily due to side effect of muscle spasms. Per chart review, she is also prescribed losartan 50 mg daily. However, on review of her medication bottles today which she brought into clinic, she is currently taking 20 mg of lisinopril and 25 mg of HCTZ. She reports taking these medicines inconsistently. She has been alternating between taking HCTZ (full 25 mg dose) and metformin, due to side effect of dizziness which she believes is an adverse effect of one of these medications. She describes episodic dizziness that is worse with activity and improves with rest. She reports frequent urination with HCTZ. She has checked her BP at home during these episodes but reports she is always high, 160-170s/100s. It is possible that patient is experiencing excessive diuresis and orthostatic hypotension with HCTZ, however HCTZ has also been reported to cause dizziness unrelated to orthostasis. Today we discussed a trial off HCTZ and patient has agreed. Given her possible concurrent PCOS diagnosis (see abnormal vaginal bleeding A&P), I have advised her to continue with metformin for now. We will add spironolactone for additional BP control. I have advised patient to avoid foods high in potassium as she is also on an ACEi. Plan for follow up in 2 weeks for BMP and BP recheck.  -- Continue lisinopril 20 mg daily  -- Discontinue HCTZ (dizziness)  -- Start spirolactone 25 mg daily  -- Follow up 2 weeks for BMP and BP recheck

## 2016-10-15 NOTE — Assessment & Plan Note (Addendum)
Last A1c on 08/29/16 was 8.2. Metformin was discontinued at her last visit due to side effect of nausea and dizziness. Instead she was prescribed Januvia 100 mg daily. Unfortunately patient has been unable to afford Januvia prescription and has continued to take metformin. She denies any GI side effects, but reports occasional episodic dizziness. She is unsure if this is a side effect of metformin or HCTZ and has been taking both inconsistently. We have agreed to a trial of HCTZ to see if dizziness resolves. We will continue with metformin 500 mg BID for now.  -- Follow up 2 weeks

## 2016-10-15 NOTE — Patient Instructions (Addendum)
Ms. Mccalister,  It was a pleasure to see you today. Please stop taking your hydrochlorothiazide (HCTZ). Instead, please start taking spironolactone 25 mg daily. I have sent a prescription to your [facility] pharmacy, it should be $4. Please continue to take lisinopril and metformin as previously prescribed. Please follow up in 2 weeks for blood work and to have your blood pressure rechecked. I have also referred you to gynecology for your bleeding.  If you have any questions or concerns, call our clinic at [phone] or after hours call [phone] and ask for the internal medicine resident on call. Thank you!  - Dr. Philipp Ovens

## 2016-10-15 NOTE — Assessment & Plan Note (Signed)
Patient walks with a cane and is unable to ambulate more than 200 feet due to diffuse joint pains. Patient is morbidly obese with BMI of 50. She brought in a DMV form today requesting renewal of her handicap parking pass. Today we discussed the importance of weight loss in the setting of her limited mobility and joint pains. She has already met with our diabetic dietician and has expressed a desire to change her diet. DMV form was signed for a temproary parking pass. Will reassess in 6 months and continue to monitor weight.

## 2016-10-15 NOTE — Progress Notes (Signed)
   CC: BP and DM follow up  HPI:  Tina Lara is a 61 y.o. female with past medical history outlined below here for BP and DM follow up. For the details of today's visit, please refer to the assessment and plan.  Past Medical History:  Diagnosis Date  . Anxiety   . Chest pain 08/14/2016  . Complication of anesthesia    I have a high tolerance ,I had toruble witha spinal block not taking "  . Hypertension    never diagnosed by physician per pt  . Obesity     Review of Systems:  Endorses dizziness, denies chest pain and SOB  Physical Exam:  Vitals:   10/15/16 1505  BP: (!) 152/53  Pulse: 92  Temp: (!) 97.5 F (36.4 C)  TempSrc: Oral  SpO2: 100%  Weight: (!) 355 lb 1.6 oz (161.1 kg)  Height: 5\' 10"  (1.778 m)    Constitutional: Obese, NAD, appears comfortable HEENT: Atraumatic, normocephalic. PERRL, anicteric sclera.   Cardiovascular: RRR, no murmurs, rubs, or gallops.  Pulmonary/Chest: CTAB, no wheezes, rales, or rhonchi.  Extremities: Warm and well perfused. No edema.  Neurological: A&Ox3, CN II - XII grossly intact.  Psychiatric: Normal mood and affect  Assessment & Plan:   See Encounters Tab for problem based charting.  Patient discussed with Dr. Daryll Drown

## 2016-10-15 NOTE — Assessment & Plan Note (Addendum)
Patient continues to complain of prolonged menstrual bleeding. Patient likely has PCOS as she is obese with a history of irregular menstrual cycles. She underwent a pelvic ultrasound on 10/04/16 that was very limited due to body habitus with non visualization of the ovaries. She was previously uninsured but now has the orange card and has agreed to gynecology referral. She is already taking metformin 500 mg BID for her diabetes. We are starting spironolactone 50 mg today for better BP control.  -- Gynecology referral  -- Continue metformin  -- Spironolactone 25 mg daily, BMP in 2 weeks

## 2016-10-18 ENCOUNTER — Other Ambulatory Visit: Payer: Self-pay | Admitting: *Deleted

## 2016-10-18 DIAGNOSIS — I10 Essential (primary) hypertension: Secondary | ICD-10-CM

## 2016-10-18 MED ORDER — SPIRONOLACTONE 25 MG PO TABS: 25.0000 mg | ORAL_TABLET | Freq: Every day | ORAL | 1 refills | Status: DC

## 2016-10-18 NOTE — Telephone Encounter (Signed)
Yes, I am aware of interaction. We will be monitoring her potassium closely. Thank you.

## 2016-10-19 NOTE — Progress Notes (Signed)
Internal Medicine Clinic Attending  Case discussed with Dr. Guilloud at the time of the visit.  We reviewed the resident's history and exam and pertinent patient test results.  I agree with the assessment, diagnosis, and plan of care documented in the resident's note.  

## 2016-10-23 ENCOUNTER — Encounter: Payer: Self-pay | Admitting: Obstetrics and Gynecology

## 2016-11-05 ENCOUNTER — Encounter: Payer: Self-pay | Admitting: Internal Medicine

## 2016-11-26 ENCOUNTER — Encounter: Payer: Self-pay | Admitting: Obstetrics and Gynecology

## 2016-11-26 NOTE — Progress Notes (Signed)
Patient did not keep GYN referral appointment for 11/26/2016.  Durene Romans MD Attending Center for [employer])

## 2016-11-27 ENCOUNTER — Encounter: Payer: Self-pay | Admitting: General Practice

## 2016-11-28 NOTE — Addendum Note (Signed)
Addended by: Jodean Lima on: 11/28/2016 08:53 PM   Modules accepted: Orders

## 2016-12-13 NOTE — Addendum Note (Signed)
Addended by: Hulan Fray on: 12/13/2016 06:55 PM   Modules accepted: Orders

## 2016-12-24 ENCOUNTER — Encounter (INDEPENDENT_AMBULATORY_CARE_PROVIDER_SITE_OTHER): Payer: Self-pay

## 2016-12-24 ENCOUNTER — Ambulatory Visit (INDEPENDENT_AMBULATORY_CARE_PROVIDER_SITE_OTHER): Payer: Self-pay | Admitting: Internal Medicine

## 2016-12-24 VITALS — BP 142/76 | HR 78 | Temp 98.1°F | Ht 70.0 in | Wt 353.1 lb

## 2016-12-24 DIAGNOSIS — Z7984 Long term (current) use of oral hypoglycemic drugs: Secondary | ICD-10-CM

## 2016-12-24 DIAGNOSIS — Z87891 Personal history of nicotine dependence: Secondary | ICD-10-CM

## 2016-12-24 DIAGNOSIS — N939 Abnormal uterine and vaginal bleeding, unspecified: Secondary | ICD-10-CM

## 2016-12-24 DIAGNOSIS — Z79899 Other long term (current) drug therapy: Secondary | ICD-10-CM

## 2016-12-24 DIAGNOSIS — K219 Gastro-esophageal reflux disease without esophagitis: Secondary | ICD-10-CM

## 2016-12-24 DIAGNOSIS — I10 Essential (primary) hypertension: Secondary | ICD-10-CM

## 2016-12-24 DIAGNOSIS — E669 Obesity, unspecified: Secondary | ICD-10-CM

## 2016-12-24 DIAGNOSIS — Z6841 Body Mass Index (BMI) 40.0 and over, adult: Secondary | ICD-10-CM

## 2016-12-24 DIAGNOSIS — B351 Tinea unguium: Secondary | ICD-10-CM

## 2016-12-24 DIAGNOSIS — Z9114 Patient's other noncompliance with medication regimen: Secondary | ICD-10-CM

## 2016-12-24 DIAGNOSIS — Z0000 Encounter for general adult medical examination without abnormal findings: Secondary | ICD-10-CM

## 2016-12-24 DIAGNOSIS — N920 Excessive and frequent menstruation with regular cycle: Secondary | ICD-10-CM

## 2016-12-24 DIAGNOSIS — E119 Type 2 diabetes mellitus without complications: Secondary | ICD-10-CM

## 2016-12-24 LAB — GLUCOSE, CAPILLARY: Glucose-Capillary: 136 mg/dL — ABNORMAL HIGH (ref 65–99)

## 2016-12-24 LAB — POCT GLYCOSYLATED HEMOGLOBIN (HGB A1C): Hemoglobin A1C: 7.6

## 2016-12-24 MED ORDER — AMLODIPINE BESYLATE 5 MG PO TABS: 5.0000 mg | ORAL_TABLET | Freq: Every day | ORAL | 2 refills | Status: DC

## 2016-12-24 MED ORDER — PANTOPRAZOLE SODIUM 40 MG PO TBEC: 40.0000 mg | DELAYED_RELEASE_TABLET | Freq: Two times a day (BID) | ORAL | 2 refills | Status: DC

## 2016-12-24 MED ORDER — HYDROCHLOROTHIAZIDE 25 MG PO TABS: 25.0000 mg | ORAL_TABLET | Freq: Every day | ORAL | 2 refills | Status: DC

## 2016-12-24 MED ORDER — HYDROCHLOROTHIAZIDE 25 MG PO TABS
25.0000 mg | ORAL_TABLET | Freq: Every day | ORAL | 2 refills | Status: DC
Start: 2016-12-24 — End: 2016-12-24

## 2016-12-24 NOTE — Assessment & Plan Note (Addendum)
Blood pressure is chronically uncontrolled, and persistently elevated today 162/79 and 142/76 on recheck. At her last visit, HCTZ was discontinued due to side effect of dizziness and patient was started on spironolactone 25 mg daily instead due to concern for PCOS in addition to lisinopril 20. Plan was to follow up in 2 weeks for recheck and BMP, unfortunately patient no showed her last two appointment due to transportation issues. Today she reports she never made the recommended medications changes and has continued to take her previously prescribed HCTZ and lisinopril. She initially reported taking these medications daily, however after further questioning admitted to taking her HCTZ inconsistently (every other day or every 2 days) due to side effects of muscle cramping. This has been an issue in the past and her dose was previously decreased from 25 -> 12.5 mg daily. Due to issues of non compliance and on going side effects, we will discontinue HCTZ today. Plan to start amlodipine 5 mg daily in addition to lisinopril. Follow up 3 months.  -- Start amlodipine 5 mg daily  -- Continue Lisinopril 20 mg daily  -- F/u BMP  ADDENDUM: BMP within normal limits aside from glucose 144.

## 2016-12-24 NOTE — Assessment & Plan Note (Signed)
Declined flu vaccine and PAP smear today.

## 2016-12-24 NOTE — Progress Notes (Signed)
   CC: HTN, DM follow up  HPI:  [name] is a 61 y.o. female with past medical history outlined below here for follow up of her HTN and DM. For the details of today's visit, please refer to the assessment and plan.  Past Medical History:  Diagnosis Date  . Anxiety   . Chest pain 08/14/2016  . Complication of anesthesia    I have a high tolerance ,I had toruble witha spinal block not taking "  . Hypertension    never diagnosed by physician per pt  . Obesity     Review of Systems:  Denies dizziness, lightheadedness. Endorses occasional muscle cramps.   Physical Exam:  Vitals:   12/24/16 1603 12/24/16 1621  BP: (!) 162/79 (!) 142/76  Pulse: 84 78  Temp: 98.1 F (36.7 C)   TempSrc: Oral   Weight: (!) 353 lb 1.6 oz (160.2 kg)   Height: 5\' 10"  (1.778 m)     Constitutional: Obese, NAD, appears comfortable  Cardiovascular: RRR Pulmonary/Chest: CTAB Extremities: Warm and well perfused, pulses intact. No edema. Diabetic foot exam with onychomycosis, otherwise normal (see quality metrics tab) Skin: No rashes or erythema  Psychiatric: Normal mood and affect  Assessment & Plan:   See Encounters Tab for problem based charting.  Patient discussed with Dr. Evette Doffing

## 2016-12-24 NOTE — Patient Instructions (Addendum)
Ms. Lehane,  It was a pleasure to see you today. For your blood pressure, please stop taking hydrochlorothiazide. Please take amlodipine 5 mg daily instead in addition to your lisinopril. I have referred you to the gynecologist for your abnormal bleeding. You will be contacted for this appointment. Please follow up with me in 3 months or sooner if needed. If you have any questions or concerns, call our clinic at [phone] or after hours call [phone] and ask for the internal medicine resident on call. Thank you!   - Dr. Philipp Ovens

## 2016-12-24 NOTE — Assessment & Plan Note (Signed)
Patient reports compliance with her metformin 500 mg BID. She has also made some positive dietary changes and has lost 6 lbs in the past couple of months. I encouraged patient to keep up the good work. A1c has decreased from 8.2 four months ago to 7.6 today.  -- Continue with diet and lifestyle changes  -- Continue metformin 500 mg BID -- F/u 3 months -- Foot exam today was normal

## 2016-12-24 NOTE — Assessment & Plan Note (Signed)
Patient continues to have prolonged, abnormal menstrual bleeding. She was previously referred to gynecology unfortunately no showed her appointment last month. Patient reports she was unaware of the appointment and does not have voicemail on her phone. I have updated patient's contact info to include her daughter's number who she prefers we contact for scheduling appointments.  -- Declined PAP today due to current bleeding  -- Re-referral to gynecology  -- Please contact daughter Bary Castilla for scheduling purposes

## 2016-12-24 NOTE — Assessment & Plan Note (Signed)
Refilled Protonix 40 mg BID.

## 2016-12-25 LAB — BMP8+ANION GAP
Anion Gap: 12 mmol/L (ref 10.0–18.0)
BUN/Creatinine Ratio: 10 (ref 9–23)
BUN: 9 mg/dL (ref 6–24)
CO2: 26 mmol/L (ref 20–29)
Calcium: 9 mg/dL (ref 8.7–10.2)
Chloride: 102 mmol/L (ref 96–106)
Creatinine, Ser: 0.86 mg/dL (ref 0.57–1.00)
GFR calc Af Amer: 86 mL/min/{1.73_m2} (ref >59)
GFR calc non Af Amer: 75 mL/min/{1.73_m2} (ref >59)
Glucose: 144 mg/dL — ABNORMAL HIGH (ref 65–99)
Potassium: 4.7 mmol/L (ref 3.5–5.2)
Sodium: 140 mmol/L (ref 134–144)

## 2016-12-25 NOTE — Progress Notes (Signed)
Internal Medicine Clinic Attending  Case discussed with Dr. Guilloud at the time of the visit.  We reviewed the resident's history and exam and pertinent patient test results.  I agree with the assessment, diagnosis, and plan of care documented in the resident's note.  

## 2016-12-27 ENCOUNTER — Other Ambulatory Visit: Payer: Self-pay

## 2016-12-27 DIAGNOSIS — I10 Essential (primary) hypertension: Secondary | ICD-10-CM

## 2016-12-27 NOTE — Telephone Encounter (Signed)
amLODipine (NORVASC) 5 MG tablet  pantoprazole (PROTONIX) 40 MG tablet, per patient this meds is too expensive. Requesting this meds to be sent to health department. Please call pt back.

## 2016-12-30 MED ORDER — AMLODIPINE BESYLATE 5 MG PO TABS: 5.0000 mg | ORAL_TABLET | Freq: Every day | ORAL | 2 refills | Status: DC

## 2016-12-30 MED ORDER — PANTOPRAZOLE SODIUM 40 MG PO TBEC: 40.0000 mg | DELAYED_RELEASE_TABLET | Freq: Two times a day (BID) | ORAL | 2 refills | Status: DC

## 2017-02-04 ENCOUNTER — Encounter: Payer: Self-pay | Admitting: Advanced Practice Midwife

## 2017-02-04 ENCOUNTER — Other Ambulatory Visit (HOSPITAL_COMMUNITY)
Admission: RE | Admit: 2017-02-04 | Discharge: 2017-02-04 | Disposition: A | Discharge status: Home / Self Care | Payer: Self-pay | Source: Ambulatory Visit | Attending: Advanced Practice Midwife | Admitting: Advanced Practice Midwife

## 2017-02-04 ENCOUNTER — Ambulatory Visit: Payer: Self-pay | Admitting: Advanced Practice Midwife

## 2017-02-04 VITALS — BP 204/111 | HR 83 | Ht 70.0 in | Wt 358.3 lb

## 2017-02-04 DIAGNOSIS — N939 Abnormal uterine and vaginal bleeding, unspecified: Secondary | ICD-10-CM

## 2017-02-04 DIAGNOSIS — N950 Postmenopausal bleeding: Secondary | ICD-10-CM

## 2017-02-04 DIAGNOSIS — Z1151 Encounter for screening for human papillomavirus (HPV): Secondary | ICD-10-CM

## 2017-02-04 DIAGNOSIS — Z124 Encounter for screening for malignant neoplasm of cervix: Secondary | ICD-10-CM

## 2017-02-04 DIAGNOSIS — N841 Polyp of cervix uteri: Secondary | ICD-10-CM

## 2017-02-04 NOTE — Progress Notes (Signed)
Subjective:     Patient ID: Tina Lara, female   DOB: [birth year], 61 y.o.   MRN: [id number]  Tina Lara is a 61 y.o. Who presents today with vaginal bleeding. She states that about 15 years ago she stopped bleeding for 10 years. Since 2014 she has had spotting off and on about every 2 weeks. Normal pelvic US on 10/04/16.    Vaginal Bleeding  The patient's primary symptoms include vaginal bleeding. This is a new problem. The current episode started more than 1 year ago. The problem occurs intermittently. The problem has been unchanged. The pain is severe (8/10, but ibuprofen helps with the pain when she takes it. ). The problem affects both sides. She is not pregnant. Pertinent negatives include no chills, fever or headaches. The vaginal bleeding is spotting. She has not been passing clots. She has not been passing tissue. Nothing aggravates the symptoms. She has tried NSAIDs for the symptoms. The treatment provided moderate relief. She is postmenopausal.     Review of Systems  Constitutional: Negative for chills and fever.  Eyes: Negative for visual disturbance.  Cardiovascular: Negative for chest pain.  Genitourinary: Positive for vaginal bleeding.  Neurological: Negative for headaches.       Objective:   Physical Exam  Constitutional: She is oriented to person, place, and time. She appears well-developed and well-nourished.  HENT:  Head: Normocephalic.  Cardiovascular: Normal rate.  Pulmonary/Chest: Effort normal.  Abdominal: Soft. There is no tenderness. There is no rebound.  Genitourinary:  Genitourinary Comments:  External: no lesion Vagina: small amount of blood seen  Cervix: pink, large polyp on the cervix  Uterus: difficult to assess 2/2 body habitus    Neurological: She is alert and oriented to person, place, and time.  Skin: Skin is warm and dry.  Psychiatric: She has a normal mood and affect.  Nursing note and vitals reviewed.     Patient reports that  she did not take her blood pressure medication today. Advised patient that she needs to take her medication or call her PCP for any worsening sx.   Consent obtained, and time out performed. Patient in lithotomy position. Speculum inserted. Large polyp noted at the cervix. Dr. Rip Harbour called to the room to assess and assist. He was able to remove the polyp with ring forceps and gentle traction. Submitted to path. Endometrial biopsy obtained. Pap obtained.   Assessment:     1. Abnormal uterine bleeding   2. Postmenopausal bleeding   3. Cervical polyp        Plan:     Polyp submitted to path Endometrial sample collected and sent to path Pap collected Mammogram needed. Patient does not have insurance. Mammogram scholarship form given. Patient would like to complete this at home, and return at her convenience .      Tina Lara 4:36 PM 02/04/17

## 2017-02-04 NOTE — Patient Instructions (Signed)
Endometrial Biopsy, Care After This sheet gives you information about how to care for yourself after your procedure. Your health care provider may also give you more specific instructions. If you have problems or questions, contact your health care provider. What can I expect after the procedure? After the procedure, it is common to have:  Mild cramping.  A small amount of vaginal bleeding for a few days. This is normal.  Follow these instructions at home:  Take over-the-counter and prescription medicines only as told by your health care provider.  Do not douche, use tampons, or have sexual intercourse until your health care provider approves.  Return to your normal activities as told by your health care provider. Ask your health care provider what activities are safe for you.  Follow instructions from your health care provider about any activity restrictions, such as restrictions on strenuous exercise or heavy lifting. Contact a health care provider if:  You have heavy bleeding, or bleed for longer than 2 days after the procedure.  You have bad smelling discharge from your vagina.  You have a fever or chills.  You have a burning sensation when urinating or you have difficulty urinating.  You have severe pain in your lower abdomen. Get help right away if:  You have severe cramps in your stomach or back.  You pass large blood clots.  Your bleeding increases.  You become weak or light-headed, or you pass out. Summary  After the procedure, it is common to have mild cramping and a small amount of vaginal bleeding for a few days.  Do not douche, use tampons, or have sexual intercourse until your health care provider approves.  Return to your normal activities as told by your health care provider. Ask your health care provider what activities are safe for you. This information is not intended to replace advice given to you by your health care provider. Make sure you discuss any  questions you have with your health care provider. Document Released: 01/07/2013 Document Revised: 04/04/2016 Document Reviewed: 04/04/2016 Elsevier Interactive Patient Education  2017 [place]. Endometrial Biopsy Endometrial biopsy is a procedure in which a tissue sample is taken from inside the uterus. The sample is taken from the endometrium, which is the lining of the uterus. The tissue sample is then checked under a microscope to see if the tissue is normal or abnormal. This procedure helps to determine where you are in your menstrual cycle and how hormone levels are affecting the lining of the uterus. This procedure may also be used to evaluate uterine bleeding or to diagnose endometrial cancer, endometrial tuberculosis, polyps, or other inflammatory conditions. Tell a health care provider about:  Any allergies you have.  All medicines you are taking, including vitamins, herbs, eye drops, creams, and over-the-counter medicines.  Any problems you or family members have had with anesthetic medicines.  Any blood disorders you have.  Any surgeries you have had.  Any medical conditions you have.  Whether you are pregnant or may be pregnant. What are the risks? Generally, this is a safe procedure. However, problems may occur, including:  Bleeding.  Pelvic infection.  Puncture of the wall of the uterus with the biopsy device (rare).  What happens before the procedure?  Keep a record of your menstrual cycles as told by your health care provider. You may need to schedule your procedure for a specific time in your cycle.  You may want to bring a sanitary pad to wear after the procedure.  Ask your health care provider about: ? Changing or stopping your regular medicines. This is especially important if you are taking diabetes medicines or blood thinners. ? Taking medicines such as aspirin and ibuprofen. These medicines can thin your blood. Do not take these medicines before your  procedure if your health care provider instructs you not to.  Plan to have someone take you home from the hospital or clinic. What happens during the procedure?  To lower your risk of infection: ? Your health care team will wash or sanitize their hands.  You will lie on an exam table with your feet and legs supported as in a pelvic exam.  Your health care provider will insert an instrument (speculum) into your vagina to see your cervix.  Your cervix will be cleansed with an antiseptic solution.  A medicine (local anesthetic) will be used to numb the cervix.  A forceps instrument (tenaculum) will be used to hold your cervix steady for the biopsy.  A thin, rod-like instrument (uterine sound) will be inserted through your cervix to determine the length of your uterus and the location where the biopsy sample will be removed.  A thin, flexible tube (catheter) will be inserted through your cervix and into the uterus. The catheter will be used to collect the biopsy sample from your endometrial tissue.  The catheter and speculum will then be removed, and the tissue sample will be sent to a lab for examination. What happens after the procedure?  You will rest in a recovery area until you are ready to go home.  You may have mild cramping and a small amount of vaginal bleeding. This is normal.  It is up to you to get the results of your procedure. Ask your health care provider, or the department that is doing the procedure, when your results will be ready. Summary  Endometrial biopsy is a procedure in which a tissue sample is taken from the endometrium, which is the lining of the uterus.  This procedure may help to diagnose menstrual cycle problems, abnormal bleeding, or other conditions affecting the endometrium.  Before the procedure, keep a record of your menstrual cycles as told by your health care provider.  The tissue sample that is removed will be checked under a microscope to see  if it is normal or abnormal. This information is not intended to replace advice given to you by your health care provider. Make sure you discuss any questions you have with your health care provider. Document Released: 07/20/2004 Document Revised: 04/04/2016 Document Reviewed: 04/04/2016 Elsevier Interactive Patient Education  2017 [employer]  

## 2017-02-04 NOTE — Progress Notes (Signed)
Pt stated having heavy bleeding/cramping for about 2 weeks.

## 2017-02-07 LAB — CYTOLOGY - PAP: HPV: NOT DETECTED

## 2017-02-25 ENCOUNTER — Ambulatory Visit (INDEPENDENT_AMBULATORY_CARE_PROVIDER_SITE_OTHER): Payer: Self-pay | Admitting: Obstetrics & Gynecology

## 2017-02-25 ENCOUNTER — Encounter: Payer: Self-pay | Admitting: Obstetrics & Gynecology

## 2017-02-25 VITALS — BP 108/80 | Wt 360.2 lb

## 2017-02-25 DIAGNOSIS — N950 Postmenopausal bleeding: Secondary | ICD-10-CM

## 2017-02-25 NOTE — Progress Notes (Signed)
   Subjective:    Patient ID: Tina Lara, female    DOB: 10-08-1955, 61 y.o.   MRN: [id number]  HPI 61 yo lady here for results of her embx and cervical polyp removal. She was initially seen here for PMB. A cervical polyp was noted and removed. An EMBX was done. She had had an u/s done 7/18 that showed a 8 mm endometrial lining.   Review of Systems     Objective:   Physical Exam Breathing, conversing, and ambulating normally Well nourished, well hydrated Blackfemale, no apparent distress  Notes recorded by Tresea Mall, CNM on 02/09/2017 at 10:52 AM EST Patient with AGUS on pap, will need to make sure we can do colpo at her visit on 11/26.  Contains abnormal data Cytology - PAP  Order: [id number]   Status:  Edited Result - FINAL Visible to patient:  No (Not Released) Next appt:  05/06/2017 at 03:15 PM in Internal Medicine Velna Ochs, MD) Dx:  Abnormal uterine bleeding; Postmenopa...  Component 3wk ago  Adequacy Satisfactory for evaluation endocervical/transformation zone component PRESENT. Abnormal  VC  Diagnosis ATYPICAL GLANDULAR CELLS. Abnormal  VC  HPV NOT DETECTED VC       Diagnosis 1. 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 Cervix, polyp - ATYPICAL HYPERPLASIA ARISING IN A ENDOMETRIAL POLYP - SEE COMMENT Microscopic Comment    Assessment & Plan:  With the pap showing AGCUS, she will need a colpo. She is "not prepared" to do it today and will schedule this in the near future. I have sent Dr. Everitt Amber an email for her opinion on this case.

## 2017-04-16 ENCOUNTER — Telehealth: Payer: Self-pay | Admitting: Obstetrics & Gynecology

## 2017-04-16 NOTE — Telephone Encounter (Signed)
Called and LVM-to call the office back regarding the lab results and need to schedule an  Appointment for colpo.

## 2017-05-06 ENCOUNTER — Other Ambulatory Visit: Payer: Self-pay

## 2017-05-06 ENCOUNTER — Encounter: Payer: Self-pay | Admitting: Internal Medicine

## 2017-05-06 ENCOUNTER — Ambulatory Visit (INDEPENDENT_AMBULATORY_CARE_PROVIDER_SITE_OTHER): Payer: Medicare HMO | Admitting: Internal Medicine

## 2017-05-06 VITALS — BP 150/69 | HR 87 | Temp 98.5°F | Wt 356.3 lb

## 2017-05-06 DIAGNOSIS — I10 Essential (primary) hypertension: Secondary | ICD-10-CM | POA: Diagnosis not present

## 2017-05-06 DIAGNOSIS — R87610 Atypical squamous cells of undetermined significance on cytologic smear of cervix (ASC-US): Secondary | ICD-10-CM

## 2017-05-06 DIAGNOSIS — Z6841 Body Mass Index (BMI) 40.0 and over, adult: Secondary | ICD-10-CM

## 2017-05-06 DIAGNOSIS — Z736 Limitation of activities due to disability: Secondary | ICD-10-CM

## 2017-05-06 DIAGNOSIS — Z87891 Personal history of nicotine dependence: Secondary | ICD-10-CM | POA: Diagnosis not present

## 2017-05-06 DIAGNOSIS — E119 Type 2 diabetes mellitus without complications: Secondary | ICD-10-CM | POA: Diagnosis not present

## 2017-05-06 DIAGNOSIS — Z7984 Long term (current) use of oral hypoglycemic drugs: Secondary | ICD-10-CM

## 2017-05-06 DIAGNOSIS — Z79899 Other long term (current) drug therapy: Secondary | ICD-10-CM | POA: Diagnosis not present

## 2017-05-06 DIAGNOSIS — K219 Gastro-esophageal reflux disease without esophagitis: Secondary | ICD-10-CM

## 2017-05-06 DIAGNOSIS — Z0000 Encounter for general adult medical examination without abnormal findings: Secondary | ICD-10-CM

## 2017-05-06 LAB — POCT GLYCOSYLATED HEMOGLOBIN (HGB A1C): Hemoglobin A1C: 8.3

## 2017-05-06 LAB — GLUCOSE, CAPILLARY: Glucose-Capillary: 133 mg/dL — ABNORMAL HIGH (ref 65–99)

## 2017-05-06 MED ORDER — METFORMIN HCL 1000 MG PO TABS: 1000.0000 mg | ORAL_TABLET | Freq: Two times a day (BID) | ORAL | 3 refills | Status: DC

## 2017-05-06 MED ORDER — PANTOPRAZOLE SODIUM 40 MG PO TBEC: 40.0000 mg | DELAYED_RELEASE_TABLET | Freq: Every day | ORAL | 2 refills | Status: DC

## 2017-05-06 MED ORDER — HYDROCHLOROTHIAZIDE 25 MG PO TABS: 25.0000 mg | ORAL_TABLET | Freq: Every day | ORAL | 2 refills | Status: DC

## 2017-05-06 MED ORDER — LISINOPRIL 20 MG PO TABS: 20.0000 mg | ORAL_TABLET | Freq: Every day | ORAL | 2 refills | Status: DC

## 2017-05-06 MED ORDER — ATORVASTATIN CALCIUM 20 MG PO TABS: 20.0000 mg | ORAL_TABLET | Freq: Every day | ORAL | 11 refills | Status: DC

## 2017-05-06 NOTE — Patient Instructions (Addendum)
Tina Lara,  It was a pleasure to see you today. You may stop taking your daily aspirin. Please continue to take lisinopril and hydrochlorothiazide daily, I have sent refills to your pharmacy. I have also increased the dose of your metformin to 1000 mg twice a day. Please follow up with [place] in 2 weeks for a blood pressure recheck. It is also important that you follow up with your gynecologist for your abnormal PAP smear. I have placed referrals for a mammogram for breast cancer screening and colonoscopy for colon cancer screening, you will be contacted to schedule these. If you have any questions or concerns, call our clinic at [phone] or after hours call [phone] and ask for the internal medicine resident on call. Thank you!  - Dr. Philipp Ovens   Diabetes Mellitus and Nutrition When you have diabetes (diabetes mellitus), it is very important to have healthy eating habits because your blood sugar (glucose) levels are greatly affected by what you eat and drink. Eating healthy foods in the appropriate amounts, at about the same times every day, can help you:  Control your blood glucose.  Lower your risk of heart disease.  Improve your blood pressure.  Reach or maintain a healthy weight.  Every person with diabetes is different, and each person has different needs for a meal plan. Your health care provider may recommend that you work with a diet and nutrition specialist (dietitian) to make a meal plan that is best for you. Your meal plan may vary depending on factors such as:  The calories you need.  The medicines you take.  Your weight.  Your blood glucose, blood pressure, and cholesterol levels.  Your activity level.  Other health conditions you have, such as heart or kidney disease.  How do carbohydrates affect me? Carbohydrates affect your blood glucose level more than any other type of food. Eating carbohydrates naturally increases the amount of glucose in your blood.  Carbohydrate counting is a method for keeping track of how many carbohydrates you eat. Counting carbohydrates is important to keep your blood glucose at a healthy level, especially if you use insulin or take certain oral diabetes medicines. It is important to know how many carbohydrates you can safely have in each meal. This is different for every person. Your dietitian can help you calculate how many carbohydrates you should have at each meal and for snack. Foods that contain carbohydrates include:  Bread, cereal, rice, pasta, and crackers.  Potatoes and corn.  Peas, beans, and lentils.  Milk and yogurt.  Fruit and juice.  Desserts, such as cakes, cookies, ice cream, and candy.  How does alcohol affect me? Alcohol can cause a sudden decrease in blood glucose (hypoglycemia), especially if you use insulin or take certain oral diabetes medicines. Hypoglycemia can be a life-threatening condition. Symptoms of hypoglycemia (sleepiness, dizziness, and confusion) are similar to symptoms of having too much alcohol. If your health care provider says that alcohol is safe for you, follow these guidelines:  Limit alcohol intake to no more than 1 drink per day for nonpregnant women and 2 drinks per day for men. One drink equals 12 oz of beer, 5 oz of wine, or 1 oz of hard liquor.  Do not drink on an empty stomach.  Keep yourself hydrated with water, diet soda, or unsweetened iced tea.  Keep in mind that regular soda, juice, and other mixers may contain a lot of sugar and must be counted as carbohydrates.  What are tips for following  this plan? Reading food labels  Start by checking the serving size on the label. The amount of calories, carbohydrates, fats, and other nutrients listed on the label are based on one serving of the food. Many foods contain more than one serving per package.  Check the total grams (g) of carbohydrates in one serving. You can calculate the number of servings of  carbohydrates in one serving by dividing the total carbohydrates by 15. For example, if a food has 30 g of total carbohydrates, it would be equal to 2 servings of carbohydrates.  Check the number of grams (g) of saturated and trans fats in one serving. Choose foods that have low or no amount of these fats.  Check the number of milligrams (mg) of sodium in one serving. Most people should limit total sodium intake to less than 2,300 mg per day.  Always check the nutrition information of foods labeled as "low-fat" or "nonfat". These foods may be higher in added sugar or refined carbohydrates and should be avoided.  Talk to your dietitian to identify your daily goals for nutrients listed on the label. Shopping  Avoid buying canned, premade, or processed foods. These foods tend to be high in fat, sodium, and added sugar.  Shop around the outside edge of the grocery store. This includes fresh fruits and vegetables, bulk grains, fresh meats, and fresh dairy. Cooking  Use low-heat cooking methods, such as baking, instead of high-heat cooking methods like deep frying.  Cook using healthy oils, such as olive, canola, or sunflower oil.  Avoid cooking with butter, cream, or high-fat meats. Meal planning  Eat meals and snacks regularly, preferably at the same times every day. Avoid going long periods of time without eating.  Eat foods high in fiber, such as fresh fruits, vegetables, beans, and whole grains. Talk to your dietitian about how many servings of carbohydrates you can eat at each meal.  Eat 4-6 ounces of lean protein each day, such as lean meat, chicken, fish, eggs, or tofu. 1 ounce is equal to 1 ounce of meat, chicken, or fish, 1 egg, or 1/4 cup of tofu.  Eat some foods each day that contain healthy fats, such as avocado, nuts, seeds, and fish. Lifestyle   Check your blood glucose regularly.  Exercise at least 30 minutes 5 or more days each week, or as told by your health care  provider.  Take medicines as told by your health care provider.  Do not use any products that contain nicotine or tobacco, such as cigarettes and e-cigarettes. If you need help quitting, ask your health care provider.  Work with a Social worker or diabetes educator to identify strategies to manage stress and any emotional and social challenges. What are some questions to ask my health care provider?  Do I need to meet with a diabetes educator?  Do I need to meet with a dietitian?  What number can I call if I have questions?  When are the best times to check my blood glucose? Where to find more information:  American Diabetes Association: diabetes.org/food-and-fitness/food  Academy of Nutrition and Dietetics: PokerClues.dk  [employer] of Diabetes and Digestive and Kidney Diseases (NIH): ContactWire.be Summary  A healthy meal plan will help you control your blood glucose and maintain a healthy lifestyle.  Working with a diet and nutrition specialist (dietitian) can help you make a meal plan that is best for you.  Keep in mind that carbohydrates and alcohol have immediate effects on your blood glucose  levels. It is important to count carbohydrates and to use alcohol carefully. This information is not intended to replace advice given to you by your health care provider. Make sure you discuss any questions you have with your health care provider. Document Released: 12/14/2004 Document Revised: 04/23/2016 Document Reviewed: 04/23/2016 Elsevier Interactive Patient Education  [employer].

## 2017-05-06 NOTE — Progress Notes (Signed)
   CC: HTN, DM follow up  HPI:  [name] is a 62 y.o. female with past medical history outlined below here for follow up of HTN and DM. For the details of today's visit, please refer to the assessment and plan.  Past Medical History:  Diagnosis Date  . Anxiety   . Chest pain 08/14/2016  . Complication of anesthesia    I have a high tolerance ,I had toruble witha spinal block not taking "  . Hypertension    never diagnosed by physician per pt  . Obesity     Review of Systems  Constitutional: Negative for chills and fever.    Physical Exam:  Vitals:   05/06/17 1526  BP: (!) 150/69  Pulse: 87  Temp: 98.5 F (36.9 C)  TempSrc: Oral  SpO2: 100%  Weight: (!) 356 lb 4.8 oz (161.6 kg)    Constitutional: Morbidly obese, NAD, appears comfortable Cardiovascular: RRR, no murmurs, rubs, or gallops.  Pulmonary/Chest: CTAB but limited due to habitus, no wheezes, rales, or rhonchi.  Extremities: Warm and well perfused. Distal pulses intact. No edema.  Neurological: A&Ox3, CN II - XII grossly intact.  Skin: No rashes or erythema  Psychiatric: Normal mood and affect  Assessment & Plan:   See Encounters Tab for problem based charting.  Patient discussed with Dr. Angelia Mould

## 2017-05-07 ENCOUNTER — Encounter: Payer: Self-pay | Admitting: Internal Medicine

## 2017-05-07 DIAGNOSIS — R87619 Unspecified abnormal cytological findings in specimens from cervix uteri: Secondary | ICD-10-CM | POA: Insufficient documentation

## 2017-05-07 NOTE — Assessment & Plan Note (Addendum)
A1c today is 8.3, up from 7.6 at her last check five months ago. Today we discussed at length the importance of dietary changes and exercise. She is on permanent disability (? unclear for what medical issue) and walks with a cane. She is mostly non ambulatory but does walk around her house. We discussed limiting carbohydrates and I provided her with a handout on diabetic diet and foods to avoid that are high in carbs. We will increase metformin to 1000 mg BID today. If she is persistently uncontrolled at future visits, consider adding GLP-1 agonist to her regimen.  -- Increase metformin to 1000 mg BID -- F/u 3 months

## 2017-05-07 NOTE — Assessment & Plan Note (Signed)
Referral for mammogram and colonoscopy placed. Declined flu and pneumovax.

## 2017-05-07 NOTE — Progress Notes (Signed)
Internal Medicine Clinic Attending  Case discussed with Dr. Guilloud at the time of the visit.  We reviewed the resident's history and exam and pertinent patient test results.  I agree with the assessment, diagnosis, and plan of care documented in the resident's note.  

## 2017-05-07 NOTE — Assessment & Plan Note (Addendum)
Blood pressure is chronically uncontrolled, and persistently elevated today 150/69. Patient has old prescription bottles with her today, and reports she ran out of all of her medications one week ago. At her last visit, amlodipine 5 was added to her lisinopril 20 daily for better control. Today, she has an empty bottle of HCTZ 25 mg tablets with her. Reports she has been taking this with her lisinopril, not amlodipine. Unfortunately she did not follow up appropriately. We had previously discontinued HCTZ due to side effect of dizziness, but she says she has been tolerating this well now without issue. We will continue this regimen for now, she is agreeable to following up in [place] in 2 weeks for BP recheck. -- Refilled lisinopril 20 daily and HCTZ 25 -- F/u [facility] 2 weeks for BP recheck

## 2017-05-07 NOTE — Assessment & Plan Note (Signed)
Patient was previously referred to gynecology for abnormal vaginal bleeding, underwent PAP smear and found to have a cervical polyp that was removed. PAP smear resulted as AGCUS. Unfortunately patient refused colposcopy and has not followed up. Today I discussed these results with her and the rational for follow up colposcopy. I also provided her a patient handout on AGCUS. She is agreeable to following up with gynecology now for biopsy.  -- F/u gynecology

## 2017-05-10 ENCOUNTER — Ambulatory Visit: Payer: Self-pay | Admitting: Obstetrics & Gynecology

## 2017-05-13 ENCOUNTER — Encounter: Payer: Self-pay | Admitting: *Deleted

## 2017-05-17 ENCOUNTER — Telehealth: Payer: Self-pay | Admitting: Internal Medicine

## 2017-05-17 NOTE — Telephone Encounter (Signed)
Patient is wanting to know if someone call her in something for a boil

## 2017-05-20 ENCOUNTER — Ambulatory Visit (INDEPENDENT_AMBULATORY_CARE_PROVIDER_SITE_OTHER): Payer: Medicare HMO | Admitting: Internal Medicine

## 2017-05-20 ENCOUNTER — Ambulatory Visit (INDEPENDENT_AMBULATORY_CARE_PROVIDER_SITE_OTHER): Payer: Medicare HMO | Admitting: Obstetrics and Gynecology

## 2017-05-20 ENCOUNTER — Encounter: Payer: Self-pay | Admitting: Internal Medicine

## 2017-05-20 ENCOUNTER — Encounter: Payer: Self-pay | Admitting: Obstetrics and Gynecology

## 2017-05-20 VITALS — BP 180/76 | HR 80 | Wt 353.0 lb

## 2017-05-20 VITALS — BP 178/75 | HR 85 | Temp 98.1°F | Ht 70.0 in | Wt 357.9 lb

## 2017-05-20 DIAGNOSIS — Z79899 Other long term (current) drug therapy: Secondary | ICD-10-CM

## 2017-05-20 DIAGNOSIS — N764 Abscess of vulva: Secondary | ICD-10-CM

## 2017-05-20 DIAGNOSIS — I10 Essential (primary) hypertension: Secondary | ICD-10-CM

## 2017-05-20 DIAGNOSIS — R896 Abnormal cytological findings in specimens from other organs, systems and tissues: Secondary | ICD-10-CM

## 2017-05-20 MED ORDER — SULFAMETHOXAZOLE-TRIMETHOPRIM 800-160 MG PO TABS: 1.0000 | ORAL_TABLET | Freq: Two times a day (BID) | ORAL | 0 refills | Status: AC

## 2017-05-20 NOTE — Addendum Note (Signed)
Addended by: Vivien Rota on: 05/20/2017 12:22 PM   Modules accepted: Orders

## 2017-05-20 NOTE — Addendum Note (Signed)
Addended by: Valentina Lucks on: 05/20/2017 04:30 PM   Modules accepted: Orders

## 2017-05-20 NOTE — Progress Notes (Signed)
   CC: "Boil in my vagina"  HPI:  [name] is a 62 y.o. female with a past medical history of conditions listed below presenting to the clinic complaining of pain and swelling of her labia majora. Please see problem based charting for the status of the patient's current and chronic medical conditions.   Past Medical History:  Diagnosis Date  . Anxiety   . Chest pain 08/14/2016  . Complication of anesthesia    I have a high tolerance ,I had toruble witha spinal block not taking "  . Hypertension    never diagnosed by physician per pt  . Obesity    Review of Systems: Pertinent positives mentioned in HPI. Remainder of all ROS negative.   Physical Exam:  Vitals:   05/20/17 0904  BP: (!) 178/75  Pulse: 85  Temp: 98.1 F (36.7 C)  TempSrc: Oral  SpO2: 99%  Weight: (!) 357 lb 14.4 oz (162.3 kg)  Height: 5\' 10"  (1.778 m)   Physical Exam  Constitutional: She is oriented to person, place, and time. She appears well-developed and well-nourished. No distress.  HENT:  Head: Normocephalic and atraumatic.  Mouth/Throat: Oropharynx is clear and moist.  Eyes: Right eye exhibits no discharge. Left eye exhibits no discharge.  Cardiovascular: Normal rate, regular rhythm and intact distal pulses.  Pulmonary/Chest: Effort normal and breath sounds normal. No respiratory distress. She has no wheezes. She has no rales.  Abdominal: Soft. Bowel sounds are normal. She exhibits no distension. There is no tenderness.  Genitourinary:  Genitourinary Comments: Chaperone present in the room. Examination of external genitalia revealed very large painful, indurated area covering the majority of the labia majora on the right. Scant amount of blood and pus noted to be draining.   Musculoskeletal: She exhibits no edema.  Neurological: She is alert and oriented to person, place, and time.  Skin: Skin is warm and dry.    Assessment & Plan:   See Encounters Tab for problem based  charting.  Patient discussed with Dr. Dareen Piano

## 2017-05-20 NOTE — Progress Notes (Signed)
GYNECOLOGY OFFICE VISIT NOTE  History:  62 y.o. here today for labial boil. Started on Friday and got much worse, she has been putting Boil Ease on it. Started draining yesterday and is feeling much better but thought she should come in. Has had labial boils in past but none recently. Denies other complaints.   Past Medical History:  Diagnosis Date  . Anxiety   . Chest pain 08/14/2016  . Complication of anesthesia    I have a high tolerance ,I had toruble witha spinal block not taking "  . Hypertension    never diagnosed by physician per pt  . Obesity     Past Surgical History:  Procedure Laterality Date  . CESAREAN SECTION     x2     Current Outpatient Medications:  .  acetaminophen (TYLENOL) 500 MG tablet, Take 1,000 mg by mouth every 6 (six) hours as needed for moderate pain., Disp: , Rfl:  .  aspirin 81 MG EC tablet, Take 1 tablet (81 mg total) by mouth daily., Disp: 30 tablet, Rfl: 0 .  atorvastatin (LIPITOR) 20 MG tablet, Take 1 tablet (20 mg total) by mouth daily., Disp: 30 tablet, Rfl: 11 .  diclofenac sodium (VOLTAREN) 1 % GEL, Apply 4 g topically 4 (four) times daily., Disp: 100 g, Rfl: 2 .  hydrochlorothiazide (HYDRODIURIL) 25 MG tablet, Take 1 tablet (25 mg total) by mouth daily., Disp: 30 tablet, Rfl: 2 .  ibuprofen (ADVIL,MOTRIN) 200 MG tablet, Take 400 mg by mouth every 6 (six) hours as needed for headache or moderate pain., Disp: , Rfl:  .  lisinopril (PRINIVIL,ZESTRIL) 20 MG tablet, Take 1 tablet (20 mg total) by mouth daily., Disp: 30 tablet, Rfl: 2 .  metFORMIN (GLUCOPHAGE) 1000 MG tablet, Take 1 tablet (1,000 mg total) by mouth 2 (two) times daily with a meal., Disp: 60 tablet, Rfl: 3 .  pantoprazole (PROTONIX) 40 MG tablet, Take 1 tablet (40 mg total) by mouth daily., Disp: 30 tablet, Rfl: 2  The following portions of the patient's history were reviewed and updated as appropriate: allergies, current medications, past family history, past medical history,  past social history, past surgical history and problem list.    Review of Systems:  Pertinent items noted in HPI and remainder of comprehensive ROS otherwise negative.   Objective:  Physical Exam BP (!) 180/76   Pulse 80   Wt (!) 353 lb (160.1 kg)   LMP 04/05/2011   BMI 50.65 kg/m  CONSTITUTIONAL: Well-developed, well-nourished female in no acute distress. Morbidly obese HENT:  Normocephalic, atraumatic. External right and left ear normal. Oropharynx is clear and moist EYES: Conjunctivae and EOM are normal. Pupils are equal, round, and reactive to light. No scleral icterus.  NECK: Normal range of motion, supple, no masses SKIN: Skin is warm and dry. No rash noted. Not diaphoretic. No erythema. No pallor. NEUROLOGIC: Alert and oriented to person, place, and time. Normal reflexes, muscle tone coordination. No cranial nerve deficit noted. PSYCHIATRIC: Normal mood and affect. Normal behavior. Normal judgment and thought content. CARDIOVASCULAR: Normal heart rate noted RESPIRATORY: Effort  normal, no problems with respiration noted ABDOMEN: Soft, no distention noted.   PELVIC: normal appearing external female genitalia with swollen right labia majora approx 3 x 2 cm with raw area, small amount bleeding, no fluctuant areas noted and very little drainage able to be expressed MUSCULOSKELETAL: Normal range of motion. No edema noted.  Labs and Imaging No results found.  Assessment & Plan:  1. Labial  abscess Swab sent No area of induration to I&D Bactrim sent to pharmacy Reviewed care To f/u 1 week to ensure healing  2. AGUS pap - reviewed importance of scheduling colposcopy - patient verbalizes understanding and will make appt  Routine preventative health maintenance measures emphasized. Please refer to After Visit Summary for other counseling recommendations.   Return in about 1 week (around 05/27/2017).   Feliz Beam, M.D. Attending [place], [facility] for [employer], [place]

## 2017-05-20 NOTE — Assessment & Plan Note (Signed)
Patient is presenting with a 3-day history of a very painful "boil in my vagina."  States she had a similar problem several years ago.  She has tried hot compresses with Epsom salt and has also tried an over-the-counter boil cream.  States 2 days ago she started noticing some blood and pus from the area.  She denies having any fevers, chills, nausea, or vomiting.  Reports having regular menstrual cycles; last cycle was the end of January.  Denies noticing any vaginal discharge.  States she has not been sexually active for several years. On exam, noted to have a very large painful, indurated area covering the majority of the labia majora on the right. Scant amount of blood and pus noted to be draining.   It seems to be an abscess of the labia majora even though it was difficult to appreciate fluctuance.  The location did not seem to be consistent with a Bartholin abscess. Chart review also reveals that she had a Pap smear done in November 2018 which revealed atypical glandular cells.  She was supposed to follow-up at the [facility] clinic on November 26 for colposcopy but had canceled that appointment.  Patient stated she had another appointment scheduled for tomorrow February 19 but canceled it as she has to go to the court.   Plan -Explained to the patient that she will likely need I&D of her abscess.  No indication for antibiotic as she does not have any systemic signs of infection.  Our clinic spoke to the woman's clinic and she was scheduled to see them at 11:15 AM this morning for further management.

## 2017-05-20 NOTE — Progress Notes (Signed)
Pt stated have boiled outside of her vagina have yellow discharge, bleeding for about 1 week.

## 2017-05-20 NOTE — Patient Instructions (Addendum)
Tina Lara it was nice meeting you today.  Please go to the [facility] today, you have an appointment at 11:15 AM.

## 2017-05-20 NOTE — Assessment & Plan Note (Addendum)
Blood elevated at 178/75 at this visit.  Patient is asymptomatic - no chest pain, shortness of breath, or headaches.  She did not take her blood pressure medications this morning. Advised her to take her blood pressure medications as soon as possible.  Blood pressure could also partly be increased in the setting of pain.  Plan -Continue current medications (lisinopril and hydrochlorothiazide) -Recheck blood pressure at next visit.  This visit was focused on getting the patient to the woman's clinic as soon as possible.

## 2017-05-21 ENCOUNTER — Ambulatory Visit: Payer: Self-pay | Admitting: Obstetrics & Gynecology

## 2017-05-21 NOTE — Progress Notes (Signed)
Internal Medicine Clinic Attending  Case discussed with Dr. Rathoreat the time of the visit. We reviewed the resident's history and exam and pertinent patient test results. I agree with the assessment, diagnosis, and plan of care documented in the resident's note.  

## 2017-05-24 LAB — AEROBIC CULTURE

## 2017-05-24 LAB — ANAEROBIC CULTURE

## 2017-05-31 ENCOUNTER — Ambulatory Visit: Payer: Self-pay | Admitting: Obstetrics & Gynecology

## 2017-06-04 ENCOUNTER — Other Ambulatory Visit: Payer: Self-pay | Admitting: Internal Medicine

## 2017-06-04 ENCOUNTER — Encounter: Payer: Self-pay | Admitting: Internal Medicine

## 2017-06-04 ENCOUNTER — Ambulatory Visit: Payer: Medicare HMO

## 2017-06-04 DIAGNOSIS — Z0000 Encounter for general adult medical examination without abnormal findings: Secondary | ICD-10-CM

## 2017-06-18 ENCOUNTER — Ambulatory Visit: Payer: Self-pay | Admitting: Obstetrics & Gynecology

## 2017-07-10 ENCOUNTER — Ambulatory Visit (INDEPENDENT_AMBULATORY_CARE_PROVIDER_SITE_OTHER): Payer: Medicare HMO | Admitting: Obstetrics & Gynecology

## 2017-07-10 ENCOUNTER — Other Ambulatory Visit (HOSPITAL_COMMUNITY)
Admission: RE | Admit: 2017-07-10 | Discharge: 2017-07-10 | Disposition: A | Discharge status: Home / Self Care | Payer: Medicare HMO | Source: Ambulatory Visit | Attending: Obstetrics & Gynecology | Admitting: Obstetrics & Gynecology

## 2017-07-10 ENCOUNTER — Encounter: Payer: Self-pay | Admitting: Obstetrics & Gynecology

## 2017-07-10 VITALS — BP 191/96 | HR 88 | Wt 349.2 lb

## 2017-07-10 DIAGNOSIS — N926 Irregular menstruation, unspecified: Secondary | ICD-10-CM

## 2017-07-10 DIAGNOSIS — N879 Dysplasia of cervix uteri, unspecified: Secondary | ICD-10-CM | POA: Diagnosis not present

## 2017-07-10 DIAGNOSIS — N840 Polyp of corpus uteri: Secondary | ICD-10-CM | POA: Insufficient documentation

## 2017-07-10 DIAGNOSIS — R87619 Unspecified abnormal cytological findings in specimens from cervix uteri: Secondary | ICD-10-CM

## 2017-07-10 LAB — POCT PREGNANCY, URINE: Preg Test, Ur: NEGATIVE

## 2017-07-10 NOTE — Progress Notes (Signed)
   Subjective:    Patient ID: Tina Lara, female    DOB: Aug 17, 1955, 62 y.o.   MRN: [id number]  HPI 62 yo lady here for a colpo due to the following pap smear: Component 47mo ago  Adequacy Satisfactory for evaluation endocervical/transformation zone component PRESENT.Abnormal  VC  Diagnosis ATYPICAL GLANDULAR CELLS.Abnormal  VC  HPV NOT DETECTED VC   She did have a EMBX  11/18.  Diagnosis 1. 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 Cervix, polyp - ATYPICAL HYPERPLASIA ARISING IN A ENDOMETRIAL POLYP - SEE COMMENT Review of Systems     Objective:   Physical Exam Breathing, conversing, and ambulating normally Well nourished, well hydrated Black female, no apparent distress, morbidly obese UPT negative, consent signed, time out done Cervix prepped with acetic acid. Transformation zone not seen. Colpo inadequate  ECC obtained. She tolerated the procedure well.       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 Everitt Amber an email asking for her recommendations (I suspect that she will want to do a RATH/BSO/possible nodes)

## 2017-07-10 NOTE — Addendum Note (Signed)
Addended by: Emily Filbert on: 07/10/2017 11:23 AM   Modules accepted: Orders, Level of Service

## 2017-07-11 LAB — FOLLICLE STIMULATING HORMONE: FSH: 7.6 m[IU]/mL

## 2017-07-16 ENCOUNTER — Telehealth: Payer: Self-pay | Admitting: *Deleted

## 2017-07-16 NOTE — Telephone Encounter (Signed)
[facility] for direct hospital procedure for screening colon due to BMI> 50

## 2017-07-16 NOTE — Telephone Encounter (Signed)
Dr. Hilarie Fredrickson,  Patient is coming in for PV on 4/30 and was scheduled for a direct colonoscopy on 5/14.  Upon prepping her chart, her BMI is 50.65 and Wt. Is 353 lbs, which both are over our wt. Limit protocol.  Please advise what you would like to do.   Thank you.  B.Chandy Tarman, CMA  PV

## 2017-07-18 ENCOUNTER — Other Ambulatory Visit: Payer: Self-pay

## 2017-07-18 DIAGNOSIS — Z1211 Encounter for screening for malignant neoplasm of colon: Secondary | ICD-10-CM

## 2017-07-18 NOTE — Telephone Encounter (Signed)
Spoke with pt and notified of colon at [facility] on 09/30/17 at 9:45am and PV was rescheduled for 09/13/17 at 9 am. The pre-visit on 04/30 was cx'd. Pt aware of appts and will call back if she has further questions. Gwyndolyn Saxon

## 2017-07-18 NOTE — Telephone Encounter (Signed)
Pt scheduled for colon with propofol at [facility] 09/30/17@9 :45am. Pt will need to arrive there at 8:15am. Please inform pt of appt change at her previsit.

## 2017-07-22 ENCOUNTER — Telehealth: Payer: Self-pay | Admitting: General Practice

## 2017-07-22 ENCOUNTER — Ambulatory Visit: Payer: Medicare HMO | Admitting: Obstetrics & Gynecology

## 2017-07-22 IMAGING — US US PELVIS COMPLETE
1 series · 13 of 21 positions shown · non-contrast
Comparison: Abdominal and pelvic CT scan December 04, 2011.

CLINICAL DATA: Dysfunctional uterine bleeding for the past 13 days.
The patient refused transvaginal examination. Onset of last normal
menstrual period was September 27, 2016.

EXAM:
TRANSABDOMINAL ULTRASOUND OF PELVIS
TECHNIQUE: Transabdominal ultrasound examination of the pelvis was performed
including evaluation of the uterus, ovaries, adnexal regions, and
pelvic cul-de-sac.

[Series 1: us pelvis complete · 0.28mm/px · 13 of 21 slices shown]
[im 1/21]
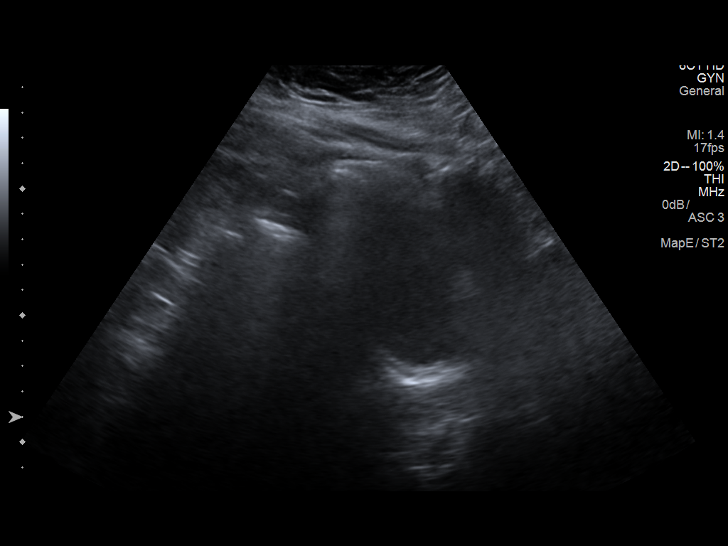
[im 3/21]
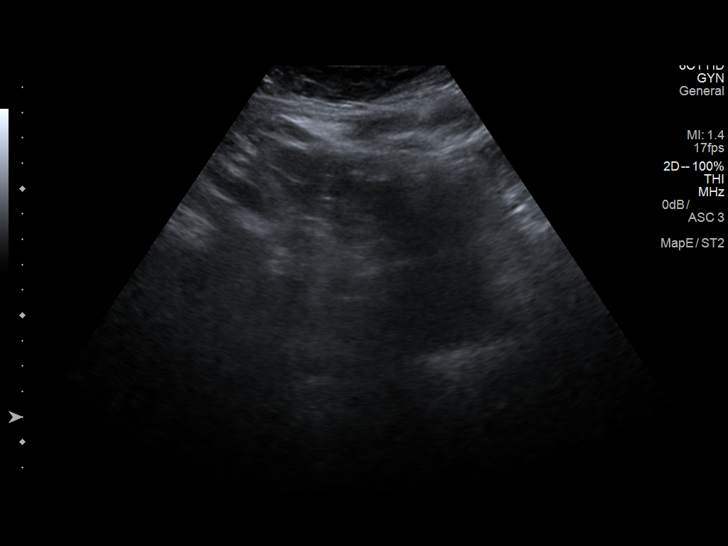
[im 5/21]
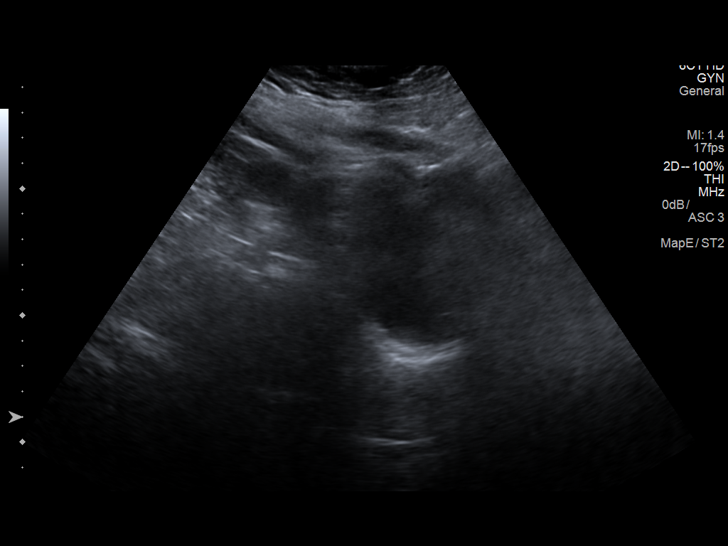
[im 6/21]
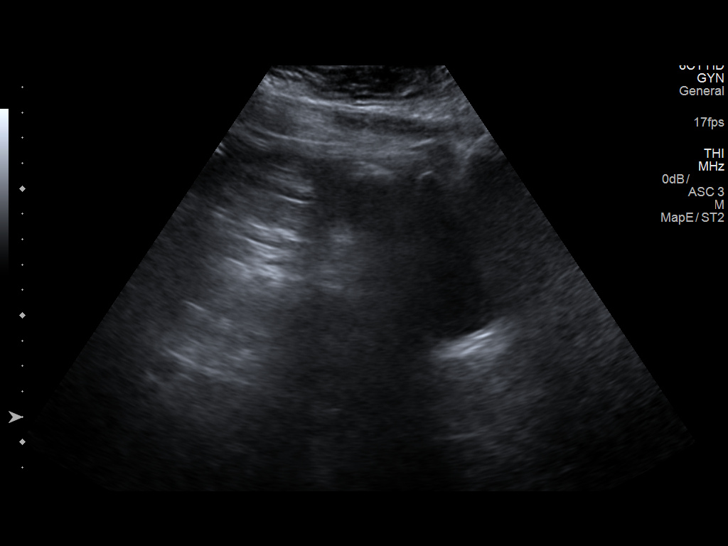
[im 8/21]
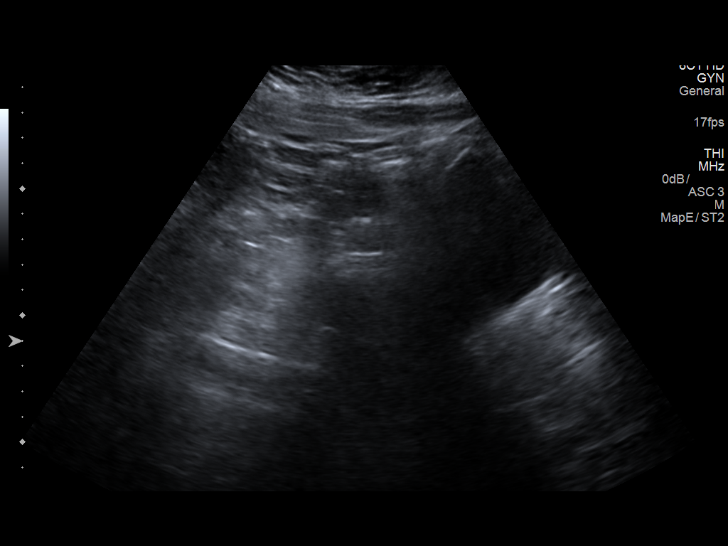
[im 9/21]
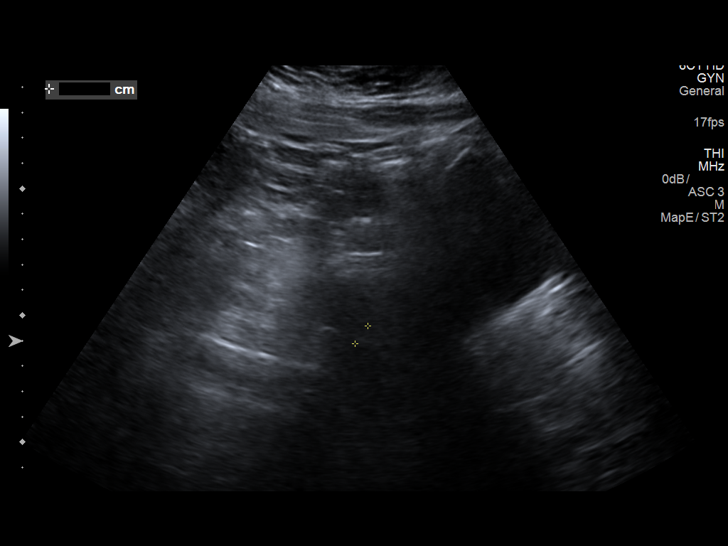
[im 11/21]
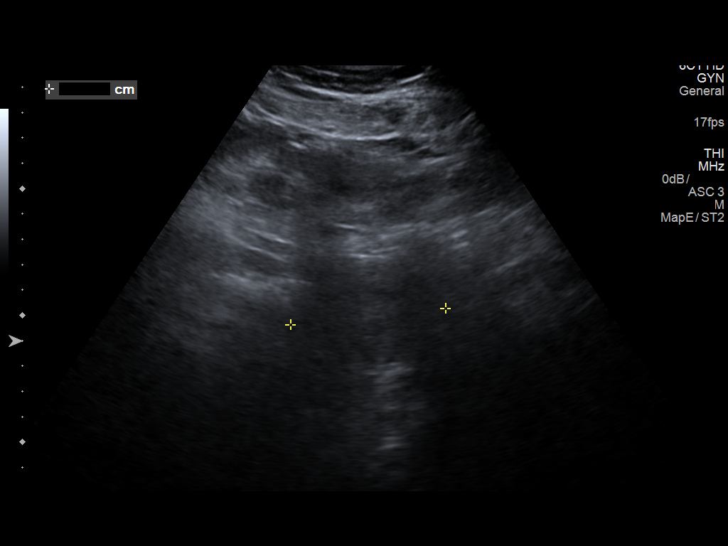
[im 13/21]
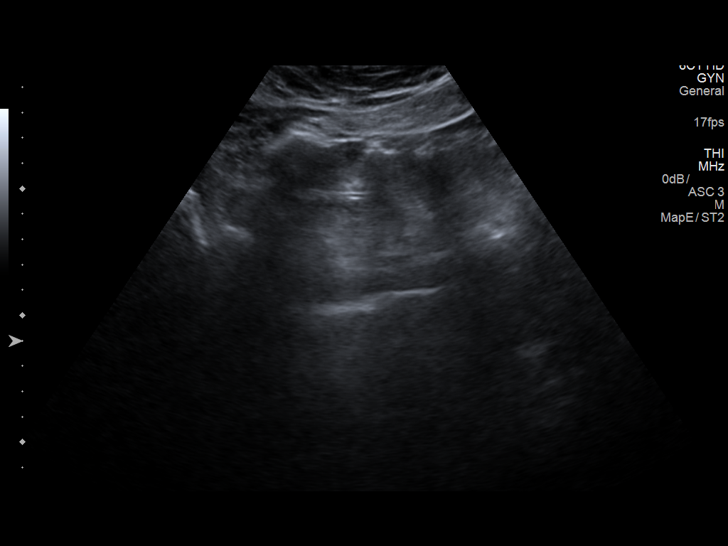
[im 14/21]
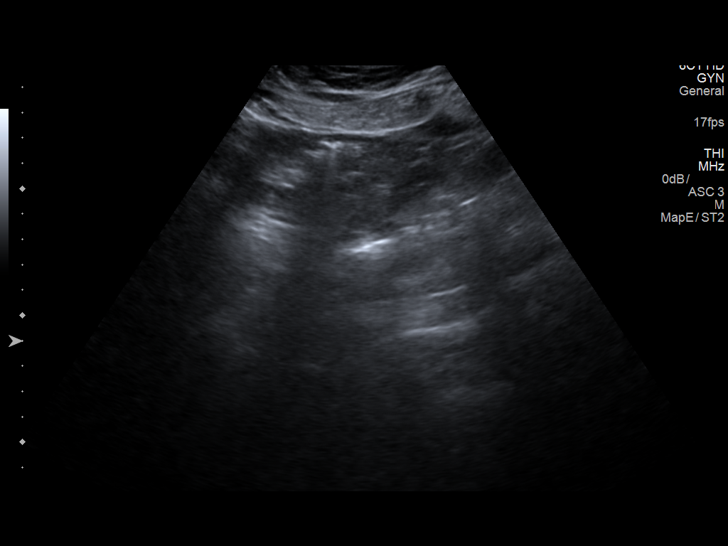
[im 16/21]
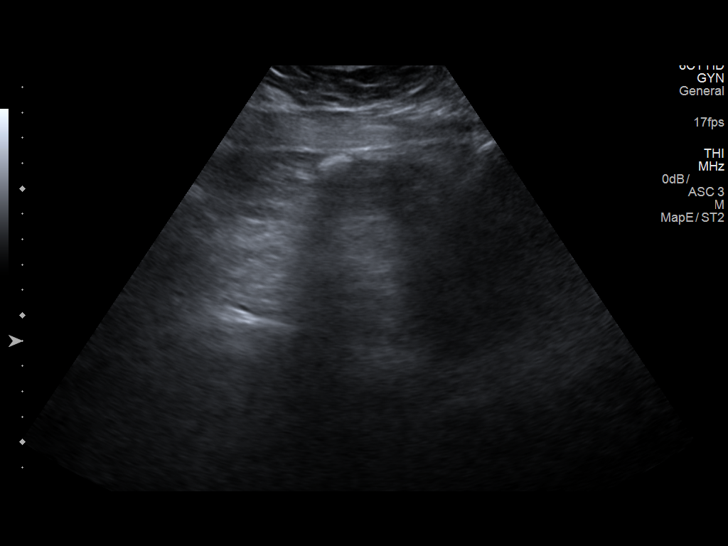
[im 17/21]
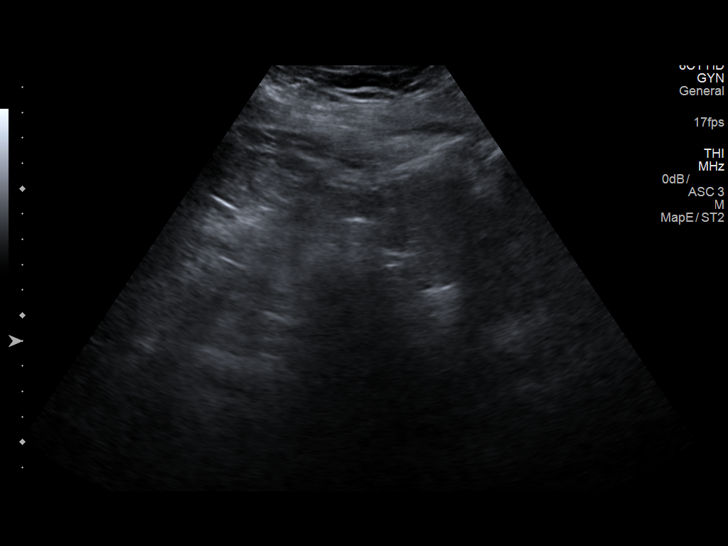
[im 19/21]
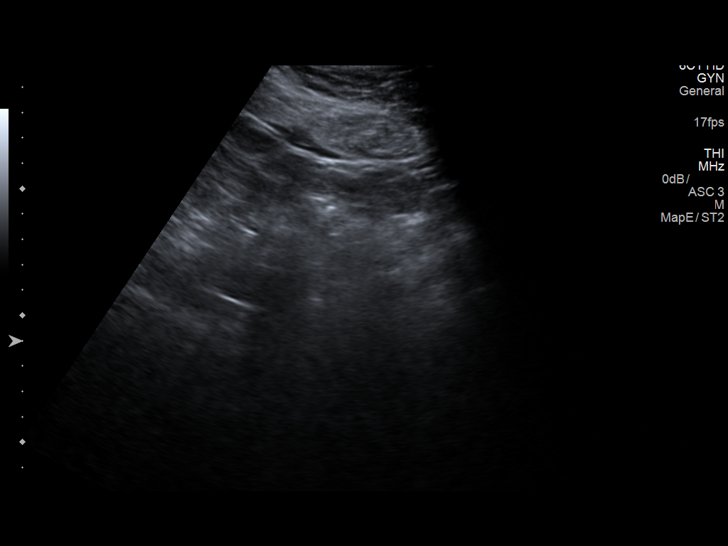
[im 21/21]
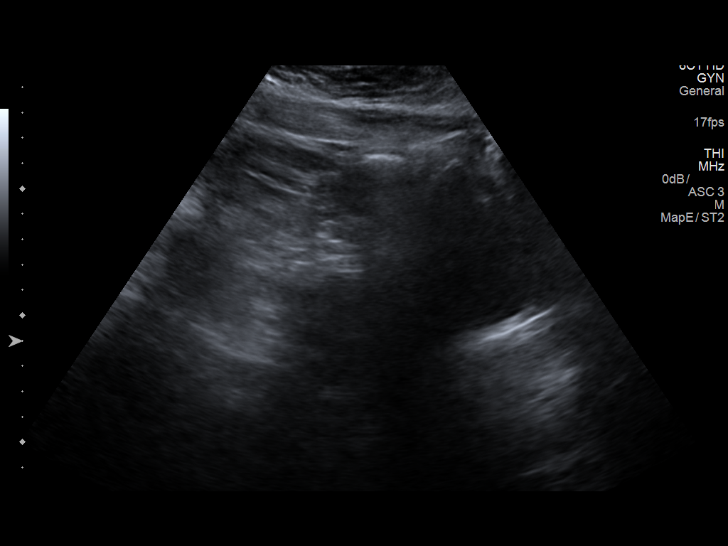

[13 of 21 positions shown; findings below may reference images not displayed]

FINDINGS: Uterus

Measurements: 9.8 x 4.1 by 6.2 cm.. Visualization of the uterine
parenchyma is limited due to the patient's body habitus.

Endometrium

Thickness: 8.6 mm. Visualization of the endometrium is limited due
to the patient's body habitus.

Right ovary

The right ovary could not be visualized.

Left ovary

The left ovary could not be visualized.

Other findings:  No abnormal free fluid.
IMPRESSION: Very limited study with nonvisualization of the ovaries and limited
visualization of the uterus and endometrium due to the patient's
body habitus. If bleeding remains unresponsive to hormonal or
medical therapy, sonohysterogram should be considered for focal
lesion work-up. (Ref: Radiological Reasoning: Algorithmic Workup of
Abnormal Vaginal Bleeding with Endovaginal Sonography and
Sonohysterography. AJR [id number]; [id number])

## 2017-07-22 NOTE — Telephone Encounter (Signed)
Left message on VM in regards to appointment on 08/01/17 at 10:35am with Dr. Hulan Fray.  Asked patient to give our office a call back.

## 2017-08-01 ENCOUNTER — Encounter: Payer: Self-pay | Admitting: Obstetrics & Gynecology

## 2017-08-01 ENCOUNTER — Encounter (HOSPITAL_COMMUNITY): Payer: Self-pay

## 2017-08-01 ENCOUNTER — Ambulatory Visit (INDEPENDENT_AMBULATORY_CARE_PROVIDER_SITE_OTHER): Payer: Medicare HMO | Admitting: Obstetrics & Gynecology

## 2017-08-01 VITALS — BP 155/83 | HR 83 | Wt 347.0 lb

## 2017-08-01 DIAGNOSIS — R87619 Unspecified abnormal cytological findings in specimens from cervix uteri: Secondary | ICD-10-CM

## 2017-08-01 NOTE — Progress Notes (Signed)
   Subjective:    Patient ID: Imagene Riches, female    DOB: [birth year], 62 y.o.   MRN: [id number]  HPI 62 yo lady here to discuss her pathology. 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 She then had a EMBX that showed The following: 1. 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 and 2. There are small foci of atypical hyperplasia, most of which are present within an endometrial polyp. Dr. Saralyn Pilar reviewed the case and agrees with the above diagnosis.    I did a colposcopy that was Inadequate. I did an ECC at that time and it was benign. Review of Systems     Objective:   Physical Exam  Breathing, conversing, and ambulating normally Morbidly obese Black female, no apparent distress Abd- benign     Assessment & Plan:  Inadequate colpo- rec CKC Abnormal cells in EMBX- rec d&c at time of CKC She will then have treatment based on that pathology. I tried to work her surgery in on 08-14-17 but she declines that timing. She has a trip to [place] and doesn't want surgery until she has returned from that trip in June. I sent Drema Balzarine a message to schedule that.I have told her that I am worried about delaying diagnosis.

## 2017-08-12 ENCOUNTER — Encounter: Payer: Self-pay | Admitting: Internal Medicine

## 2017-08-13 ENCOUNTER — Encounter: Payer: Self-pay | Admitting: Internal Medicine

## 2017-09-03 NOTE — Patient Instructions (Addendum)
Your procedure is scheduled on:  Tuesday, June 11  Enter through the Main Entrance of [facility] at:  Per Dr Hulan Fray arrive at 10 am  Pick up the phone at the desk and dial [phone].  Call this number if you have problems the morning of surgery: [phone].  Remember: Do NOT eat food or Do NOT drink clear liquids (including water) after midnight Monday.  Take these medicines the morning of surgery with a SIP OF WATER: lipitor, protonix and lisinopril per Dr Valma Cava, Anesthesia.  Do not take your metformin Monday night dose or Tuesday morning dose (day of surgery).  Do not take any diabetes medication on day of surgery.  We will check your blood sugar upon arrival and treat if needed.  Stop herbal medications, vitamin supplements, ibuprofen/NSAIDS now.    Do NOT wear jewelry (body piercing), metal hair clips/bobby pins, make-up, or nail polish. Do NOT wear lotions, powders, or perfumes.  You may wear deoderant. Do NOT shave for 48 hours prior to surgery. Do NOT bring valuables to the hospital. Dentures may not be worn into surgery.  Have a responsible adult drive you home and stay with you for 24 hours after your procedure.  Home with Daughter Bary Castilla cell [phone].

## 2017-09-09 ENCOUNTER — Encounter (HOSPITAL_COMMUNITY)
Admission: RE | Admit: 2017-09-09 | Discharge: 2017-09-09 | Disposition: A | Discharge status: Home / Self Care | Payer: Medicare HMO | Source: Ambulatory Visit | Attending: Obstetrics & Gynecology | Admitting: Obstetrics & Gynecology

## 2017-09-09 ENCOUNTER — Encounter (HOSPITAL_COMMUNITY): Payer: Self-pay

## 2017-09-09 ENCOUNTER — Other Ambulatory Visit: Payer: Self-pay

## 2017-09-09 DIAGNOSIS — E119 Type 2 diabetes mellitus without complications: Secondary | ICD-10-CM | POA: Diagnosis not present

## 2017-09-09 DIAGNOSIS — Z87891 Personal history of nicotine dependence: Secondary | ICD-10-CM | POA: Diagnosis not present

## 2017-09-09 DIAGNOSIS — Z7982 Long term (current) use of aspirin: Secondary | ICD-10-CM | POA: Diagnosis not present

## 2017-09-09 DIAGNOSIS — Z79899 Other long term (current) drug therapy: Secondary | ICD-10-CM | POA: Diagnosis not present

## 2017-09-09 DIAGNOSIS — N840 Polyp of corpus uteri: Secondary | ICD-10-CM | POA: Diagnosis present

## 2017-09-09 DIAGNOSIS — I10 Essential (primary) hypertension: Secondary | ICD-10-CM | POA: Diagnosis not present

## 2017-09-09 DIAGNOSIS — Z7984 Long term (current) use of oral hypoglycemic drugs: Secondary | ICD-10-CM | POA: Diagnosis not present

## 2017-09-09 DIAGNOSIS — Z6841 Body Mass Index (BMI) 40.0 and over, adult: Secondary | ICD-10-CM | POA: Diagnosis not present

## 2017-09-09 DIAGNOSIS — N8501 Benign endometrial hyperplasia: Secondary | ICD-10-CM | POA: Diagnosis not present

## 2017-09-09 HISTORY — DX: Type 2 diabetes mellitus without complications: E11.9

## 2017-09-09 HISTORY — DX: Hyperlipidemia, unspecified: E78.5

## 2017-09-09 HISTORY — DX: Gastro-esophageal reflux disease without esophagitis: K21.9

## 2017-09-09 HISTORY — DX: Other specified postprocedural states: Z98.890

## 2017-09-09 HISTORY — DX: Complete loss of teeth, unspecified cause, unspecified class: K08.109

## 2017-09-09 HISTORY — DX: Presence of dental prosthetic device (complete) (partial): Z97.2

## 2017-09-09 LAB — BASIC METABOLIC PANEL
Anion gap: 9 (ref 5–15)
BUN: 13 mg/dL (ref 6–20)
CO2: 25 mmol/L (ref 22–32)
Calcium: 8.7 mg/dL — ABNORMAL LOW (ref 8.9–10.3)
Chloride: 102 mmol/L (ref 101–111)
Creatinine, Ser: 0.9 mg/dL (ref 0.44–1.00)
GFR calc Af Amer: >60 mL/min (ref >60)
GFR calc non Af Amer: >60 mL/min (ref >60)
Glucose, Bld: 152 mg/dL — ABNORMAL HIGH (ref 65–99)
Potassium: 3.8 mmol/L (ref 3.5–5.1)
Sodium: 136 mmol/L (ref 135–145)

## 2017-09-09 LAB — CBC
HCT: 36.1 % (ref 36.0–46.0)
Hemoglobin: 11.4 g/dL — ABNORMAL LOW (ref 12.0–15.0)
MCH: 26.5 pg (ref 26.0–34.0)
MCHC: 31.6 g/dL (ref 30.0–36.0)
MCV: 84 fL (ref 78.0–100.0)
Platelets: 188 10*3/uL (ref 150–400)
RBC: 4.3 MIL/uL (ref 3.87–5.11)
RDW: 12.7 % (ref 11.5–15.5)
WBC: 5 10*3/uL (ref 4.0–10.5)

## 2017-09-09 NOTE — Pre-Procedure Instructions (Signed)
Dr Valma Cava called.  Patient's BP is elevated at PAT appt today.  175/93 and 173/93.  Patient had not taken her lisinopril today prior to PAT appt.  Per Dr. Valma Cava, patient instructed to take lisinopril on DOS with protonix and lipitor.  Patient verbalized understanding.

## 2017-09-10 ENCOUNTER — Ambulatory Visit (HOSPITAL_COMMUNITY): Payer: Medicare HMO | Admitting: Certified Registered Nurse Anesthetist

## 2017-09-10 ENCOUNTER — Ambulatory Visit (HOSPITAL_COMMUNITY)
Admission: RE | Admit: 2017-09-10 | Discharge: 2017-09-10 | Disposition: A | Discharge status: Home / Self Care | Payer: Medicare HMO | Source: Ambulatory Visit | Attending: Obstetrics & Gynecology | Admitting: Obstetrics & Gynecology

## 2017-09-10 ENCOUNTER — Encounter (HOSPITAL_COMMUNITY)
Admission: RE | Disposition: A | Discharge status: Home / Self Care | Payer: Self-pay | Source: Ambulatory Visit | Attending: Obstetrics & Gynecology

## 2017-09-10 ENCOUNTER — Other Ambulatory Visit: Payer: Self-pay

## 2017-09-10 ENCOUNTER — Encounter (HOSPITAL_COMMUNITY): Payer: Self-pay | Admitting: *Deleted

## 2017-09-10 DIAGNOSIS — R87610 Atypical squamous cells of undetermined significance on cytologic smear of cervix (ASC-US): Secondary | ICD-10-CM | POA: Diagnosis not present

## 2017-09-10 DIAGNOSIS — N8500 Endometrial hyperplasia, unspecified: Secondary | ICD-10-CM | POA: Diagnosis not present

## 2017-09-10 DIAGNOSIS — Z6841 Body Mass Index (BMI) 40.0 and over, adult: Secondary | ICD-10-CM | POA: Diagnosis not present

## 2017-09-10 DIAGNOSIS — Z79899 Other long term (current) drug therapy: Secondary | ICD-10-CM | POA: Insufficient documentation

## 2017-09-10 DIAGNOSIS — I10 Essential (primary) hypertension: Secondary | ICD-10-CM | POA: Insufficient documentation

## 2017-09-10 DIAGNOSIS — Z7984 Long term (current) use of oral hypoglycemic drugs: Secondary | ICD-10-CM | POA: Diagnosis not present

## 2017-09-10 DIAGNOSIS — N840 Polyp of corpus uteri: Secondary | ICD-10-CM

## 2017-09-10 DIAGNOSIS — Z7982 Long term (current) use of aspirin: Secondary | ICD-10-CM | POA: Diagnosis not present

## 2017-09-10 DIAGNOSIS — Z87891 Personal history of nicotine dependence: Secondary | ICD-10-CM | POA: Insufficient documentation

## 2017-09-10 DIAGNOSIS — E119 Type 2 diabetes mellitus without complications: Secondary | ICD-10-CM | POA: Insufficient documentation

## 2017-09-10 DIAGNOSIS — N8501 Benign endometrial hyperplasia: Secondary | ICD-10-CM | POA: Insufficient documentation

## 2017-09-10 HISTORY — PX: CERVICAL CONIZATION W/BX: SHX1330

## 2017-09-10 HISTORY — PX: DILATION AND CURETTAGE OF UTERUS: SHX78

## 2017-09-10 SURGERY — DILATION AND CURETTAGE
Anesthesia: General

## 2017-09-10 MED ORDER — LIDOCAINE HCL (CARDIAC) PF 100 MG/5ML IV SOSY
PREFILLED_SYRINGE | INTRAVENOUS | Status: DC | PRN
  Administered 2017-09-10: 100 mg via INTRAVENOUS

## 2017-09-10 MED ORDER — PROPOFOL 10 MG/ML IV BOLUS
INTRAVENOUS | Status: DC | PRN
  Administered 2017-09-10: 300 mg via INTRAVENOUS
  Administered 2017-09-10: 100 mg via INTRAVENOUS

## 2017-09-10 MED ORDER — EPHEDRINE 5 MG/ML INJ
INTRAVENOUS | Status: AC
  Filled 2017-09-10: qty 10

## 2017-09-10 MED ORDER — OXYCODONE HCL 5 MG/5ML PO SOLN: 5.0000 mg | Freq: Once | ORAL | Status: DC | PRN

## 2017-09-10 MED ORDER — PROPOFOL 10 MG/ML IV BOLUS
INTRAVENOUS | Status: AC
  Filled 2017-09-10: qty 20

## 2017-09-10 MED ORDER — PHENYLEPHRINE HCL 10 MG/ML IJ SOLN
INTRAMUSCULAR | Status: DC | PRN
  Administered 2017-09-10 (×4): 80 ug via INTRAVENOUS

## 2017-09-10 MED ORDER — BUPIVACAINE HCL 0.5 % IJ SOLN
INTRAMUSCULAR | Status: DC | PRN
  Administered 2017-09-10: 20 mL

## 2017-09-10 MED ORDER — LIDOCAINE HCL (CARDIAC) PF 100 MG/5ML IV SOSY
PREFILLED_SYRINGE | INTRAVENOUS | Status: AC
  Filled 2017-09-10: qty 5

## 2017-09-10 MED ORDER — KETOROLAC TROMETHAMINE 30 MG/ML IJ SOLN
INTRAMUSCULAR | Status: AC
Start: 2017-09-10 — End: ?
  Filled 2017-09-10: qty 1

## 2017-09-10 MED ORDER — SCOPOLAMINE 1 MG/3DAYS TD PT72
1.0000 | MEDICATED_PATCH | Freq: Once | TRANSDERMAL | Status: DC
  Administered 2017-09-10: 1.5 mg via TRANSDERMAL

## 2017-09-10 MED ORDER — IODINE STRONG (LUGOLS) 5 % PO SOLN
ORAL | Status: DC | PRN
  Administered 2017-09-10: 0.2 mL

## 2017-09-10 MED ORDER — EPHEDRINE SULFATE 50 MG/ML IJ SOLN
INTRAMUSCULAR | Status: DC | PRN
  Administered 2017-09-10: 10 mg via INTRAVENOUS
  Administered 2017-09-10: 5 mg via INTRAVENOUS

## 2017-09-10 MED ORDER — MEPERIDINE HCL 25 MG/ML IJ SOLN: 6.2500 mg | INTRAMUSCULAR | Status: DC | PRN

## 2017-09-10 MED ORDER — PROMETHAZINE HCL 25 MG/ML IJ SOLN: 6.2500 mg | INTRAMUSCULAR | Status: DC | PRN

## 2017-09-10 MED ORDER — FERRIC SUBSULFATE 259 MG/GM EX SOLN
CUTANEOUS | Status: AC
  Filled 2017-09-10: qty 8

## 2017-09-10 MED ORDER — KETOROLAC TROMETHAMINE 30 MG/ML IJ SOLN: 30.0000 mg | Freq: Once | INTRAMUSCULAR | Status: DC | PRN

## 2017-09-10 MED ORDER — ONDANSETRON HCL 4 MG/2ML IJ SOLN
INTRAMUSCULAR | Status: DC | PRN
  Administered 2017-09-10: 4 mg via INTRAVENOUS

## 2017-09-10 MED ORDER — ACETIC ACID 5 % SOLN
Status: AC
  Filled 2017-09-10: qty 500

## 2017-09-10 MED ORDER — ONDANSETRON HCL 4 MG/2ML IJ SOLN
INTRAMUSCULAR | Status: AC
  Filled 2017-09-10: qty 2

## 2017-09-10 MED ORDER — FENTANYL CITRATE (PF) 100 MCG/2ML IJ SOLN
INTRAMUSCULAR | Status: DC | PRN
  Administered 2017-09-10 (×2): 50 ug via INTRAVENOUS

## 2017-09-10 MED ORDER — SCOPOLAMINE 1 MG/3DAYS TD PT72
MEDICATED_PATCH | TRANSDERMAL | Status: AC
  Filled 2017-09-10: qty 1

## 2017-09-10 MED ORDER — PHENYLEPHRINE 40 MCG/ML (10ML) SYRINGE FOR IV PUSH (FOR BLOOD PRESSURE SUPPORT)
PREFILLED_SYRINGE | INTRAVENOUS | Status: AC
Start: 2017-09-10 — End: ?
  Filled 2017-09-10: qty 10

## 2017-09-10 MED ORDER — GLYCOPYRROLATE 0.2 MG/ML IJ SOLN
INTRAMUSCULAR | Status: AC
  Filled 2017-09-10: qty 1

## 2017-09-10 MED ORDER — BUPIVACAINE HCL (PF) 0.5 % IJ SOLN
INTRAMUSCULAR | Status: AC
  Filled 2017-09-10: qty 30

## 2017-09-10 MED ORDER — OXYCODONE-ACETAMINOPHEN 5-325 MG PO TABS: 1.0000 | ORAL_TABLET | Freq: Four times a day (QID) | ORAL | 0 refills | Status: DC | PRN

## 2017-09-10 MED ORDER — FENTANYL CITRATE (PF) 250 MCG/5ML IJ SOLN
INTRAMUSCULAR | Status: AC
  Filled 2017-09-10: qty 5

## 2017-09-10 MED ORDER — OXYCODONE HCL 5 MG PO TABS: 5.0000 mg | ORAL_TABLET | Freq: Once | ORAL | Status: DC | PRN

## 2017-09-10 MED ORDER — MIDAZOLAM HCL 2 MG/2ML IJ SOLN
INTRAMUSCULAR | Status: DC | PRN
  Administered 2017-09-10: 1 mg via INTRAVENOUS

## 2017-09-10 MED ORDER — LACTATED RINGERS IV SOLN
INTRAVENOUS | Status: DC
  Administered 2017-09-10 (×2): via INTRAVENOUS

## 2017-09-10 MED ORDER — HYDROMORPHONE HCL 1 MG/ML IJ SOLN: 0.2500 mg | INTRAMUSCULAR | Status: DC | PRN

## 2017-09-10 MED ORDER — IODINE STRONG (LUGOLS) 5 % PO SOLN
ORAL | Status: AC
  Filled 2017-09-10: qty 1

## 2017-09-10 MED ORDER — MIDAZOLAM HCL 2 MG/2ML IJ SOLN
INTRAMUSCULAR | Status: AC
  Filled 2017-09-10: qty 2

## 2017-09-10 SURGICAL SUPPLY — 30 items
BLADE SURG 11 STRL SS (BLADE) ×2 IMPLANT
CLEANER TIP ELECTROSURG 2X2 (MISCELLANEOUS) ×2 IMPLANT
CNTNR SPEC C3OZ STD GRAD LEK (MISCELLANEOUS) ×1 IMPLANT
CONT SPEC 3OZ W/LID STRL (MISCELLANEOUS) ×1
DILATOR CANAL MILEX (MISCELLANEOUS) IMPLANT
ELECT REM PT RETURN 9FT ADLT (ELECTROSURGICAL) ×2
ELECTRODE REM PT RTRN 9FT ADLT (ELECTROSURGICAL) ×1 IMPLANT
GLOVE BIO SURGEON STRL SZ 6.5 (GLOVE) ×2 IMPLANT
GLOVE BIOGEL PI IND STRL 6 (GLOVE) ×1 IMPLANT
GLOVE BIOGEL PI IND STRL 7.0 (GLOVE) ×1 IMPLANT
GLOVE BIOGEL PI INDICATOR 6 (GLOVE) ×1
GLOVE BIOGEL PI INDICATOR 7.0 (GLOVE) ×1
GOWN STRL REUS W/TWL LRG LVL3 (GOWN DISPOSABLE) ×4 IMPLANT
HOVERMATT SINGLE USE (MISCELLANEOUS) ×2 IMPLANT
NEEDLE SPNL 18GX3.5 QUINCKE PK (NEEDLE) ×2 IMPLANT
PACK TRENDGUARD 600 HYBRD PROC (MISCELLANEOUS) ×1 IMPLANT
PACK VAGINAL MINOR WOMEN LF (CUSTOM PROCEDURE TRAY) ×2 IMPLANT
PAD OB MATERNITY 4.3X12.25 (PERSONAL CARE ITEMS) ×2 IMPLANT
PAD PREP 24X48 CUFFED NSTRL (MISCELLANEOUS) ×2 IMPLANT
PENCIL BUTTON HOLSTER BLD 10FT (ELECTRODE) ×2 IMPLANT
SCOPETTES 8  STERILE (MISCELLANEOUS) ×2
SCOPETTES 8 STERILE (MISCELLANEOUS) ×2 IMPLANT
SPONGE SURGIFOAM ABS GEL 12-7 (HEMOSTASIS) ×2 IMPLANT
SUT VIC AB 0 CT1 27 (SUTURE) ×2
SUT VIC AB 0 CT1 27XBRD ANBCTR (SUTURE) ×2 IMPLANT
SUT VIC AB 0 CT2 27 (SUTURE) ×4 IMPLANT
TOWEL OR 17X24 6PK STRL BLUE (TOWEL DISPOSABLE) ×4 IMPLANT
TRENDGUARD 600 HYBRID PROC PK (MISCELLANEOUS) ×2
TUBING NON-CON 1/4 X 20 CONN (TUBING) ×2 IMPLANT
YANKAUER SUCT BULB TIP NO VENT (SUCTIONS) ×2 IMPLANT

## 2017-09-10 NOTE — Anesthesia Procedure Notes (Signed)
Procedure Name: LMA Insertion Date/Time: 09/10/2017 11:46 AM Performed by: Nolon Nations, MD Pre-anesthesia Checklist: Patient identified, Patient being monitored, Emergency Drugs available, Timeout performed and Suction available Patient Re-evaluated:Patient Re-evaluated prior to induction Oxygen Delivery Method: Circle System Utilized Preoxygenation: Pre-oxygenation with 100% oxygen Induction Type: IV induction Ventilation: Mask ventilation without difficulty LMA: LMA inserted LMA Size: 5.0 Number of attempts: 1 Placement Confirmation: positive ETCO2 and breath sounds checked- equal and bilateral

## 2017-09-10 NOTE — Progress Notes (Signed)
CBG is 128 in SS.

## 2017-09-10 NOTE — Anesthesia Postprocedure Evaluation (Signed)
Anesthesia Post Note  Patient: Tina Lara  Procedure(s) Performed: DILATATION AND CURETTAGE (N/A ) CONIZATION CERVIX WITH BIOPSY - COLD KNIFE (N/A )     Patient location during evaluation: PACU Anesthesia Type: General Level of consciousness: sedated and patient cooperative Pain management: pain level controlled Vital Signs Assessment: post-procedure vital signs reviewed and stable Respiratory status: spontaneous breathing Cardiovascular status: stable Anesthetic complications: no    Last Vitals:  Vitals:   09/10/17 1330 09/10/17 1415  BP: 128/72 (!) 158/82  Pulse: 84 76  Resp: 20 20  Temp:    SpO2: 90% 97%    Last Pain:  Vitals:   09/10/17 1415  TempSrc:   PainSc: 0-No pain   Pain Goal: Patients Stated Pain Goal: 3 (09/10/17 1245)               Tina Lara

## 2017-09-10 NOTE — H&P (Signed)
Tina Lara is a. 62 yo lady here to have a d&c and CKC.  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 She then had a EMBX that showed The following: 1. 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 and 2. There are small foci of atypical hyperplasia, most of which are present within an endometrial polyp. Dr. Saralyn Pilar reviewed the case and agrees with the above diagnosis.    I did a colposcopy that was Inadequate. I did an ECC at that time and it was benign.   Patient's last menstrual period was 09/03/2017 (exact date).    Past Medical History:  Diagnosis Date  . Anxiety   . Chest pain 08/14/2016  . Complication of anesthesia    Per patient "needs a lot of anesthesia w/surgery" ,I had toruble with a spinal block not taking "  . Diabetes mellitus without complication (HCC)    Type 2  . Full dentures   . GERD (gastroesophageal reflux disease)   . History of surgery on arm    right arm broken  . Hyperlipidemia   . Hypertension    never diagnosed by physician per pt  . Obesity     Past Surgical History:  Procedure Laterality Date  . [place]   x 1  . CHOLECYSTECTOMY     gall stones  . MULTIPLE TOOTH EXTRACTIONS     dentures upper and lowers  . TONSILLECTOMY      History reviewed. No pertinent family history.  Social History:  reports that she has quit smoking. Her smoking use included cigarettes. She has a 1.50 pack-year smoking history. She has never used smokeless tobacco. She reports that she does not drink alcohol or use drugs.  Allergies: No Known Allergies  Medications Prior to Admission  Medication Sig Dispense Refill Last Dose  . acetaminophen (TYLENOL) 500 MG tablet Take 1,500 mg by mouth every 6 (six) hours as needed for moderate pain.    09/09/2017 at Unknown time  . aspirin 81 MG EC tablet  Take 1 tablet (81 mg total) by mouth daily. (Patient taking differently: Take 81 mg by mouth every other day. ) 30 tablet 0 Past Week at Unknown time  . atorvastatin (LIPITOR) 20 MG tablet Take 1 tablet (20 mg total) by mouth daily. (Patient taking differently: Take 20 mg by mouth daily. Takes med at 11 am daily) 30 tablet 11 09/09/2017 at Unknown time  . ibuprofen (ADVIL,MOTRIN) 200 MG tablet Take 600 mg by mouth every 6 (six) hours as needed for headache or moderate pain.    Past Week at Unknown time  . lisinopril (PRINIVIL,ZESTRIL) 20 MG tablet Take 1 tablet (20 mg total) by mouth daily. (Patient taking differently: Take 20 mg by mouth daily. Takes med at 11 am daily) 30 tablet 2 09/10/2017 at 0700  . Menthol, Topical Analgesic, (BIOFREEZE EX) Apply 1 application topically daily as needed (back pain).   Past Week at Unknown time  . metFORMIN (GLUCOPHAGE) 1000 MG tablet Take 1 tablet (1,000 mg total) by mouth 2 (two) times daily with a meal. (Patient taking differently: Take 1,000 mg by mouth 2 (two) times daily with a meal. Takes med at 11 am and 7 pm daily) 60 tablet 3 09/09/2017 at Unknown time  . pantoprazole (PROTONIX) 40 MG tablet Take 1 tablet (40 mg total) by mouth daily. (  Patient taking differently: Take 40 mg by mouth daily. Takes med at 11 am daily) 30 tablet 2 09/09/2017 at Unknown time  . diclofenac sodium (VOLTAREN) 1 % GEL Apply 4 g topically 4 (four) times daily. (Patient not taking: Reported on 09/06/2017) 100 g 2 Not Taking at Unknown time  . hydrochlorothiazide (HYDRODIURIL) 25 MG tablet Take 1 tablet (25 mg total) by mouth daily. (Patient not taking: Reported on 09/06/2017) 30 tablet 2 Unknown at Unknown time    ROS  Blood pressure (!) 182/96, pulse 87, temperature 98.1 F (36.7 C), temperature source Oral, resp. rate 16, last menstrual period 09/03/2017, SpO2 100 %. Physical Exam  Heart- rrr Lungs- CTAB Abd- benign  No results found for this or any previous visit (from the past 24  hour(s)).  No results found.  Assessment/Plan: AGCUS with inadequate colposcopy- plan for CKC Atypical endometrial hyperplasia- plan for d&c  She understands the risks of surgery, including, but not to infection, bleeding, DVTs, damage to bowel, bladder, ureters. She wishes to proceed.     Emily Filbert 09/10/2017, 11:19 AM

## 2017-09-10 NOTE — Op Note (Signed)
09/10/2017  12:23 PM  PATIENT:  Tina Lara  62 y.o. female  PRE-OPERATIVE DIAGNOSIS:  AGCUS, inadequate colposcopy, atypical uterine hyperplasia, morbid obesity  POST-OPERATIVE DIAGNOSIS:  same  PROCEDURE:  Procedure(s): DILATATION AND CURETTAGE (N/A) CONIZATION CERVIX WITH BIOPSY - COLD KNIFE (N/A)  SURGEON:  Surgeon(s) and Role:    * Curstin Schmale C, MD - Primary  ANESTHESIA:   local and general  EBL:  10 mL   BLOOD ADMINISTERED:none  DRAINS: none   LOCAL MEDICATIONS USED:  MARCAINE     SPECIMEN:  Source of Specimen:  uterine curettings, endocervical curettings, and cone biopsy of cervix  DISPOSITION OF SPECIMEN:  PATHOLOGY  COUNTS:  YES  TOURNIQUET:  * No tourniquets in log *  DICTATION: .Dragon Dictation  PLAN OF CARE: Discharge to home after PACU  PATIENT DISPOSITION:  PACU - hemodynamically stable.   Delay start of Pharmacological VTE agent (>24hrs) due to surgical blood loss or risk of bleeding: not applicable    The risks, benefits, and alternatives of surgery were explained, understood, and accepted. All questions were answered. Consents were signed. In the operating room general anesthesia was applied without complication, and she was placed in the dorsal lithotomy position. Her vagina was prepped and draped in the usual sterile fashion. Due to her BMI, a trengard was placed to allow for safe Trendelenburg positioning.  A bimanual exam revealed a 6 week size , anteverted mobile uterus. Her adnexa were non palpable.  A weighted long speculum was placed and a single-tooth tenaculum was used to grasp the anterior lip of her cervix. A total of 20 mL of 0.5% Marcaine was used to perform a paracervical block. Her uterus sounded to 10 cm. Her cervix was carefully and slowly dilated to accommodate a small curette. A curettage was done in all quadrants and the fundus of the uterus. A large amount of polypoid-type  tissue was obtained. A gritty sensation was appreciated  throughout. There was no bleeding noted at the end of the case.  Lugol's solution was generously applied to the cervix. There was a small amount of non-staining area at the cervical os.  The anterior lip of the cervix was grasped with a single-tooth tenaculum. A cone shaped specimen of the cervix was removed and endocervical curettings were obtained. The cone bed was cauterized with the Bovie. Hemostasis was noted. A small piece of gelfoam was placed on the cone bed.  A purse string closure of the cervix was done with a 0 Vicryl suture. Excellent hemostasis was noted. The instrument sponge, and needle counts were correct. She was taken to the recovery room in stable condition. She tolerated the procedure well.

## 2017-09-10 NOTE — Transfer of Care (Signed)
Immediate Anesthesia Transfer of Care Note  Patient: Tina Lara  Procedure(s) Performed: DILATATION AND CURETTAGE (N/A ) CONIZATION CERVIX WITH BIOPSY - COLD KNIFE (N/A )  Patient Location: PACU  Anesthesia Type:General  Level of Consciousness: awake, alert  and oriented  Airway & Oxygen Therapy: Patient Spontanous Breathing and Patient connected to face mask oxygen  Post-op Assessment: Report given to RN, Post -op Vital signs reviewed and stable and Patient moving all extremities X 4  Post vital signs: Reviewed and stable  Last Vitals:  Vitals Value Taken Time  BP 120/54 09/10/2017 12:45 PM  Temp    Pulse 95 09/10/2017 12:47 PM  Resp 26 09/10/2017 12:47 PM  SpO2 88 % 09/10/2017 12:47 PM  Vitals shown include unvalidated device data.  Last Pain:  Vitals:   09/10/17 1004  TempSrc: Oral  PainSc: 2       Patients Stated Pain Goal: 3 (87/19/59 7471)  Complications: No apparent anesthesia complications

## 2017-09-10 NOTE — Discharge Instructions (Signed)
Dilation and Curettage or Vacuum Curettage, Care After This sheet gives you information about how to care for yourself after your procedure. Your health care provider may also give you more specific instructions. If you have problems or questions, contact your health care provider. What can I expect after the procedure? After your procedure, it is common to have:  Mild pain or cramping.  Some vaginal bleeding or spotting. These may last for up to 2 weeks after your procedure. Follow these instructions at home: Activity  Do not drive or use heavy machinery while taking prescription pain medicine.  Avoid driving for the first 24 hours after your procedure.  Take frequent, short walks, followed by rest periods, throughout the day. Ask your health care provider what activities are safe for you. After 1-2 days, you may be able to return to your normal activities.  Do not lift anything heavier than 10 lb (4.5 kg) until your health care provider approves.  For at least 2 weeks, or as long as told by your health care provider, do not: ? Douche. ? Use tampons. ? Have sexual intercourse. General instructions  Take over-the-counter and prescription medicines only as told by your health care provider. This is especially important if you take blood thinning medicine.  Do not take baths, swim, or use a hot tub until your health care provider approves. Take showers instead of baths.  Wear compression stockings as told by your health care provider. These stockings help to prevent blood clots and reduce swelling in your legs.  It is your responsibility to get the results of your procedure. Ask your health care provider, or the department performing the procedure, when your results will be ready.  Keep all follow-up visits as told by your health care provider. This is important. Contact a health care provider if:  You have severe cramps that get worse or that do not get better with  medicine.  You have severe abdominal pain.  You cannot drink fluids without vomiting.  You develop pain in a different area of your pelvis.  You have bad-smelling vaginal discharge.  You have a rash. Get help right away if:  You have vaginal bleeding that soaks more than one sanitary pad in 1 hour, for 2 hours in a row.  You pass large blood clots from your vagina.  You have a fever that is above 100.8F (38.0C).  Your abdomen feels very tender or hard.  You have chest pain.  You have shortness of breath.  You cough up blood.  You feel dizzy or light-headed.  You faint.  You have pain in your neck or shoulder area. This information is not intended to replace advice given to you by your health care provider. Make sure you discuss any questions you have with your health care provider. Cervical Conization, Care After This sheet gives you information about how to care for yourself after your procedure. Your doctor may also give you more specific instructions. If you have problems or questions, contact your doctor. Follow these instructions at home: Medicines  Take over-the-counter and prescription medicines only as told by your doctor.  Do not take aspirin until your doctor says it is okay.  If you take pain medicine: ? You may have constipation. To help treat this, your doctor may tell you to:  Drink enough fluid to keep your pee (urine) clear or pale yellow.  Take medicines.  Eat foods that are high in fiber. These include fresh fruits and vegetables, whole  grains, bran, and beans.  Limit foods that are high in fat and sugar. These include fried foods and sweet foods. ? Do not drive or use heavy machines. General instructions  You can eat your usual diet unless your doctor tells you not to do so.  Take showers for the first week. Do not take baths, swim, or use hot tubs until your doctor says it is okay.  Do not douche, use tampons, or have sex until your  doctor says it is okay.  For 7-14 days after your procedure, avoid: ? Being very active. ? Exercising. ? Heavy lifting.  Keep all follow-up visits as told by your doctor. This is important. Contact a doctor if:  You have a rash.  You are dizzy or lightheaded.  You feel sick to your stomach (nauseous).  You throw up (vomit).  You have fluid from your vagina (vaginal discharge) that smells bad. Get help right away if:  There are blood clots coming from your vagina.  You have more bleeding than you would have in a normal period. For example, you soak a pad in less than 1 hour.  You have a fever.  You have more and more cramps.  You pass out (faint).  You have pain when peeing.  Your have a lot of pain.  Your pain gets worse.  Your pain does not get better when you take your medicine.  You have blood in your pee.  You throw up (vomit). Summary  After your procedure, take over-the-counter and prescription medicines only as told by your doctor.  Do not douche, use tampons, or have sex until your doctor says it is okay.  For about 7-14 days after your procedure, try not to exercise or lift heavy objects.  Get help right away if you have new symptoms, or if your symptoms become worse. This information is not intended to replace advice given to you by your health care provider. Make sure you discuss any questions you have with your health care provider. Document Released: 12/27/2007 Document Revised: 03/21/2016 Document Reviewed: 03/21/2016 Elsevier Interactive Patient Education  2017 [place] Instructions  Special Instructions/Symptoms: Your throat may feel dry or sore from the anesthesia or the breathing tube placed in your throat during surgery. If this causes discomfort, gargle with warm salt water. The discomfort should disappear within 24 hours.  If you had a scopolamine patch placed behind your ear for the management of  post- operative nausea and/or vomiting:  1. The medication in the patch is effective for 72 hours, after which it should be removed.  Wrap patch in a tissue and discard in the trash. Wash hands thoroughly with soap and water. 2. You may remove the patch earlier than 72 hours if you experience unpleasant side effects which may include dry mouth, dizziness or visual disturbances. 3. Avoid touching the patch. Wash your hands with soap and water after contact with the patch.

## 2017-09-10 NOTE — Anesthesia Preprocedure Evaluation (Signed)
Anesthesia Evaluation  Patient identified by MRN, date of birth, ID band Patient awake    Reviewed: Allergy & Precautions, NPO status , Patient's Chart, lab work & pertinent test results  Airway Mallampati: III  TM Distance: >3 FB Neck ROM: Full    Dental no notable dental hx.    Pulmonary neg pulmonary ROS, former smoker,    Pulmonary exam normal breath sounds clear to auscultation       Cardiovascular hypertension, Normal cardiovascular exam Rhythm:Regular Rate:Normal     Neuro/Psych negative neurological ROS  negative psych ROS   GI/Hepatic negative GI ROS, Neg liver ROS, GERD  ,  Endo/Other  diabetesMorbid obesity  Renal/GU negative Renal ROS  negative genitourinary   Musculoskeletal negative musculoskeletal ROS (+)   Abdominal (+) + obese,   Peds negative pediatric ROS (+)  Hematology negative hematology ROS (+)   Anesthesia Other Findings   Reproductive/Obstetrics negative OB ROS                             Anesthesia Physical Anesthesia Plan  ASA: III  Anesthesia Plan: General   Post-op Pain Management:    Induction: Intravenous  PONV Risk Score and Plan: 4 or greater and Ondansetron, Dexamethasone, Midazolam, Scopolamine patch - Pre-op and Treatment may vary due to age or medical condition  Airway Management Planned: LMA  Additional Equipment:   Intra-op Plan:   Post-operative Plan: Extubation in OR  Informed Consent: I have reviewed the patients History and Physical, chart, labs and discussed the procedure including the risks, benefits and alternatives for the proposed anesthesia with the patient or authorized representative who has indicated his/her understanding and acceptance.   Dental advisory given  Plan Discussed with: CRNA  Anesthesia Plan Comments:         Anesthesia Quick Evaluation

## 2017-09-11 ENCOUNTER — Encounter (HOSPITAL_COMMUNITY): Payer: Self-pay | Admitting: Obstetrics & Gynecology

## 2017-09-11 ENCOUNTER — Telehealth: Payer: Self-pay | Admitting: General Practice

## 2017-09-11 LAB — GLUCOSE, CAPILLARY
Glucose-Capillary: 128 mg/dL — ABNORMAL HIGH (ref 65–99)
Glucose-Capillary: 138 mg/dL — ABNORMAL HIGH (ref 65–99)

## 2017-09-11 NOTE — Telephone Encounter (Signed)
Patient called into front office and left message on nurse voicemail line stating she had surgery done yesterday by Dr Hulan Fray and was given a Rx for percocet. Patient states it is making her itch and she needs an alternative medication.   Called patient & discussed I will send a message to Dr Hulan Fray so that she can send in alternative. Recommended ibuprofen for the pain until alternative is sent in and recommended benadryl for the itching. Patient verbalized understanding & had no questions. Message routed to Dr Hulan Fray.

## 2017-09-12 ENCOUNTER — Telehealth: Payer: Self-pay | Admitting: General Practice

## 2017-09-12 NOTE — Telephone Encounter (Signed)
Patient called and left message stating she is calling regarding her prescription.

## 2017-09-13 ENCOUNTER — Ambulatory Visit (AMBULATORY_SURGERY_CENTER): Payer: Self-pay

## 2017-09-13 VITALS — Ht 68.0 in | Wt 350.4 lb

## 2017-09-13 DIAGNOSIS — Z1211 Encounter for screening for malignant neoplasm of colon: Secondary | ICD-10-CM

## 2017-09-13 MED ORDER — NA SULFATE-K SULFATE-MG SULF 17.5-3.13-1.6 GM/177ML PO SOLN: 1.0000 | Freq: Once | ORAL | 0 refills | Status: AC

## 2017-09-13 NOTE — Progress Notes (Signed)
Per pt, no allergies to soy or egg products.Pt not taking any weight loss meds or using  O2 at home.  Pt refused emmi  Video.

## 2017-09-23 DIAGNOSIS — R69 Illness, unspecified: Secondary | ICD-10-CM | POA: Diagnosis not present

## 2017-09-24 NOTE — Telephone Encounter (Signed)
Left message for patient to call back regarding her medication issue.

## 2017-09-27 ENCOUNTER — Encounter (HOSPITAL_COMMUNITY): Payer: Self-pay | Admitting: *Deleted

## 2017-09-30 ENCOUNTER — Encounter (HOSPITAL_COMMUNITY): Payer: Self-pay

## 2017-09-30 ENCOUNTER — Ambulatory Visit (HOSPITAL_COMMUNITY): Payer: Medicare HMO | Admitting: Anesthesiology

## 2017-09-30 ENCOUNTER — Encounter (HOSPITAL_COMMUNITY)
Admission: RE | Disposition: A | Discharge status: Home / Self Care | Payer: Self-pay | Source: Ambulatory Visit | Attending: Internal Medicine

## 2017-09-30 ENCOUNTER — Ambulatory Visit (HOSPITAL_COMMUNITY)
Admission: RE | Admit: 2017-09-30 | Discharge: 2017-09-30 | Disposition: A | Discharge status: Home / Self Care | Payer: Medicare HMO | Source: Ambulatory Visit | Attending: Internal Medicine | Admitting: Internal Medicine

## 2017-09-30 DIAGNOSIS — Z6841 Body Mass Index (BMI) 40.0 and over, adult: Secondary | ICD-10-CM | POA: Diagnosis not present

## 2017-09-30 DIAGNOSIS — K5730 Diverticulosis of large intestine without perforation or abscess without bleeding: Secondary | ICD-10-CM | POA: Insufficient documentation

## 2017-09-30 DIAGNOSIS — F419 Anxiety disorder, unspecified: Secondary | ICD-10-CM | POA: Insufficient documentation

## 2017-09-30 DIAGNOSIS — Z7984 Long term (current) use of oral hypoglycemic drugs: Secondary | ICD-10-CM | POA: Diagnosis not present

## 2017-09-30 DIAGNOSIS — Z1211 Encounter for screening for malignant neoplasm of colon: Secondary | ICD-10-CM | POA: Insufficient documentation

## 2017-09-30 DIAGNOSIS — K5140 Inflammatory polyps of colon without complications: Secondary | ICD-10-CM

## 2017-09-30 DIAGNOSIS — E119 Type 2 diabetes mellitus without complications: Secondary | ICD-10-CM | POA: Insufficient documentation

## 2017-09-30 DIAGNOSIS — Z87891 Personal history of nicotine dependence: Secondary | ICD-10-CM | POA: Diagnosis not present

## 2017-09-30 DIAGNOSIS — E785 Hyperlipidemia, unspecified: Secondary | ICD-10-CM | POA: Diagnosis not present

## 2017-09-30 DIAGNOSIS — R69 Illness, unspecified: Secondary | ICD-10-CM | POA: Diagnosis not present

## 2017-09-30 DIAGNOSIS — D125 Benign neoplasm of sigmoid colon: Secondary | ICD-10-CM

## 2017-09-30 DIAGNOSIS — D123 Benign neoplasm of transverse colon: Secondary | ICD-10-CM | POA: Insufficient documentation

## 2017-09-30 DIAGNOSIS — D124 Benign neoplasm of descending colon: Secondary | ICD-10-CM

## 2017-09-30 DIAGNOSIS — Z79899 Other long term (current) drug therapy: Secondary | ICD-10-CM | POA: Diagnosis not present

## 2017-09-30 DIAGNOSIS — I10 Essential (primary) hypertension: Secondary | ICD-10-CM | POA: Insufficient documentation

## 2017-09-30 HISTORY — PX: POLYPECTOMY: SHX5525

## 2017-09-30 HISTORY — PX: BIOPSY: SHX5522

## 2017-09-30 HISTORY — PX: COLONOSCOPY WITH PROPOFOL: SHX5780

## 2017-09-30 LAB — GLUCOSE, CAPILLARY: Glucose-Capillary: 129 mg/dL — ABNORMAL HIGH (ref 70–99)

## 2017-09-30 SURGERY — COLONOSCOPY WITH PROPOFOL
Anesthesia: Monitor Anesthesia Care

## 2017-09-30 MED ORDER — LIDOCAINE 2% (20 MG/ML) 5 ML SYRINGE
INTRAMUSCULAR | Status: DC | PRN
  Administered 2017-09-30: 40 mg via INTRAVENOUS
  Administered 2017-09-30: 60 mg via INTRAVENOUS

## 2017-09-30 MED ORDER — PROPOFOL 10 MG/ML IV BOLUS
INTRAVENOUS | Status: DC | PRN
  Administered 2017-09-30 (×9): 50 mg via INTRAVENOUS

## 2017-09-30 MED ORDER — LACTATED RINGERS IV SOLN
INTRAVENOUS | Status: DC
  Administered 2017-09-30: 1000 mL via INTRAVENOUS

## 2017-09-30 MED ORDER — PROPOFOL 10 MG/ML IV BOLUS
INTRAVENOUS | Status: AC
  Filled 2017-09-30: qty 40

## 2017-09-30 MED ORDER — LABETALOL HCL 5 MG/ML IV SOLN
10.0000 mg | Freq: Once | INTRAVENOUS | Status: DC
  Filled 2017-09-30: qty 4

## 2017-09-30 MED ORDER — SODIUM CHLORIDE 0.9 % IV SOLN: INTRAVENOUS | Status: DC

## 2017-09-30 SURGICAL SUPPLY — 21 items

## 2017-09-30 NOTE — Transfer of Care (Signed)
Immediate Anesthesia Transfer of Care Note  Patient: Tina Lara  Procedure(s) Performed: COLONOSCOPY WITH PROPOFOL (N/A )  Patient Location: Endoscopy Unit  Anesthesia Type:MAC  Level of Consciousness: awake and alert   Airway & Oxygen Therapy: Patient Spontanous Breathing and Patient connected to face mask oxygen  Post-op Assessment: Report given to RN and Post -op Vital signs reviewed and stable  Post vital signs: Reviewed and stable  Last Vitals:  Vitals Value Taken Time  BP 119/68 09/30/2017 10:04 AM  Temp    Pulse 77 09/30/2017 10:05 AM  Resp 16 09/30/2017 10:05 AM  SpO2 100 % 09/30/2017 10:05 AM  Vitals shown include unvalidated device data.  Last Pain:  Vitals:   09/30/17 1004  TempSrc:   PainSc: 0-No pain         Complications: No apparent anesthesia complications

## 2017-09-30 NOTE — H&P (Signed)
HPI: Tina Lara is a 62 year old female with a past medical history of obesity, diabetes, hypertension, GERD here for outpatient screening colonoscopy.  No GI complaint.  No prior colonoscopy.  No family history of colon cancer  Tolerated the prep  Past Medical History:  Diagnosis Date  . Anxiety   . Chest pain 08/14/2016  . Complication of anesthesia    Per patient "needs a lot of anesthesia w/surgery" ,I had toruble with a spinal block not taking "  . Diabetes mellitus without complication (HCC)    Type 2  . Full dentures   . GERD (gastroesophageal reflux disease)   . History of surgery on arm    right arm broken  . Hyperlipidemia   . Hypertension    never diagnosed by physician per pt  . Obesity     Past Surgical History:  Procedure Laterality Date  . CERVICAL CONIZATION W/BX N/A 09/10/2017   Procedure: CONIZATION CERVIX WITH BIOPSY - COLD KNIFE;  Surgeon: Emily Filbert, MD;  Location: [facility];  Service: Gynecology;  Laterality: N/A;  . [place]   x 1  . CHOLECYSTECTOMY     gall stones  . DILATION AND CURETTAGE OF UTERUS N/A 09/10/2017   Procedure: DILATATION AND CURETTAGE;  Surgeon: Emily Filbert, MD;  Location: [facility];  Service: Gynecology;  Laterality: N/A;  . MULTIPLE TOOTH EXTRACTIONS     dentures upper and lowers  . TONSILLECTOMY       (Not in an outpatient encounter)  Allergies  Allergen Reactions  . Percocet [Oxycodone-Acetaminophen] Itching    Family History  Problem Relation Age of Onset  . Kidney disease Mother   . Heart disease Mother   . Seizures Father   . Heart disease Father   . Other Father     Social History   Tobacco Use  . Smoking status: Former Smoker    Packs/day: 0.25    Years: 6.00    Pack years: 1.50    Types: Cigarettes  . Smokeless tobacco: Never Used  . Tobacco comment: quit 36 yrs ago  Substance Use Topics  . Alcohol use: No  . Drug use: No    ROS: As per history of present illness, otherwise  negative  BP (!) 190/102   Pulse 80   Temp 97.8 F (36.6 C) (Oral)   Resp 15   Ht 5\' 8"  (1.727 m)   Wt (!) 350 lb (158.8 kg)   LMP 09/03/2017 (Exact Date)   SpO2 98%   BMI 53.22 kg/m  Gen: awake, alert, NAD HEENT: anicteric, op clear CV: RRR Pulm: CTA b/l Abd: soft, obese, NT/ND, +BS throughout Ext: no c/c/e Neuro: nonfocal   RELEVANT LABS AND IMAGING: CBC    Component Value Date/Time   WBC 5.0 09/09/2017 0900   RBC 4.30 09/09/2017 0900   HGB 11.4 (L) 09/09/2017 0900   HCT 36.1 09/09/2017 0900   PLT 188 09/09/2017 0900   MCV 84.0 09/09/2017 0900   MCH 26.5 09/09/2017 0900   MCHC 31.6 09/09/2017 0900   RDW 12.7 09/09/2017 0900   LYMPHSABS 2.7 12/04/2011 1000   MONOABS 0.5 12/04/2011 1000   EOSABS 0.1 12/04/2011 1000   BASOSABS 0.0 12/04/2011 1000    CMP     Component Value Date/Time   NA 136 09/09/2017 0900   NA 140 12/24/2016 1557   K 3.8 09/09/2017 0900   CL 102 09/09/2017 0900   CO2 25 09/09/2017 0900   GLUCOSE 152 (H) 09/09/2017  0900   BUN 13 09/09/2017 0900   BUN 9 12/24/2016 1557   CREATININE 0.90 09/09/2017 0900   CALCIUM 8.7 (L) 09/09/2017 0900   GFRNONAA >60 09/09/2017 0900   GFRAA >60 09/09/2017 0900    ASSESSMENT/PLAN: 62 year old female here for outpatient screening colonoscopy  1.  CRC screening --average risk colonoscopy in the outpatient hospital setting with monitored anesthesia care due to BMI greater than 50. The nature of the procedure, as well as the risks, benefits, and alternatives were carefully and thoroughly reviewed with the patient. Ample time for discussion and questions allowed. The patient understood, was satisfied, and agreed to proceed.

## 2017-09-30 NOTE — Op Note (Signed)
[facility] Patient Name: Tina Lara Procedure Date: 09/30/2017 MRN: [id number] Attending MD: Jerene Bears , MD Date of Birth: 1955/09/16 CSN: [id number] Age: 62 Admit Type: Outpatient Procedure:                Colonoscopy Indications:              Screening for colorectal malignant neoplasm, This                            is the patient's first colonoscopy Providers:                Lajuan Lines. Hilarie Fredrickson, MD, Burtis Junes, RN, Charolette Child,                            Technician Referring MD:              Medicines:                Monitored Anesthesia Care Complications:            No immediate complications. Estimated Blood Loss:     Estimated blood loss was minimal. Procedure:                Pre-Anesthesia Assessment:                           - Prior to the procedure, a History and Physical                            was performed, and patient medications and                            allergies were reviewed. The patient's tolerance of                            previous anesthesia was also reviewed. The risks                            and benefits of the procedure and the sedation                            options and risks were discussed with the patient.                            All questions were answered, and informed consent                            was obtained. Prior Anticoagulants: The patient has                            taken no previous anticoagulant or antiplatelet                            agents. ASA Grade Assessment: III - A patient with  severe systemic disease. After reviewing the risks                            and benefits, the patient was deemed in                            satisfactory condition to undergo the procedure.                           After obtaining informed consent, the colonoscope                            was passed under direct vision. Throughout the                            procedure, the  patient's blood pressure, pulse, and                            oxygen saturations were monitored continuously. The                            was introduced through the anus and advanced to the                            cecum, identified by appendiceal orifice and                            ileocecal valve. The colonoscopy was performed                            without difficulty. The patient tolerated the                            procedure well. The quality of the bowel                            preparation was good. The ileocecal valve,                            appendiceal orifice, and rectum were photographed. Scope In: 9:35:02 AM Scope Out: 9:55:25 AM Scope Withdrawal Time: 0 hours 16 minutes 21 seconds  Total Procedure Duration: 0 hours 20 minutes 23 seconds  Findings:      The digital rectal exam was normal.      Two sessile polyps were found in the transverse colon. The polyps were 4       to 5 mm in size. These polyps were removed with a cold snare. Resection       and retrieval were complete.      A 5 mm polyp was found in the descending colon. The polyp was sessile.       The polyp was removed with a cold snare. Resection and retrieval were       complete.      A 5 mm polypoid lesion, likely an inflamed diverticulum, was found in       the sigmoid  colon. The lesion was sessile. This was biopsied with a cold       forceps for histology to exclude adenoma.      Multiple small and large-mouthed diverticula were found in the sigmoid       colon, descending colon and ascending colon.      The retroflexed view of the distal rectum and anal verge was normal and       showed no anal or rectal abnormalities. Impression:               - Two 4 to 5 mm polyps in the transverse colon,                            removed with a cold snare. Resected and retrieved.                           - One 5 mm polyp in the descending colon, removed                            with a cold  snare. Resected and retrieved.                           - Inflamed sigmoid diverticulum in the sigmoid                            colon. Biopsied to exclude adenomatous change.                           - Mild diverticulosis in the sigmoid colon, in the                            descending colon and in the ascending colon.                           - The distal rectum and anal verge are normal on                            retroflexion view. Moderate Sedation:      N/A Recommendation:           - Patient has a contact number available for                            emergencies. The signs and symptoms of potential                            delayed complications were discussed with the                            patient. Return to normal activities tomorrow.                            Written discharge instructions were provided to the  patient.                           - Resume previous diet.                           - Continue present medications.                           - Await pathology results.                           - Repeat colonoscopy is recommended. The                            colonoscopy date will be determined after pathology                            results from today's exam become available for                            review. Procedure Code(s):        --- Professional ---                           [phone], Colonoscopy, flexible; with removal of                            tumor(s), polyp(s), or other lesion(s) by snare                            technique                           45380, 78, Colonoscopy, flexible; with biopsy,                            single or multiple Diagnosis Code(s):        --- Professional ---                           Z12.11, Encounter for screening for malignant                            neoplasm of colon                           D12.3, Benign neoplasm of transverse colon (hepatic                             flexure or splenic flexure)                           D12.4, Benign neoplasm of descending colon                           D49.0, Neoplasm of unspecified behavior of  digestive system                           K57.30, Diverticulosis of large intestine without                            perforation or abscess without bleeding CPT copyright 2017 American Medical Association. All rights reserved. The codes documented in this report are preliminary and upon coder review may  be revised to meet current compliance requirements. Jerene Bears, MD 09/30/2017 10:12:03 AM This report has been signed electronically. Number of Addenda: 0

## 2017-09-30 NOTE — Discharge Instructions (Signed)

## 2017-09-30 NOTE — Anesthesia Preprocedure Evaluation (Addendum)
Anesthesia Evaluation  Patient identified by MRN, date of birth, ID band Patient awake    Reviewed: Allergy & Precautions, NPO status , Patient's Chart, lab work & pertinent test results  Airway Mallampati: II  TM Distance: >3 FB Neck ROM: Full    Dental  (+) Lower Dentures, Upper Dentures, Dental Advisory Given Pt requested to leave dentures in:   Pulmonary former smoker,    Pulmonary exam normal breath sounds clear to auscultation       Cardiovascular hypertension, Pt. on medications Normal cardiovascular exam Rhythm:Regular Rate:Normal  ECG: NSR, rate 87, LVH   Neuro/Psych Anxiety negative neurological ROS     GI/Hepatic Neg liver ROS, GERD  Medicated and Controlled,  Endo/Other  diabetes, Oral Hypoglycemic AgentsMorbid obesitySuper obese  Renal/GU negative Renal ROS     Musculoskeletal hcg negative   Abdominal (+) + obese,   Peds  Hematology HLD   Anesthesia Other Findings   Reproductive/Obstetrics                           Anesthesia Physical Anesthesia Plan  ASA: IV  Anesthesia Plan: MAC   Post-op Pain Management:    Induction: Intravenous  PONV Risk Score and Plan: 2 and Treatment may vary due to age or medical condition and Propofol infusion  Airway Management Planned: Simple Face Mask  Additional Equipment:   Intra-op Plan:   Post-operative Plan:   Informed Consent: I have reviewed the patients History and Physical, chart, labs and discussed the procedure including the risks, benefits and alternatives for the proposed anesthesia with the patient or authorized representative who has indicated his/her understanding and acceptance.   Dental advisory given  Plan Discussed with: CRNA  Anesthesia Plan Comments:        Anesthesia Quick Evaluation

## 2017-09-30 NOTE — Anesthesia Procedure Notes (Signed)
Procedure Name: MAC Date/Time: 09/30/2017 9:22 AM Performed by: Cynda Familia, CRNA Pre-anesthesia Checklist: Patient identified, Emergency Drugs available, Suction available, Patient being monitored and Timeout performed Patient Re-evaluated:Patient Re-evaluated prior to induction Oxygen Delivery Method: Simple face mask Placement Confirmation: positive ETCO2 and breath sounds checked- equal and bilateral Dental Injury: Teeth and Oropharynx as per pre-operative assessment

## 2017-10-01 ENCOUNTER — Encounter (HOSPITAL_COMMUNITY): Payer: Self-pay | Admitting: Internal Medicine

## 2017-10-01 NOTE — Anesthesia Postprocedure Evaluation (Signed)
Anesthesia Post Note  Patient: [employer]  Procedure(s) Performed: COLONOSCOPY WITH PROPOFOL (N/A ) BIOPSY POLYPECTOMY     Patient location during evaluation: PACU Anesthesia Type: MAC Level of consciousness: awake and alert Pain management: pain level controlled Vital Signs Assessment: post-procedure vital signs reviewed and stable Respiratory status: spontaneous breathing, nonlabored ventilation, respiratory function stable and patient connected to nasal cannula oxygen Cardiovascular status: stable and blood pressure returned to baseline Postop Assessment: no apparent nausea or vomiting Anesthetic complications: no    Last Vitals:  Vitals:   09/30/17 1004 09/30/17 1010  BP: 119/68 120/64  Pulse: 80 82  Resp: 16 15  Temp: 36.5 C   SpO2: 100% 100%    Last Pain:  Vitals:   09/30/17 1010  TempSrc:   PainSc: 0-No pain                 Adianna Darwin P Tobey Lippard

## 2017-10-07 ENCOUNTER — Encounter: Payer: Self-pay | Admitting: Internal Medicine

## 2017-10-28 ENCOUNTER — Encounter: Payer: Self-pay | Admitting: Internal Medicine

## 2017-10-28 ENCOUNTER — Other Ambulatory Visit: Payer: Self-pay

## 2017-10-28 ENCOUNTER — Ambulatory Visit (INDEPENDENT_AMBULATORY_CARE_PROVIDER_SITE_OTHER): Payer: Medicare HMO | Admitting: Internal Medicine

## 2017-10-28 VITALS — BP 170/89 | HR 80 | Temp 98.0°F | Ht 69.0 in | Wt 355.3 lb

## 2017-10-28 DIAGNOSIS — I10 Essential (primary) hypertension: Secondary | ICD-10-CM | POA: Diagnosis not present

## 2017-10-28 DIAGNOSIS — K219 Gastro-esophageal reflux disease without esophagitis: Secondary | ICD-10-CM

## 2017-10-28 DIAGNOSIS — Z6841 Body Mass Index (BMI) 40.0 and over, adult: Secondary | ICD-10-CM

## 2017-10-28 DIAGNOSIS — I351 Nonrheumatic aortic (valve) insufficiency: Secondary | ICD-10-CM | POA: Diagnosis not present

## 2017-10-28 DIAGNOSIS — Z79899 Other long term (current) drug therapy: Secondary | ICD-10-CM

## 2017-10-28 DIAGNOSIS — E669 Obesity, unspecified: Secondary | ICD-10-CM

## 2017-10-28 DIAGNOSIS — Z598 Other problems related to housing and economic circumstances: Secondary | ICD-10-CM

## 2017-10-28 DIAGNOSIS — Z7984 Long term (current) use of oral hypoglycemic drugs: Secondary | ICD-10-CM

## 2017-10-28 DIAGNOSIS — E119 Type 2 diabetes mellitus without complications: Secondary | ICD-10-CM | POA: Diagnosis not present

## 2017-10-28 DIAGNOSIS — E1142 Type 2 diabetes mellitus with diabetic polyneuropathy: Secondary | ICD-10-CM

## 2017-10-28 LAB — POCT GLYCOSYLATED HEMOGLOBIN (HGB A1C): Hemoglobin A1C: 7.9 % — AB (ref 4.0–5.6)

## 2017-10-28 LAB — GLUCOSE, CAPILLARY: Glucose-Capillary: 140 mg/dL — ABNORMAL HIGH (ref 70–99)

## 2017-10-28 MED ORDER — AMLODIPINE BESYLATE 5 MG PO TABS: 5.0000 mg | ORAL_TABLET | Freq: Every day | ORAL | 3 refills | Status: DC

## 2017-10-28 MED ORDER — METFORMIN HCL ER 500 MG PO TB24: 500.0000 mg | ORAL_TABLET | Freq: Every day | ORAL | 5 refills | Status: DC

## 2017-10-28 NOTE — Assessment & Plan Note (Addendum)
Continues to be uncontrolled. Also endorsing headache and occassional DOE. Denies chest pain. Reports adherence with lisinopril and HCTZ. EKG with LVH. Echo moderate concentric LVH, low normal LVEF 55%. Grade 2 diastolic dysfunction with elevated filling pressures. Mildly dilated left atrium. Mild aortic insufficiency. Discussed lifestyle changes at length. She feels stress/anxiety are elevating her BP. Her AC in her house broke, she's been keeping her 3 young granchildren for the summer, and dealing with unpaid hospital bills. Patient motivated to exercise more and eat healthier. On exam, lungs CTAB, heart RRR, no LEE.  Plan: - check home BP and bring log book to next appt - continue lisinopril 20mg , HCTZ 25mg  - START amlodipine 5mg  daily  - can consider combination pill in the future  - f/u 1-2 months

## 2017-10-28 NOTE — Assessment & Plan Note (Addendum)
a1c 7.9 today, mildly improved from a1c of 8.3 five months ago. Prescribed metformin 1,017m BID but patient reports inability to tolerate it due to GI side effects. Endorses occasional peripheral neuropathy. Denies vision changes. Discussed at length lifestyle changes. She has joined a gym and no longer uses her cane. Her future son-in-law is going into pharmacy and influences her views on her medications. She has met with DButch Pennyin the past and has cut out sodas. Sweets are her downfall.   Plan: - switch to metformin XR 5025mdaily, given schedule to self-titrate up weekly  - continue lifestyle changes, discussed that she would likely need to be on a second diabetes medicine (GLP-1 agonist) that would also help her lose weight and lower BP - a1c today - f/u 1-2 months

## 2017-10-28 NOTE — Progress Notes (Signed)
Internal Medicine Clinic Attending  I saw and evaluated the patient.  I personally confirmed the key portions of the history and exam documented by Dr. Vogel and I reviewed pertinent patient test results.  The assessment, diagnosis, and plan were formulated together and I agree with the documentation in the resident's note.  Alexander Raines, M.D., Ph.D.  

## 2017-10-28 NOTE — Patient Instructions (Signed)
It was nice seeing you today. Thank you for choosing [facility] for your Primary Care.   Today we talked about:  1) Diabetes: Start taking the different kind of Metformin (XR extended release) 500mg  daily and increase the dose each week per the schedule I gave you. If you start having GI side effects, go back down to the lowest dose you were able to tolerate and give [place] a call 2) High Blood Pressure: Start taking amlodipine 5mg  daily with your lisinopril and HCTZ. Check blood pressure at home and write them down. Bring the list of blood pressures with you to your next appointment.   FOLLOW-UP INSTRUCTIONS When: 1-2 months For: blood pressure, diabetes What to bring: all medications  Please contact the clinic if you have any problems, or need to be seen sooner.

## 2017-10-28 NOTE — Progress Notes (Signed)
   CC: high blood pressure  HPI:  Tina Lara is a 62 y.o. female with obesity, diabetes, HTN, GERD who presents for management of HTN and diabetes.  Please see problem based charting for full details of HPI.  Past Medical History:  Diagnosis Date  . Anxiety   . Chest pain 08/14/2016  . Complication of anesthesia    Per patient "needs a lot of anesthesia w/surgery" ,I had toruble with a spinal block not taking "  . Diabetes mellitus without complication (HCC)    Type 2  . Full dentures   . GERD (gastroesophageal reflux disease)   . History of surgery on arm    right arm broken  . Hyperlipidemia   . Hypertension    never diagnosed by physician per pt  . Obesity     Physical Exam:  Vitals:   10/28/17 0847  BP: (!) 170/89  Pulse: 80  Temp: 98 F (36.7 C)  TempSrc: Oral  SpO2: 100%  Weight: (!) 355 lb 4.8 oz (161.2 kg)  Height: 5\' 9"  (1.753 m)   Gen: Well appearing, NAD, obese CV: RRR, no murmurs Pulm: Normal effort, CTA throughout, no wheezing Ext: Warm, no edema, normal joints   Assessment & Plan:   See Encounters Tab for problem based charting.  Patient seen with Dr. Rebeca Alert

## 2018-01-02 DIAGNOSIS — H3530 Unspecified macular degeneration: Secondary | ICD-10-CM | POA: Diagnosis not present

## 2018-01-02 DIAGNOSIS — H2511 Age-related nuclear cataract, right eye: Secondary | ICD-10-CM | POA: Diagnosis not present

## 2018-01-02 DIAGNOSIS — H538 Other visual disturbances: Secondary | ICD-10-CM | POA: Diagnosis not present

## 2018-01-02 LAB — HM DIABETES EYE EXAM

## 2018-02-17 ENCOUNTER — Encounter: Payer: Medicare HMO | Admitting: Internal Medicine

## 2018-05-12 ENCOUNTER — Telehealth: Payer: Self-pay | Admitting: Internal Medicine

## 2018-05-12 ENCOUNTER — Encounter: Payer: Self-pay | Admitting: Internal Medicine

## 2018-05-12 NOTE — Telephone Encounter (Signed)
Called patient to schedule an appt with Dr. Philipp Ovens, but no answer.  Left detailed message asking to please give me a call back.  Also will mail patient letter requesting the same.

## 2018-05-19 ENCOUNTER — Other Ambulatory Visit: Payer: Self-pay | Admitting: Internal Medicine

## 2018-05-19 DIAGNOSIS — I10 Essential (primary) hypertension: Secondary | ICD-10-CM

## 2018-05-20 NOTE — Telephone Encounter (Signed)
Approved 15 day supply of HCTZ. Patient needs to be seen. Please have her schedule a follow up appointment in the next 2 weeks. [facility] is fine if I am unavailable. She has no showed her canceled her last few appointments with me. I have not seen her in over a year. If she does not follow up, cannot approve any more refills until she is seen. Thank you!

## 2018-05-20 NOTE — Telephone Encounter (Addendum)
Detailed message left on pt's recorder stating her pcp her refilled one of her medications for a "limited supply" and that pt will need to call back and make an appt to be seen for additional refills.Regenia Skeeter, Keston Seever Cassady2/18/20201:28 PM   Of note, message was also left by front office staff on 05/12/18 for return call. [place] office also mailed letter.  Will await call back from pt.Marland Kitchen

## 2018-06-16 ENCOUNTER — Telehealth: Payer: Self-pay | Admitting: Dietician

## 2018-06-16 DIAGNOSIS — J22 Unspecified acute lower respiratory infection: Secondary | ICD-10-CM

## 2018-06-17 NOTE — Telephone Encounter (Signed)
In the 14 days prior to your symptom onset, did you travel to an area with a high prevalence of COVID?   yes, 3/2- 3/9 [place], [place], [place], [place] cruises,   In the 14 days prior to your symptom onset, did you have close contact with someone with COVID?   "My daughter had a cold but no one that I know had COVID, I had a little cold before I left then I felt good on the trip, I came back on Friday and by Sunday at church I felt bad, coughing and feeling bad"  Do you have a fever?  " no, im taking mucinex and tylenol so I don't know really"  Do you have a cough?    yes, it is dry at this time, at first cough was productive, a little gray but now it is dry"  Do you have shortness of breath more than normal?   "short of breath has been gradual, when I lay down I notice it"  Do you have chest pain?  "no"  Are you able to eat and drink normally?  "yes"  Have you seen a physician for these symptoms?  "no"  Action            sending to attending of the afternoon and  dr Software engineer, dr Philipp Ovens also gave pt info for 911 and how to reach IM resident on call

## 2018-06-17 NOTE — Telephone Encounter (Signed)
Also reminded pt of general health advice, she verbalize knowledge of the list and states she is doing so

## 2018-06-17 NOTE — Addendum Note (Signed)
Addended by: Lalla Brothers T on: 06/17/2018 01:40 PM   Modules accepted: Orders

## 2018-06-17 NOTE — Telephone Encounter (Signed)
I notified infection prevention, but they said we did the screening fine so she just needs to show up and will be tested.

## 2018-06-17 NOTE — Telephone Encounter (Signed)
Has had a cold off and on, went on a cruise the first of the month and is concerned she may have the coronavirus. She wants to know if she can be tested. I told her I would have our triage nurse to call her.   She reports annual eye exam in October 2019 by Dr. Gevena Cotton. I will call his office to request the results.

## 2018-06-17 NOTE — Telephone Encounter (Signed)
Spoke with Tina Lara. [place] cruise ship a 1 week ago. Developed respiratory symptoms on Sunday. Subjective fevers and dry cough. I would say this is a high risk exposure, which consistent symptoms for COVID.   She is doing well with no shortness of breath. I told her to isolate at home, which she agrees to. Will order COVID testing through the drive thru on [address]. She knows where it is, heard about it on the news, she is going to comply with testing and isolation until she hears the results.   Lalla Brothers, MD

## 2018-06-17 NOTE — Telephone Encounter (Signed)
On conference call now. They would like, but not require, the CDC form and isolation form (but pt not here to sign) before they go to testing site.

## 2018-06-18 ENCOUNTER — Encounter: Payer: Self-pay | Admitting: *Deleted

## 2018-06-18 ENCOUNTER — Other Ambulatory Visit: Payer: Self-pay | Admitting: *Deleted

## 2018-06-18 DIAGNOSIS — R6889 Other general symptoms and signs: Secondary | ICD-10-CM

## 2018-06-23 ENCOUNTER — Telehealth: Payer: Self-pay | Admitting: Internal Medicine

## 2018-06-23 NOTE — Telephone Encounter (Signed)
Results are not back. Order still says "active".

## 2018-06-23 NOTE — Telephone Encounter (Signed)
Pt is calling about COVID-19 results; pt contact [phone]

## 2018-06-24 LAB — NOVEL CORONAVIRUS, NAA: SARS-CoV-2, NAA: NOT DETECTED

## 2018-06-24 NOTE — Telephone Encounter (Signed)
COVID result negative, came back today. I let the patient know, she saw it on mychart earlier today as well. She is feeling better, recovered nicely from this viral illness NOS.

## 2019-02-01 DIAGNOSIS — Z0100 Encounter for examination of eyes and vision without abnormal findings: Secondary | ICD-10-CM | POA: Diagnosis not present

## 2019-02-01 DIAGNOSIS — E119 Type 2 diabetes mellitus without complications: Secondary | ICD-10-CM | POA: Diagnosis not present

## 2019-05-13 ENCOUNTER — Encounter: Payer: Self-pay | Admitting: Internal Medicine

## 2019-05-13 ENCOUNTER — Ambulatory Visit (INDEPENDENT_AMBULATORY_CARE_PROVIDER_SITE_OTHER): Payer: Medicare HMO | Admitting: Internal Medicine

## 2019-05-13 ENCOUNTER — Other Ambulatory Visit: Payer: Self-pay

## 2019-05-13 VITALS — BP 130/65 | HR 82 | Temp 98.1°F | Ht 69.0 in | Wt 367.7 lb

## 2019-05-13 DIAGNOSIS — Z2821 Immunization not carried out because of patient refusal: Secondary | ICD-10-CM

## 2019-05-13 DIAGNOSIS — Z87891 Personal history of nicotine dependence: Secondary | ICD-10-CM

## 2019-05-13 DIAGNOSIS — E785 Hyperlipidemia, unspecified: Secondary | ICD-10-CM | POA: Diagnosis not present

## 2019-05-13 DIAGNOSIS — I10 Essential (primary) hypertension: Secondary | ICD-10-CM | POA: Diagnosis not present

## 2019-05-13 DIAGNOSIS — Z9111 Patient's noncompliance with dietary regimen: Secondary | ICD-10-CM | POA: Diagnosis not present

## 2019-05-13 DIAGNOSIS — Z79899 Other long term (current) drug therapy: Secondary | ICD-10-CM | POA: Diagnosis not present

## 2019-05-13 DIAGNOSIS — E119 Type 2 diabetes mellitus without complications: Secondary | ICD-10-CM | POA: Diagnosis not present

## 2019-05-13 DIAGNOSIS — Z0000 Encounter for general adult medical examination without abnormal findings: Secondary | ICD-10-CM

## 2019-05-13 LAB — POCT GLYCOSYLATED HEMOGLOBIN (HGB A1C): Hemoglobin A1C: 8.6 % — AB (ref 4.0–5.6)

## 2019-05-13 LAB — GLUCOSE, CAPILLARY: Glucose-Capillary: 219 mg/dL — ABNORMAL HIGH (ref 70–99)

## 2019-05-13 MED ORDER — LISINOPRIL 10 MG PO TABS: 10.0000 mg | ORAL_TABLET | Freq: Every day | ORAL | 2 refills | Status: DC

## 2019-05-13 MED ORDER — SEMAGLUTIDE 3 MG PO TABS: 3.0000 mg | ORAL_TABLET | Freq: Every day | ORAL | 2 refills | Status: DC

## 2019-05-13 NOTE — Assessment & Plan Note (Addendum)
Patient has been of all of her medications for "months". Blood pressure is elevated today, 146/70 and 130/65 on recheck. She was previously prescribed lisinopril 20 mg daily and HCTZ 25 mg daily. She is agreeable to restarting low dose lisinopril. Checking BMP today as well. -- Restart lisinopril 10 mg daily  -- F/u BMP  ADDENDUM: BMP within normal limits.

## 2019-05-13 NOTE — Progress Notes (Signed)
Subjective:   Patient ID: [name] female   DOB: 05/03/1955 64 y.o.   MRN: [id number]  HPI: [name] is a 64 y.o. female with past medical history outlined below here for BP and DM follow up. For the details of today's visit, please refer to the assessment and plan.   Past Medical History:  Diagnosis Date  . Anxiety   . Chest pain 08/14/2016  . Complication of anesthesia    Per patient "needs a lot of anesthesia w/surgery" ,I had toruble with a spinal block not taking "  . Diabetes mellitus without complication (HCC)    Type 2  . Full dentures   . GERD (gastroesophageal reflux disease)   . History of surgery on arm    right arm broken  . Hyperlipidemia   . Hypertension    never diagnosed by physician per pt  . Obesity    Current Outpatient Medications  Medication Sig Dispense Refill  . acetaminophen (TYLENOL) 500 MG tablet Take 1,500 mg by mouth every 6 (six) hours as needed for moderate pain or headache.     Marland Kitchen amLODipine (NORVASC) 5 MG tablet Take 1 tablet (5 mg total) by mouth daily. 30 tablet 3  . aspirin 81 MG EC tablet Take 1 tablet (81 mg total) by mouth daily. (Patient taking differently: Take 81 mg by mouth every other day. ) 30 tablet 0  . atorvastatin (LIPITOR) 20 MG tablet Take 1 tablet (20 mg total) by mouth daily. 30 tablet 11  . Hypromellose (ARTIFICIAL TEARS OP) Place 1 drop into both eyes daily as needed (for dry eyes).    Marland Kitchen ibuprofen (ADVIL,MOTRIN) 200 MG tablet Take 400 mg by mouth every 6 (six) hours as needed for headache or moderate pain.     Marland Kitchen lisinopril (ZESTRIL) 10 MG tablet Take 1 tablet (10 mg total) by mouth daily. 30 tablet 2  . Menthol, Topical Analgesic, (BIOFREEZE EX) Apply 1 application topically daily as needed (back pain).    . metFORMIN (GLUCOPHAGE-XR) 500 MG 24 hr tablet Take 1 tablet (500 mg total) by mouth daily with breakfast. 30 tablet 5  . pantoprazole (PROTONIX) 40 MG tablet Take 1 tablet (40 mg total) by mouth daily.  30 tablet 2  . Semaglutide 3 MG TABS Take 3 mg by mouth daily. 30 tablet 2   No current facility-administered medications for this visit.   Family History  Problem Relation Age of Onset  . Kidney disease Mother   . Heart disease Mother   . Seizures Father   . Heart disease Father   . Other Father    Social History   Socioeconomic History  . Marital status: Divorced    Spouse name: Not on file  . Number of children: Not on file  . Years of education: Not on file  . Highest education level: Not on file  Occupational History  . Not on file  Tobacco Use  . Smoking status: Former Smoker    Packs/day: 0.25    Years: 6.00    Pack years: 1.50    Types: Cigarettes  . Smokeless tobacco: Never Used  . Tobacco comment: quit 36 yrs ago  Substance and Sexual Activity  . Alcohol use: No  . Drug use: No  . Sexual activity: Not Currently    Birth control/protection: None  Other Topics Concern  . Not on file  Social History Narrative  . Not on file   Social Determinants of Health   Financial Resource  Strain:   . Difficulty of Paying Living Expenses: Not on file  Food Insecurity:   . Worried About Charity fundraiser in the Last Year: Not on file  . Ran Out of Food in the Last Year: Not on file  Transportation Needs:   . Lack of Transportation (Medical): Not on file  . Lack of Transportation (Non-Medical): Not on file  Physical Activity:   . Days of Exercise per Week: Not on file  . Minutes of Exercise per Session: Not on file  Stress:   . Feeling of Stress : Not on file  Social Connections:   . Frequency of Communication with Friends and Family: Not on file  . Frequency of Social Gatherings with Friends and Family: Not on file  . Attends Religious Services: Not on file  . Active Member of Clubs or Organizations: Not on file  . Attends Archivist Meetings: Not on file  . Marital Status: Not on file    Review of Systems: Review of Systems  Respiratory:  Negative for shortness of breath.   Cardiovascular: Negative for chest pain.     Objective:  Physical Exam:  Vitals:   05/13/19 0814 05/13/19 0903  BP: (!) 146/70 130/65  Pulse: 92 82  Temp: 98.1 F (36.7 C)   TempSrc: Oral   SpO2: 96%   Weight: (!) 367 lb 11.2 oz (166.8 kg)   Height: 5\' 9"  (1.753 m)     Physical Exam  Constitutional: She is oriented to person, place, and time and well-developed, well-nourished, and in no distress. No distress.  Cardiovascular: Normal rate, regular rhythm and normal heart sounds.  Pulmonary/Chest: Effort normal and breath sounds normal. No respiratory distress.  Neurological: She is alert and oriented to person, place, and time.  Skin: She is not diaphoretic.  Psychiatric: Affect and judgment normal.     Assessment & Plan:   See Encounters Tab for problem based charting.

## 2019-05-13 NOTE — Patient Instructions (Addendum)
Ms. [place],  It was a pleasure to see you. For your diabetes, I have started you on a different medication called semaglutide. Please take this once a day on an empty stomach, 30 minutes before breakfast.   For your blood pressure, I have restarted your lisinopril. Please take this once a day.   I will call you with the results of your blood work. Someone will be in touch to schedule your mammogram.   If you have any questions or concerns, call our clinic at [phone] or after hours call [phone] and ask for the internal medicine resident on call.   Thank you!  Dr. Philipp Ovens

## 2019-05-13 NOTE — Assessment & Plan Note (Signed)
Uncontrolled, Hgb A1c is 8.6. She has been off all of her medications for "months". Admits to dietary indiscretion as well. She is hesitant to restart metformin due to GI side effects. She can't remember if this improved when we switched her to the long acting formulation. She is agreeable to trying a different medication. Will start semaglutide 3 mg daily. Uptitrate at follow up. She will call if the medication is not covered by insurance, in which case she will try extended release metformin again.  -- Start semaglutide 3 mg daily; increase to 7 mg at follow up -- Follow up 3 months

## 2019-05-13 NOTE — Assessment & Plan Note (Addendum)
Previously on lipitor 20 mg. Has not taken this in months. Will repeat Lipid panel today and restart statin pending results.   ADDENDUM: Lipid panel with LDL of 100, 10 year ASCVD risk is elevated at 16%. Called patient with results, left VM with plan to resume atorvastatin. Will increase to high intensity dosing, 40 mg daily.

## 2019-05-13 NOTE — Assessment & Plan Note (Signed)
Despite counseling, patient adamantly refused flu shot. Placed order for screening mammogram. Last mammo in 2013 was BI-RADS category 1.

## 2019-05-14 ENCOUNTER — Telehealth: Payer: Self-pay | Admitting: *Deleted

## 2019-05-14 DIAGNOSIS — E119 Type 2 diabetes mellitus without complications: Secondary | ICD-10-CM

## 2019-05-14 LAB — LIPID PANEL
Chol/HDL Ratio: 3.3 ratio (ref 0.0–4.4)
Cholesterol, Total: 166 mg/dL (ref 100–199)
HDL: 50 mg/dL (ref >39)
LDL Chol Calc (NIH): 100 mg/dL — ABNORMAL HIGH (ref 0–99)
Triglycerides: 86 mg/dL (ref 0–149)
VLDL Cholesterol Cal: 16 mg/dL (ref 5–40)

## 2019-05-14 LAB — BMP8+ANION GAP
Anion Gap: 14 mmol/L (ref 10.0–18.0)
BUN/Creatinine Ratio: 12 (ref 12–28)
BUN: 12 mg/dL (ref 8–27)
CO2: 25 mmol/L (ref 20–29)
Calcium: 9.3 mg/dL (ref 8.7–10.3)
Chloride: 101 mmol/L (ref 96–106)
Creatinine, Ser: 0.97 mg/dL (ref 0.57–1.00)
GFR calc Af Amer: 73 mL/min/{1.73_m2} (ref >59)
GFR calc non Af Amer: 63 mL/min/{1.73_m2} (ref >59)
Glucose: 217 mg/dL — ABNORMAL HIGH (ref 65–99)
Potassium: 4.8 mmol/L (ref 3.5–5.2)
Sodium: 140 mmol/L (ref 134–144)

## 2019-05-14 MED ORDER — ATORVASTATIN CALCIUM 40 MG PO TABS: 40.0000 mg | ORAL_TABLET | Freq: Every day | ORAL | 3 refills | Status: DC

## 2019-05-14 MED ORDER — METFORMIN HCL ER 500 MG PO TB24: 500.0000 mg | ORAL_TABLET | Freq: Every day | ORAL | 2 refills | Status: DC

## 2019-05-14 NOTE — Addendum Note (Signed)
Addended by: Jodean Lima on: 05/14/2019 10:55 AM   Modules accepted: Orders

## 2019-05-14 NOTE — Telephone Encounter (Signed)
Ok, sent prescription for 500 mg extended release metformin. Please tell her to take this once a day with breakfast.

## 2019-05-14 NOTE — Telephone Encounter (Signed)
Pt states that the semaglutide was 900. Needs something cheaper, much cheaper.

## 2019-05-14 NOTE — Telephone Encounter (Signed)
Cad pt, gave her message, she was thankful

## 2019-07-01 ENCOUNTER — Ambulatory Visit: Payer: Medicare HMO

## 2020-01-08 DIAGNOSIS — E11319 Type 2 diabetes mellitus with unspecified diabetic retinopathy without macular edema: Secondary | ICD-10-CM | POA: Diagnosis not present

## 2020-01-08 DIAGNOSIS — H3530 Unspecified macular degeneration: Secondary | ICD-10-CM | POA: Diagnosis not present

## 2020-01-08 DIAGNOSIS — H04129 Dry eye syndrome of unspecified lacrimal gland: Secondary | ICD-10-CM | POA: Diagnosis not present

## 2020-01-08 DIAGNOSIS — H538 Other visual disturbances: Secondary | ICD-10-CM | POA: Diagnosis not present

## 2020-01-19 ENCOUNTER — Encounter (INDEPENDENT_AMBULATORY_CARE_PROVIDER_SITE_OTHER): Payer: Medicare HMO | Admitting: Ophthalmology

## 2020-02-04 NOTE — Progress Notes (Signed)
[facility] Note  02/08/2020     CHIEF COMPLAINT Patient presents for Retina Evaluation   HISTORY OF PRESENT ILLNESS: Tina Lara is a 64 y.o. female who presents to the clinic today for:   HPI    Retina Evaluation    In both eyes.  This started months ago.  Duration of months.  Context:  distance vision, mid-range vision and near vision.  Treatments tried include no treatments.  I, the attending physician,  performed the HPI with the patient and updated documentation appropriately.          Comments    64 y/o female pt referred by Dr. Gevena Cotton for ret eval OU (worse OD).  Saw Dr. Frederico Hamman 10.8.21.  Pt hx of DM x 5 yrs, BDR and ARMD.  VA blurred OU cc.  Denies pain, FOL, floaters.  No gtts.  BS and A1C unknown.       Last edited by Bernarda Caffey, MD on 02/08/2020 12:50 PM. (History)    pt is here on the referral of Dr. Frederico Hamman for retinal eval, pt states she went to see him for a routine exam, pt states Dr. Frederico Hamman told her he had made her an appt at a retina specialist 2 years ago, but pt was unaware of that appt, pt states he told her that one of her eyes was getting worse than the other, pt endorses being diabetic and hypertensive, she is taking metformin for diabetes, her last A1c was 8.6 in February 2021  Referring physician: Gevena Cotton, MD [address] [place],  [place] [address]  HISTORICAL INFORMATION:   Selected notes from the [facility] Referred by Dr. Frederico Hamman for retina eval  LEE: 01/08/2020 Ocular Hx-BDR, NS OU PMH-DM    CURRENT MEDICATIONS: Current Outpatient Medications (Ophthalmic Drugs)  Medication Sig  . Hypromellose (ARTIFICIAL TEARS OP) Place 1 drop into both eyes daily as needed (for dry eyes).   No current facility-administered medications for this visit. (Ophthalmic Drugs)   Current Outpatient Medications (Other)  Medication Sig  . acetaminophen (TYLENOL) 500 MG tablet Take  1,500 mg by mouth every 6 (six) hours as needed for moderate pain or headache.   Marland Kitchen amLODipine (NORVASC) 5 MG tablet Take 1 tablet (5 mg total) by mouth daily.  Marland Kitchen aspirin 81 MG EC tablet Take 1 tablet (81 mg total) by mouth daily. (Patient taking differently: Take 81 mg by mouth every other day. )  . atorvastatin (LIPITOR) 40 MG tablet Take 1 tablet (40 mg total) by mouth daily.  Marland Kitchen ibuprofen (ADVIL,MOTRIN) 200 MG tablet Take 400 mg by mouth every 6 (six) hours as needed for headache or moderate pain.   Marland Kitchen lisinopril (ZESTRIL) 10 MG tablet Take 1 tablet (10 mg total) by mouth daily.  . Menthol, Topical Analgesic, (BIOFREEZE EX) Apply 1 application topically daily as needed (back pain).  . metFORMIN (GLUCOPHAGE-XR) 500 MG 24 hr tablet Take 1 tablet (500 mg total) by mouth daily with breakfast.  . pantoprazole (PROTONIX) 40 MG tablet Take 1 tablet (40 mg total) by mouth daily.   No current facility-administered medications for this visit. (Other)      REVIEW OF SYSTEMS: ROS    Positive for: Gastrointestinal, Endocrine, Eyes   Negative for: Constitutional, Neurological, Skin, Genitourinary, Musculoskeletal, HENT, Cardiovascular, Respiratory, Psychiatric, Allergic/Imm, Heme/Lymph   Last edited by Matthew Folks, COA on 02/08/2020  8:35 AM. (History)       ALLERGIES  Allergies  Allergen Reactions  . Percocet [Oxycodone-Acetaminophen] Itching    PAST MEDICAL HISTORY Past Medical History:  Diagnosis Date  . Anxiety   . Cataract    NS OD  . Chest pain 08/14/2016  . Complication of anesthesia    Per patient "needs a lot of anesthesia w/surgery" ,I had toruble with a spinal block not taking "  . Diabetes mellitus without complication (HCC)    Type 2  . Diabetic retinopathy ([place])    BDR  . Full dentures   . GERD (gastroesophageal reflux disease)   . History of surgery on arm    right arm broken  . Hyperlipidemia   . Hypertension    never diagnosed by physician per pt  . Macular  degeneration   . Obesity    Past Surgical History:  Procedure Laterality Date  . BIOPSY  09/30/2017   Procedure: BIOPSY;  Surgeon: Jerene Bears, MD;  Location: [facility];  Service: Gastroenterology;;  . CERVICAL CONIZATION W/BX N/A 09/10/2017   Procedure: CONIZATION CERVIX WITH BIOPSY - COLD KNIFE;  Surgeon: Emily Filbert, MD;  Location: [facility];  Service: Gynecology;  Laterality: N/A;  . [place]   x 1  . CHOLECYSTECTOMY     gall stones  . COLONOSCOPY WITH PROPOFOL N/A 09/30/2017   Procedure: COLONOSCOPY WITH PROPOFOL;  Surgeon: Jerene Bears, MD;  Location: [facility];  Service: Gastroenterology;  Laterality: N/A;  . DILATION AND CURETTAGE OF UTERUS N/A 09/10/2017   Procedure: DILATATION AND CURETTAGE;  Surgeon: Emily Filbert, MD;  Location: [facility];  Service: Gynecology;  Laterality: N/A;  . MULTIPLE TOOTH EXTRACTIONS     dentures upper and lowers  . POLYPECTOMY  09/30/2017   Procedure: POLYPECTOMY;  Surgeon: Jerene Bears, MD;  Location: [facility];  Service: Gastroenterology;;  . TONSILLECTOMY      FAMILY HISTORY Family History  Problem Relation Age of Onset  . Kidney disease Mother   . Heart disease Mother   . Diabetes Mother   . Seizures Father   . Heart disease Father   . Other Father     SOCIAL HISTORY Social History   Tobacco Use  . Smoking status: Former Smoker    Packs/day: 0.25    Years: 6.00    Pack years: 1.50    Types: Cigarettes  . Smokeless tobacco: Never Used  . Tobacco comment: quit 36 yrs ago  Vaping Use  . Vaping Use: Never used  Substance Use Topics  . Alcohol use: No  . Drug use: No         OPHTHALMIC EXAM:  Base Eye Exam    Visual Acuity (Snellen - Linear)      Right Left   Dist cc 20/50 20/40   Dist ph cc 20/50 +2 20/40 +2   Correction: Glasses       Tonometry (Tonopen, 8:38 AM)      Right Left   Pressure 16 17       Pupils      Dark Light Shape React APD   Right 4 3 Round Brisk None   Left 4 3 Round Brisk  None       Visual Fields (Counting fingers)      Left Right    Full Full       Extraocular Movement      Right Left    Full, Ortho Full, Ortho       Neuro/Psych    Oriented x3:  Yes   Mood/Affect: Normal       Dilation    Both eyes: 1.0% Mydriacyl, 2.5% Phenylephrine @ 8:38 AM        Slit Lamp and Fundus Exam    Slit Lamp Exam      Right Left   Lids/Lashes Normal Normal   Conjunctiva/Sclera Melanosis Melanosis   Cornea arcus, 2-3+ Punctate epithelial erosions arcus, 2+ Punctate epithelial erosions   Anterior Chamber Deep and quiet Deep and quiet   Iris Round and dilated Round and dilated   Lens 2-3+ Nuclear sclerosis with early brunescence, 2-3+ Cortical cataract 2-3+ Nuclear sclerosis with early brunescence, 2-3+ Cortical cataract   Vitreous Vitreous syneresis Vitreous syneresis       Fundus Exam      Right Left   Disc Compact, Pink and Sharp Compact, Pink and Sharp   C/D Ratio 0.1 0.1   Macula Flat, Blunted foveal reflex, Drusen, RPE mottling and clumping, scattered Microaneurysms/DBH Flat, Blunted foveal reflex, Drusen, RPE mottling and clumping, scattered IRH   Vessels attenuated, mild tortuousity attenuated, mild tortuousity   Periphery Attached, scattered IRH/DBH, mild peripheral drusen Attached, mild peripheral drusen           Refraction    Wearing Rx      Sphere Cylinder Axis Add   Right -8.00 +4.00 116 +2.50   Left -7.50 +2.75 049 +2.50   Age: 72yr   Type: PAL       Manifest Refraction      Sphere Cylinder Axis Dist VA   Right -8.25 +3.50 115 20/40-2   Left -7.75 +2.25 040 20/30-2          IMAGING AND PROCEDURES  Imaging and Procedures for 02/08/2020  OCT, Retina - OU - Both Eyes       Right Eye Quality was borderline. Central Foveal Thickness: 318. Progression has no prior data. Findings include abnormal foveal contour, intraretinal fluid, no SRF, vitreous traction, retinal drusen  (Non-exu ARMD, VMT with macular cyst).   Left  Eye Quality was borderline. Central Foveal Thickness: 288. Progression has no prior data. Findings include normal foveal contour, no IRF, no SRF, retinal drusen , vitreomacular adhesion .   Notes *Images captured and stored on drive  Diagnosis / Impression:  Non-exu ARMD OU OD: VMT with macular cyst vs tr DME OS: VMA, no DME  Clinical management:  See below  Abbreviations: NFP - Normal foveal profile. CME - cystoid macular edema. PED - pigment epithelial detachment. IRF - intraretinal fluid. SRF - subretinal fluid. EZ - ellipsoid zone. ERM - epiretinal membrane. ORA - outer retinal atrophy. ORT - outer retinal tubulation. SRHM - subretinal hyper-reflective material. IRHM - intraretinal hyper-reflective material        Fluorescein Angiography Optos (Transit OD)       Right Eye   Progression has no prior data. Early phase findings include microaneurysm. Mid/Late phase findings include microaneurysm, leakage (Scattered patches of leaking MA, no NV, no CNV).   Left Eye   Progression has no prior data. Early phase findings include microaneurysm. Mid/Late phase findings include microaneurysm, leakage (No CNV).   Notes **Images stored on drive**  Impression: Moderate NPDR OU No active CNV OU No NV OU                  ASSESSMENT/PLAN:    ICD-10-CM   1. Intermediate stage nonexudative age-related macular degeneration of both eyes  H35.3132   2. Vitreomacular adhesion of both eyes  [id number]  3. Retinal edema  H35.81 OCT, Retina - OU - Both Eyes  4. Moderate nonproliferative diabetic retinopathy of both eyes without macular edema associated with type 2 diabetes mellitus ([place])  H88.5027   5. Essential hypertension  I10   6. Hypertensive retinopathy of both eyes  H35.033 Fluorescein Angiography Optos (Transit OD)  7. Combined forms of age-related cataract of both eyes  H25.813    1,2. Age related macular degeneration, non-exudative, both eyes  - intermediate  stage  - FA 11.8.21 - no CNV OU  - The incidence, anatomy, and pathology of dry AMD, risk of progression, and the AREDS and AREDS 2 study including smoking risks discussed with patient.  - Recommend amsler grid monitoring  3. VMT w/ macular cyst OD, VMA OS  4. Moderate nonproliferative diabetic retinopathy w/o DME, OU - The incidence, risk factors for progression, natural history and treatment options for diabetic retinopathy were discussed with patient.   - The need for close monitoring of blood glucose, blood pressure, and serum lipids, avoiding cigarette or any type of tobacco, and the need for long term follow up was also discussed with patient. - exam shows scattered MA OU - OCT without diabetic macular edema, OU (OD w/ mac cyst likely more from VMT) - FA 11.8.21 shows scattered late leaking MA OU, no NV OU - f/u in 2-3 mos -- DFE/OCT  5,6. Hypertensive retinopathy OU - discussed importance of tight BP control - monitor  7. Mixed Cataract OU - The symptoms of cataract, surgical options, and treatments and risks were discussed with patient. - discussed diagnosis and progression - approaching visual significance  Ophthalmic Meds Ordered this visit:  No orders of the defined types were placed in this encounter.      Return for f/u 2-3 months, non-exu ARMD/NPDR OU, DFE, OCT.  There are no Patient Instructions on file for this visit.  This document serves as a record of services personally performed by Gardiner Sleeper, MD, PhD. It was created on their behalf by Leeann Must, [place], an ophthalmic technician. The creation of this record is the provider's dictation and/or activities during the visit.    Electronically signed by: Leeann Must, COA 11.04.2021 12:53 PM   This document serves as a record of services personally performed by Gardiner Sleeper, MD, PhD. It was created on their behalf by San Jetty. Owens Shark, OA an ophthalmic technician. The creation of this record is the  provider's dictation and/or activities during the visit.    Electronically signed by: San Jetty. Marguerita Merles 11.08.2021 12:53 PM  Gardiner Sleeper, M.D., Ph.D. Diseases & Surgery of the Retina and [place] 02/08/2020   I have reviewed the above documentation for accuracy and completeness, and I agree with the above. Gardiner Sleeper, M.D., Ph.D. 02/08/20 12:53 PM    Abbreviations: M myopia (nearsighted); A astigmatism; H hyperopia (farsighted); P presbyopia; Mrx spectacle prescription;  CTL contact lenses; OD right eye; OS left eye; OU both eyes  XT exotropia; ET esotropia; PEK punctate epithelial keratitis; PEE punctate epithelial erosions; DES dry eye syndrome; MGD meibomian gland dysfunction; ATs artificial tears; PFAT's preservative free artificial tears; [place] nuclear sclerotic cataract; PSC posterior subcapsular cataract; ERM epi-retinal membrane; PVD posterior vitreous detachment; RD retinal detachment; DM diabetes mellitus; DR diabetic retinopathy; NPDR non-proliferative diabetic retinopathy; PDR proliferative diabetic retinopathy; CSME clinically significant macular edema; DME diabetic macular edema; dbh dot blot hemorrhages; CWS cotton wool spot; POAG primary open angle glaucoma; C/D  cup-to-disc ratio; HVF humphrey visual field; GVF goldmann visual field; OCT optical coherence tomography; IOP intraocular pressure; BRVO [place] retinal vein occlusion; CRVO central retinal vein occlusion; CRAO central retinal artery occlusion; BRAO [place] retinal artery occlusion; RT retinal tear; SB scleral buckle; PPV pars plana vitrectomy; VH Vitreous hemorrhage; PRP panretinal laser photocoagulation; IVK intravitreal kenalog; VMT vitreomacular traction; MH Macular hole;  NVD neovascularization of the disc; NVE neovascularization elsewhere; AREDS age related eye disease study; ARMD age related macular degeneration; POAG primary open angle glaucoma; EBMD epithelial/anterior basement  membrane dystrophy; ACIOL anterior chamber intraocular lens; IOL intraocular lens; PCIOL posterior chamber intraocular lens; Phaco/IOL phacoemulsification with intraocular lens placement; [place] photorefractive keratectomy; LASIK laser assisted in situ keratomileusis; HTN hypertension; DM diabetes mellitus; COPD chronic obstructive pulmonary disease

## 2020-02-08 ENCOUNTER — Ambulatory Visit (INDEPENDENT_AMBULATORY_CARE_PROVIDER_SITE_OTHER): Payer: Medicare HMO | Admitting: Ophthalmology

## 2020-02-08 ENCOUNTER — Encounter (INDEPENDENT_AMBULATORY_CARE_PROVIDER_SITE_OTHER): Payer: Self-pay | Admitting: Ophthalmology

## 2020-02-08 ENCOUNTER — Other Ambulatory Visit: Payer: Self-pay

## 2020-02-08 DIAGNOSIS — H35033 Hypertensive retinopathy, bilateral: Secondary | ICD-10-CM

## 2020-02-08 DIAGNOSIS — H353132 Nonexudative age-related macular degeneration, bilateral, intermediate dry stage: Secondary | ICD-10-CM

## 2020-02-08 DIAGNOSIS — H43823 Vitreomacular adhesion, bilateral: Secondary | ICD-10-CM

## 2020-02-08 DIAGNOSIS — E113393 Type 2 diabetes mellitus with moderate nonproliferative diabetic retinopathy without macular edema, bilateral: Secondary | ICD-10-CM

## 2020-02-08 DIAGNOSIS — H25813 Combined forms of age-related cataract, bilateral: Secondary | ICD-10-CM | POA: Diagnosis not present

## 2020-02-08 DIAGNOSIS — I10 Essential (primary) hypertension: Secondary | ICD-10-CM | POA: Diagnosis not present

## 2020-02-08 DIAGNOSIS — H3581 Retinal edema: Secondary | ICD-10-CM | POA: Diagnosis not present

## 2020-02-08 LAB — HM DIABETES EYE EXAM

## 2020-04-22 ENCOUNTER — Encounter: Payer: Self-pay | Admitting: Dietician

## 2020-05-05 NOTE — Progress Notes (Shared)
[facility] Note  05/10/2020     CHIEF COMPLAINT Patient presents for No chief complaint on file.   HISTORY OF PRESENT ILLNESS: Tina Lara is a 65 y.o. female who presents to the clinic today for:     Referring physician: Velna Ochs, MD [place],  [place] [address]  HISTORICAL INFORMATION:   Selected notes from the MEDICAL RECORD NUMBER Referred by Dr. Frederico Hamman for retina eval  LEE: 01/08/2020 Ocular Hx-BDR, NS OU PMH-DM   CURRENT MEDICATIONS: Current Outpatient Medications (Ophthalmic Drugs)  Medication Sig  . Hypromellose (ARTIFICIAL TEARS OP) Place 1 drop into both eyes daily as needed (for dry eyes).   No current facility-administered medications for this visit. (Ophthalmic Drugs)   Current Outpatient Medications (Other)  Medication Sig  . acetaminophen (TYLENOL) 500 MG tablet Take 1,500 mg by mouth every 6 (six) hours as needed for moderate pain or headache.   Marland Kitchen amLODipine (NORVASC) 5 MG tablet Take 1 tablet (5 mg total) by mouth daily.  Marland Kitchen aspirin 81 MG EC tablet Take 1 tablet (81 mg total) by mouth daily. (Patient taking differently: Take 81 mg by mouth every other day. )  . atorvastatin (LIPITOR) 40 MG tablet Take 1 tablet (40 mg total) by mouth daily.  Marland Kitchen ibuprofen (ADVIL,MOTRIN) 200 MG tablet Take 400 mg by mouth every 6 (six) hours as needed for headache or moderate pain.   Marland Kitchen lisinopril (ZESTRIL) 10 MG tablet Take 1 tablet (10 mg total) by mouth daily.  . Menthol, Topical Analgesic, (BIOFREEZE EX) Apply 1 application topically daily as needed (back pain).  . metFORMIN (GLUCOPHAGE-XR) 500 MG 24 hr tablet Take 1 tablet (500 mg total) by mouth daily with breakfast.  . pantoprazole (PROTONIX) 40 MG tablet Take 1 tablet (40 mg total) by mouth daily.   No current facility-administered medications for this visit. (Other)      REVIEW OF SYSTEMS:    ALLERGIES Allergies  Allergen Reactions  . Percocet  [Oxycodone-Acetaminophen] Itching    PAST MEDICAL HISTORY Past Medical History:  Diagnosis Date  . Anxiety   . Cataract    NS OD  . Chest pain 08/14/2016  . Complication of anesthesia    Per patient "needs a lot of anesthesia w/surgery" ,I had toruble with a spinal block not taking "  . Diabetes mellitus without complication (HCC)    Type 2  . Diabetic retinopathy ([place])    BDR  . Full dentures   . GERD (gastroesophageal reflux disease)   . History of surgery on arm    right arm broken  . Hyperlipidemia   . Hypertension    never diagnosed by physician per pt  . Macular degeneration   . Obesity    Past Surgical History:  Procedure Laterality Date  . BIOPSY  09/30/2017   Procedure: BIOPSY;  Surgeon: Jerene Bears, MD;  Location: [facility];  Service: Gastroenterology;;  . CERVICAL CONIZATION W/BX N/A 09/10/2017   Procedure: CONIZATION CERVIX WITH BIOPSY - COLD KNIFE;  Surgeon: Emily Filbert, MD;  Location: [facility];  Service: Gynecology;  Laterality: N/A;  . [place]   x 1  . CHOLECYSTECTOMY     gall stones  . COLONOSCOPY WITH PROPOFOL N/A 09/30/2017   Procedure: COLONOSCOPY WITH PROPOFOL;  Surgeon: Jerene Bears, MD;  Location: [facility];  Service: Gastroenterology;  Laterality: N/A;  . DILATION AND CURETTAGE OF UTERUS N/A 09/10/2017   Procedure: DILATATION AND  CURETTAGE;  Surgeon: Emily Filbert, MD;  Location: [facility];  Service: Gynecology;  Laterality: N/A;  . MULTIPLE TOOTH EXTRACTIONS     dentures upper and lowers  . POLYPECTOMY  09/30/2017   Procedure: POLYPECTOMY;  Surgeon: Jerene Bears, MD;  Location: [facility];  Service: Gastroenterology;;  . TONSILLECTOMY      FAMILY HISTORY Family History  Problem Relation Age of Onset  . Kidney disease Mother   . Heart disease Mother   . Diabetes Mother   . Seizures Father   . Heart disease Father   . Other Father     SOCIAL HISTORY Social History   Tobacco Use  . Smoking status: Former Smoker     Packs/day: 0.25    Years: 6.00    Pack years: 1.50    Types: Cigarettes  . Smokeless tobacco: Never Used  . Tobacco comment: quit 36 yrs ago  Vaping Use  . Vaping Use: Never used  Substance Use Topics  . Alcohol use: No  . Drug use: No         OPHTHALMIC EXAM:  Not recorded     IMAGING AND PROCEDURES  Imaging and Procedures for 05/10/2020           ASSESSMENT/PLAN:  No diagnosis found. 1,2. Age related macular degeneration, non-exudative, both eyes  - intermediate stage  - FA 11.8.21 - no CNV OU  - The incidence, anatomy, and pathology of dry AMD, risk of progression, and the AREDS and AREDS 2 study including smoking risks discussed with patient.  - Recommend amsler grid monitoring  3. VMT w/ macular cyst OD, VMA OS  4. Moderate nonproliferative diabetic retinopathy w/o DME, OU - The incidence, risk factors for progression, natural history and treatment options for diabetic retinopathy were discussed with patient.   - The need for close monitoring of blood glucose, blood pressure, and serum lipids, avoiding cigarette or any type of tobacco, and the need for long term follow up was also discussed with patient. - exam shows scattered MA OU - OCT without diabetic macular edema, OU (OD w/ mac cyst likely more from VMT) - FA 11.8.21 shows scattered late leaking MA OU, no NV OU - f/u in 2-3 mos -- DFE/OCT  5,6. Hypertensive retinopathy OU - discussed importance of tight BP control - monitor  7. Mixed Cataract OU - The symptoms of cataract, surgical options, and treatments and risks were discussed with patient. - discussed diagnosis and progression - approaching visual significance  Ophthalmic Meds Ordered this visit:  No orders of the defined types were placed in this encounter.      No follow-ups on file.  There are no Patient Instructions on file for this visit.  This document serves as a record of services personally performed by Gardiner Sleeper, MD,  PhD. It was created on their behalf by Estill Bakes, COT an ophthalmic technician. The creation of this record is the provider's dictation and/or activities during the visit.    Electronically signed by: Estill Bakes, COT 2.3.22 @ 10:01 AM   Abbreviations: M myopia (nearsighted); A astigmatism; H hyperopia (farsighted); P presbyopia; Mrx spectacle prescription;  CTL contact lenses; OD right eye; OS left eye; OU both eyes  XT exotropia; ET esotropia; PEK punctate epithelial keratitis; PEE punctate epithelial erosions; DES dry eye syndrome; MGD meibomian gland dysfunction; ATs artificial tears; PFAT's preservative free artificial tears; [place] nuclear sclerotic cataract; PSC posterior subcapsular cataract; ERM epi-retinal membrane; PVD posterior vitreous detachment; RD retinal detachment;  DM diabetes mellitus; DR diabetic retinopathy; NPDR non-proliferative diabetic retinopathy; PDR proliferative diabetic retinopathy; CSME clinically significant macular edema; DME diabetic macular edema; dbh dot blot hemorrhages; CWS cotton wool spot; POAG primary open angle glaucoma; C/D cup-to-disc ratio; HVF humphrey visual field; GVF goldmann visual field; OCT optical coherence tomography; IOP intraocular pressure; BRVO [place] retinal vein occlusion; CRVO central retinal vein occlusion; CRAO central retinal artery occlusion; BRAO [place] retinal artery occlusion; RT retinal tear; SB scleral buckle; PPV pars plana vitrectomy; VH Vitreous hemorrhage; PRP panretinal laser photocoagulation; IVK intravitreal kenalog; VMT vitreomacular traction; MH Macular hole;  NVD neovascularization of the disc; NVE neovascularization elsewhere; AREDS age related eye disease study; ARMD age related macular degeneration; POAG primary open angle glaucoma; EBMD epithelial/anterior basement membrane dystrophy; ACIOL anterior chamber intraocular lens; IOL intraocular lens; PCIOL posterior chamber intraocular lens; Phaco/IOL phacoemulsification with  intraocular lens placement; [place] photorefractive keratectomy; LASIK laser assisted in situ keratomileusis; HTN hypertension; DM diabetes mellitus; COPD chronic obstructive pulmonary disease

## 2020-05-10 ENCOUNTER — Encounter (INDEPENDENT_AMBULATORY_CARE_PROVIDER_SITE_OTHER): Payer: Medicare HMO | Admitting: Ophthalmology

## 2020-05-10 DIAGNOSIS — H43823 Vitreomacular adhesion, bilateral: Secondary | ICD-10-CM

## 2020-05-10 DIAGNOSIS — H353132 Nonexudative age-related macular degeneration, bilateral, intermediate dry stage: Secondary | ICD-10-CM

## 2020-05-10 DIAGNOSIS — H25813 Combined forms of age-related cataract, bilateral: Secondary | ICD-10-CM

## 2020-05-10 DIAGNOSIS — E113393 Type 2 diabetes mellitus with moderate nonproliferative diabetic retinopathy without macular edema, bilateral: Secondary | ICD-10-CM

## 2020-05-10 DIAGNOSIS — H35033 Hypertensive retinopathy, bilateral: Secondary | ICD-10-CM

## 2020-05-10 DIAGNOSIS — H3581 Retinal edema: Secondary | ICD-10-CM

## 2020-05-10 DIAGNOSIS — I10 Essential (primary) hypertension: Secondary | ICD-10-CM

## 2020-05-18 NOTE — Progress Notes (Signed)
[facility] Note  05/20/2020     CHIEF COMPLAINT Patient presents for Retina Follow Up   HISTORY OF PRESENT ILLNESS: Tina Lara is a 65 y.o. female who presents to the clinic today for:   HPI    Retina Follow Up    Patient presents with  Diabetic Retinopathy.  In both eyes.  Duration of 3 months.  Since onset it is stable.  I, the attending physician,  performed the HPI with the patient and updated documentation appropriately.          Comments    14 1/2 week follow up NPDR OU- Vision stable, no new problems with eyes.   BS hasn't checked A1C 8.6 a year ago, she thinks she has had an appt 05/2020.   Pt states she has been having SOB.  She is going to talk to PCP at her appt in March.        Last edited by Bernarda Caffey, MD on 05/20/2020 12:53 PM. (History)    pt states no change in vision, but says her BP has been high lately   Referring physician: Gevena Cotton, MD [place],  [place] [address]  HISTORICAL INFORMATION:   Selected notes from the MEDICAL RECORD NUMBER Referred by Dr. Frederico Hamman for retina eval  LEE: 01/08/2020 Ocular Hx-BDR, NS OU PMH-DM    CURRENT MEDICATIONS: Current Outpatient Medications (Ophthalmic Drugs)  Medication Sig  . Hypromellose (ARTIFICIAL TEARS OP) Place 1 drop into both eyes daily as needed (for dry eyes).   No current facility-administered medications for this visit. (Ophthalmic Drugs)   Current Outpatient Medications (Other)  Medication Sig  . acetaminophen (TYLENOL) 500 MG tablet Take 1,500 mg by mouth every 6 (six) hours as needed for moderate pain or headache.   Marland Kitchen amLODipine (NORVASC) 5 MG tablet Take 1 tablet (5 mg total) by mouth daily.  Marland Kitchen aspirin 81 MG EC tablet Take 1 tablet (81 mg total) by mouth daily. (Patient taking differently: Take 81 mg by mouth every other day.)  . ibuprofen (ADVIL,MOTRIN) 200 MG tablet Take 400 mg by mouth every 6 (six) hours as needed  for headache or moderate pain.   Marland Kitchen lisinopril (ZESTRIL) 10 MG tablet Take 1 tablet (10 mg total) by mouth daily.  . Menthol, Topical Analgesic, (BIOFREEZE EX) Apply 1 application topically daily as needed (back pain).  . metFORMIN (GLUCOPHAGE-XR) 500 MG 24 hr tablet Take 1 tablet (500 mg total) by mouth daily with breakfast.  . pantoprazole (PROTONIX) 40 MG tablet Take 1 tablet (40 mg total) by mouth daily.  Marland Kitchen atorvastatin (LIPITOR) 40 MG tablet Take 1 tablet (40 mg total) by mouth daily.   No current facility-administered medications for this visit. (Other)      REVIEW OF SYSTEMS: ROS    Positive for: Gastrointestinal, Endocrine, Eyes   Negative for: Constitutional, Neurological, Skin, Genitourinary, Musculoskeletal, HENT, Cardiovascular, Respiratory, Psychiatric, Allergic/Imm, Heme/Lymph   Last edited by Leonie Douglas, COA on 05/20/2020 10:05 AM. (History)       ALLERGIES Allergies  Allergen Reactions  . Percocet [Oxycodone-Acetaminophen] Itching    PAST MEDICAL HISTORY Past Medical History:  Diagnosis Date  . Anxiety   . Cataract    NS OD  . Chest pain 08/14/2016  . Complication of anesthesia    Per patient "needs a lot of anesthesia w/surgery" ,I had toruble with a spinal block not taking "  . Diabetes mellitus without complication ([place])  Type 2  . Diabetic retinopathy ([place])    BDR  . Full dentures   . GERD (gastroesophageal reflux disease)   . History of surgery on arm    right arm broken  . Hyperlipidemia   . Hypertension    never diagnosed by physician per pt  . Macular degeneration   . Obesity    Past Surgical History:  Procedure Laterality Date  . BIOPSY  09/30/2017   Procedure: BIOPSY;  Surgeon: Jerene Bears, MD;  Location: [facility];  Service: Gastroenterology;;  . CERVICAL CONIZATION W/BX N/A 09/10/2017   Procedure: CONIZATION CERVIX WITH BIOPSY - COLD KNIFE;  Surgeon: Emily Filbert, MD;  Location: [facility];  Service: Gynecology;  Laterality: N/A;  .  [place]   x 1  . CHOLECYSTECTOMY     gall stones  . COLONOSCOPY WITH PROPOFOL N/A 09/30/2017   Procedure: COLONOSCOPY WITH PROPOFOL;  Surgeon: Jerene Bears, MD;  Location: [facility];  Service: Gastroenterology;  Laterality: N/A;  . DILATION AND CURETTAGE OF UTERUS N/A 09/10/2017   Procedure: DILATATION AND CURETTAGE;  Surgeon: Emily Filbert, MD;  Location: [facility];  Service: Gynecology;  Laterality: N/A;  . MULTIPLE TOOTH EXTRACTIONS     dentures upper and lowers  . POLYPECTOMY  09/30/2017   Procedure: POLYPECTOMY;  Surgeon: Jerene Bears, MD;  Location: [facility];  Service: Gastroenterology;;  . TONSILLECTOMY      FAMILY HISTORY Family History  Problem Relation Age of Onset  . Kidney disease Mother   . Heart disease Mother   . Diabetes Mother   . Seizures Father   . Heart disease Father   . Other Father     SOCIAL HISTORY Social History   Tobacco Use  . Smoking status: Former Smoker    Packs/day: 0.25    Years: 6.00    Pack years: 1.50    Types: Cigarettes  . Smokeless tobacco: Never Used  . Tobacco comment: quit 36 yrs ago  Vaping Use  . Vaping Use: Never used  Substance Use Topics  . Alcohol use: No  . Drug use: No         OPHTHALMIC EXAM:  Base Eye Exam    Visual Acuity (Snellen - Linear)      Right Left   Dist cc 20/70 +2 20/50   Dist ph cc 20/60 +1 20/40       Tonometry (Tonopen, 10:24 AM)      Right Left   Pressure 15 14       Pupils      Dark Light Shape React APD   Right 4 3 Round Brisk None   Left 4 3 Round Slow None       Visual Fields (Counting fingers)      Left Right    Full Full       Extraocular Movement      Right Left    Full Full       Neuro/Psych    Oriented x3: Yes   Mood/Affect: Normal       Dilation    Both eyes: 1.0% Mydriacyl, 2.5% Phenylephrine @ 10:24 AM        Slit Lamp and Fundus Exam    Slit Lamp Exam      Right Left   Lids/Lashes Normal Normal   Conjunctiva/Sclera Melanosis  Melanosis   Cornea arcus, 1+Punctate epithelial erosions arcus, 1+Punctate epithelial erosions   Anterior Chamber Deep and quiet Deep and quiet  Iris Round and dilated Round and dilated   Lens 2-3+ Nuclear sclerosis with early brunescence, 2-3+ Cortical cataract 2-3+ Nuclear sclerosis with early brunescence, 2-3+ Cortical cataract   Vitreous Vitreous syneresis Vitreous syneresis       Fundus Exam      Right Left   Disc Compact, Pink and Sharp Compact, Pink and Sharp   C/D Ratio 0.1 0.1   Macula Flat, Blunted foveal reflex, Drusen, RPE mottling and clumping, scattered Microaneurysms/DBH, interval increase in central cystic changes Flat, Blunted foveal reflex, Drusen, RPE mottling and clumping, scattered MA / IRH   Vessels attenuated, mild Copper wiring, Tortuous attenuated, mild tortuousity   Periphery Attached, scattered IRH/DBH, mild peripheral drusen Attached, mild peripheral drusen           Refraction    Wearing Rx      Sphere Cylinder Axis Add   Right -8.00 +4.00 116 +2.50   Left -7.50 +2.75 049 +2.50   Type: PAL       Manifest Refraction      Sphere Cylinder Axis Dist VA   Right -7.50 +2.25 115 20/60   Left -7.25 +1.75 050 NI          IMAGING AND PROCEDURES  Imaging and Procedures for 05/20/2020  OCT, Retina - OU - Both Eyes       Right Eye Quality was borderline. Central Foveal Thickness: 327. Progression has worsened. Findings include abnormal foveal contour, intraretinal fluid, no SRF, vitreous traction, retinal drusen  (Mild interval increase in cystic changes centrally).   Left Eye Quality was borderline. Central Foveal Thickness: 288. Progression has been stable. Findings include no IRF, no SRF, retinal drusen , abnormal foveal contour, vitreous traction (Interval development of mild, central VMT).   Notes *Images captured and stored on drive  Diagnosis / Impression:  Non-exu ARMD OU OD: Mild interval increase in cystic changes centrally -- +DME OS:  Interval development of mild, central VMT  Clinical management:  See below  Abbreviations: NFP - Normal foveal profile. CME - cystoid macular edema. PED - pigment epithelial detachment. IRF - intraretinal fluid. SRF - subretinal fluid. EZ - ellipsoid zone. ERM - epiretinal membrane. ORA - outer retinal atrophy. ORT - outer retinal tubulation. SRHM - subretinal hyper-reflective material. IRHM - intraretinal hyper-reflective material                 ASSESSMENT/PLAN:    ICD-10-CM   1. Moderate nonproliferative diabetic retinopathy of right eye with macular edema associated with type 2 diabetes mellitus ([place])  N56.2130   2. Moderate nonproliferative diabetic retinopathy of left eye without macular edema associated with type 2 diabetes mellitus ([place])  Q65.7846   3. Retinal edema  H35.81 OCT, Retina - OU - Both Eyes  4. Intermediate stage nonexudative age-related macular degeneration of both eyes  H35.3132   5. Vitreomacular adhesion of both eyes  H43.823   6. Essential hypertension  I10   7. Hypertensive retinopathy of both eyes  H35.033   8. Combined forms of age-related cataract of both eyes  H25.813     1-3. Moderate nonproliferative diabetic retinopathy with DME, OD  - OS without DME - exam shows scattered MA OU  - FA 11.08.21 shows scattered late leaking MA OU, no NV OU  - OCT shows OD: Mild interval increase in cystic changes centrally; OS: Interval development of mild, central VMT  - BCVA OD 20/60 from 20/50; OS stable 20/40 - recommend IVA OD #1 today, 02.18.22 - pt unable  to receive injection today due to insurance auth requirement - f/u week of February 28 -- DFE/OCT, IVA OD  4. Age related macular degeneration, non-exudative, both eyes  - intermediate stage  - FA 11.8.21 - no CNV OU  - Recommend AREDS 2 supplements and amsler grid monitoring  5. VMT OU w/ macular cyst OD  6,7. Hypertensive retinopathy OU - discussed importance of tight BP control - monitor  8.  Mixed Cataract OU - The symptoms of cataract, surgical options, and treatments and risks were discussed with patient. - discussed diagnosis and progression - approaching visual significance   Ophthalmic Meds Ordered this visit:  No orders of the defined types were placed in this encounter.      Return for f/u week of February 28 or later.  There are no Patient Instructions on file for this visit.  This document serves as a record of services personally performed by Gardiner Sleeper, MD, PhD. It was created on their behalf by San Jetty. Owens Shark, OA an ophthalmic technician. The creation of this record is the provider's dictation and/or activities during the visit.    Electronically signed by: San Jetty. Owens Shark, [place] 02.16.2022 1:01 PM  Gardiner Sleeper, M.D., Ph.D. Diseases & Surgery of the Retina and Vitreous [facility]  I have reviewed the above documentation for accuracy and completeness, and I agree with the above. Gardiner Sleeper, M.D., Ph.D. 05/20/20 1:01 PM  Abbreviations: M myopia (nearsighted); A astigmatism; H hyperopia (farsighted); P presbyopia; Mrx spectacle prescription;  CTL contact lenses; OD right eye; OS left eye; OU both eyes  XT exotropia; ET esotropia; PEK punctate epithelial keratitis; PEE punctate epithelial erosions; DES dry eye syndrome; MGD meibomian gland dysfunction; ATs artificial tears; PFAT's preservative free artificial tears; [place] nuclear sclerotic cataract; PSC posterior subcapsular cataract; ERM epi-retinal membrane; PVD posterior vitreous detachment; RD retinal detachment; DM diabetes mellitus; DR diabetic retinopathy; NPDR non-proliferative diabetic retinopathy; PDR proliferative diabetic retinopathy; CSME clinically significant macular edema; DME diabetic macular edema; dbh dot blot hemorrhages; CWS cotton wool spot; POAG primary open angle glaucoma; C/D cup-to-disc ratio; HVF humphrey visual field; GVF goldmann visual field; OCT optical  coherence tomography; IOP intraocular pressure; BRVO [place] retinal vein occlusion; CRVO central retinal vein occlusion; CRAO central retinal artery occlusion; BRAO [place] retinal artery occlusion; RT retinal tear; SB scleral buckle; PPV pars plana vitrectomy; VH Vitreous hemorrhage; PRP panretinal laser photocoagulation; IVK intravitreal kenalog; VMT vitreomacular traction; MH Macular hole;  NVD neovascularization of the disc; NVE neovascularization elsewhere; AREDS age related eye disease study; ARMD age related macular degeneration; POAG primary open angle glaucoma; EBMD epithelial/anterior basement membrane dystrophy; ACIOL anterior chamber intraocular lens; IOL intraocular lens; PCIOL posterior chamber intraocular lens; Phaco/IOL phacoemulsification with intraocular lens placement; [place] photorefractive keratectomy; LASIK laser assisted in situ keratomileusis; HTN hypertension; DM diabetes mellitus; COPD chronic obstructive pulmonary disease

## 2020-05-20 ENCOUNTER — Ambulatory Visit (INDEPENDENT_AMBULATORY_CARE_PROVIDER_SITE_OTHER): Payer: Medicare HMO | Admitting: Ophthalmology

## 2020-05-20 ENCOUNTER — Other Ambulatory Visit: Payer: Self-pay

## 2020-05-20 ENCOUNTER — Encounter (INDEPENDENT_AMBULATORY_CARE_PROVIDER_SITE_OTHER): Payer: Self-pay | Admitting: Ophthalmology

## 2020-05-20 DIAGNOSIS — H353132 Nonexudative age-related macular degeneration, bilateral, intermediate dry stage: Secondary | ICD-10-CM | POA: Diagnosis not present

## 2020-05-20 DIAGNOSIS — E113311 Type 2 diabetes mellitus with moderate nonproliferative diabetic retinopathy with macular edema, right eye: Secondary | ICD-10-CM | POA: Diagnosis not present

## 2020-05-20 DIAGNOSIS — E113393 Type 2 diabetes mellitus with moderate nonproliferative diabetic retinopathy without macular edema, bilateral: Secondary | ICD-10-CM

## 2020-05-20 DIAGNOSIS — E113392 Type 2 diabetes mellitus with moderate nonproliferative diabetic retinopathy without macular edema, left eye: Secondary | ICD-10-CM

## 2020-05-20 DIAGNOSIS — H3581 Retinal edema: Secondary | ICD-10-CM

## 2020-05-20 DIAGNOSIS — H35033 Hypertensive retinopathy, bilateral: Secondary | ICD-10-CM

## 2020-05-20 DIAGNOSIS — H43823 Vitreomacular adhesion, bilateral: Secondary | ICD-10-CM

## 2020-05-20 DIAGNOSIS — I10 Essential (primary) hypertension: Secondary | ICD-10-CM

## 2020-05-20 DIAGNOSIS — H25813 Combined forms of age-related cataract, bilateral: Secondary | ICD-10-CM

## 2020-05-20 LAB — HM DIABETES EYE EXAM

## 2020-05-23 ENCOUNTER — Encounter (INDEPENDENT_AMBULATORY_CARE_PROVIDER_SITE_OTHER): Payer: Medicare HMO | Admitting: Ophthalmology

## 2020-05-26 NOTE — Progress Notes (Shared)
[facility] Note  05/30/2020     CHIEF COMPLAINT Patient presents for No chief complaint on file.   HISTORY OF PRESENT ILLNESS: Tina Lara is a 65 y.o. female who presents to the clinic today for:   pt states no change in vision, but says her BP has been high lately   Referring physician: Velna Ochs, MD [place],  [place] [address]  HISTORICAL INFORMATION:   Selected notes from the [facility] Referred by Dr. Frederico Hamman for retina eval  LEE: 01/08/2020 Ocular Hx-BDR, NS OU PMH-DM    CURRENT MEDICATIONS: Current Outpatient Medications (Ophthalmic Drugs)  Medication Sig  . Hypromellose (ARTIFICIAL TEARS OP) Place 1 drop into both eyes daily as needed (for dry eyes).   No current facility-administered medications for this visit. (Ophthalmic Drugs)   Current Outpatient Medications (Other)  Medication Sig  . acetaminophen (TYLENOL) 500 MG tablet Take 1,500 mg by mouth every 6 (six) hours as needed for moderate pain or headache.   Marland Kitchen amLODipine (NORVASC) 5 MG tablet Take 1 tablet (5 mg total) by mouth daily.  Marland Kitchen aspirin 81 MG EC tablet Take 1 tablet (81 mg total) by mouth daily. (Patient taking differently: Take 81 mg by mouth every other day.)  . atorvastatin (LIPITOR) 40 MG tablet Take 1 tablet (40 mg total) by mouth daily.  Marland Kitchen ibuprofen (ADVIL,MOTRIN) 200 MG tablet Take 400 mg by mouth every 6 (six) hours as needed for headache or moderate pain.   Marland Kitchen lisinopril (ZESTRIL) 10 MG tablet Take 1 tablet (10 mg total) by mouth daily.  . Menthol, Topical Analgesic, (BIOFREEZE EX) Apply 1 application topically daily as needed (back pain).  . metFORMIN (GLUCOPHAGE-XR) 500 MG 24 hr tablet Take 1 tablet (500 mg total) by mouth daily with breakfast.  . pantoprazole (PROTONIX) 40 MG tablet Take 1 tablet (40 mg total) by mouth daily.   No current facility-administered medications for this visit. (Other)      REVIEW OF  SYSTEMS:    ALLERGIES Allergies  Allergen Reactions  . Percocet [Oxycodone-Acetaminophen] Itching    PAST MEDICAL HISTORY Past Medical History:  Diagnosis Date  . Anxiety   . Cataract    NS OD  . Chest pain 08/14/2016  . Complication of anesthesia    Per patient "needs a lot of anesthesia w/surgery" ,I had toruble with a spinal block not taking "  . Diabetes mellitus without complication (HCC)    Type 2  . Diabetic retinopathy ([place])    BDR  . Full dentures   . GERD (gastroesophageal reflux disease)   . History of surgery on arm    right arm broken  . Hyperlipidemia   . Hypertension    never diagnosed by physician per pt  . Macular degeneration   . Obesity    Past Surgical History:  Procedure Laterality Date  . BIOPSY  09/30/2017   Procedure: BIOPSY;  Surgeon: Jerene Bears, MD;  Location: [facility];  Service: Gastroenterology;;  . CERVICAL CONIZATION W/BX N/A 09/10/2017   Procedure: CONIZATION CERVIX WITH BIOPSY - COLD KNIFE;  Surgeon: Emily Filbert, MD;  Location: [facility];  Service: Gynecology;  Laterality: N/A;  . [place]   x 1  . CHOLECYSTECTOMY     gall stones  . COLONOSCOPY WITH PROPOFOL N/A 09/30/2017   Procedure: COLONOSCOPY WITH PROPOFOL;  Surgeon: Jerene Bears, MD;  Location: [facility];  Service: Gastroenterology;  Laterality: N/A;  .  DILATION AND CURETTAGE OF UTERUS N/A 09/10/2017   Procedure: DILATATION AND CURETTAGE;  Surgeon: Emily Filbert, MD;  Location: [facility];  Service: Gynecology;  Laterality: N/A;  . MULTIPLE TOOTH EXTRACTIONS     dentures upper and lowers  . POLYPECTOMY  09/30/2017   Procedure: POLYPECTOMY;  Surgeon: Jerene Bears, MD;  Location: [facility];  Service: Gastroenterology;;  . TONSILLECTOMY      FAMILY HISTORY Family History  Problem Relation Age of Onset  . Kidney disease Mother   . Heart disease Mother   . Diabetes Mother   . Seizures Father   . Heart disease Father   . Other Father     SOCIAL  HISTORY Social History   Tobacco Use  . Smoking status: Former Smoker    Packs/day: 0.25    Years: 6.00    Pack years: 1.50    Types: Cigarettes  . Smokeless tobacco: Never Used  . Tobacco comment: quit 36 yrs ago  Vaping Use  . Vaping Use: Never used  Substance Use Topics  . Alcohol use: No  . Drug use: No         OPHTHALMIC EXAM:  Not recorded     IMAGING AND PROCEDURES  Imaging and Procedures for 05/30/2020           ASSESSMENT/PLAN:    ICD-10-CM   1. Moderate nonproliferative diabetic retinopathy of right eye with macular edema associated with type 2 diabetes mellitus ([place])  V56.4332   2. Moderate nonproliferative diabetic retinopathy of left eye without macular edema associated with type 2 diabetes mellitus ([place])  R51.8841   3. Retinal edema  H35.81   4. Intermediate stage nonexudative age-related macular degeneration of both eyes  H35.3132   5. Vitreomacular adhesion of both eyes  H43.823   6. Essential hypertension  I10   7. Hypertensive retinopathy of both eyes  H35.033   8. Combined forms of age-related cataract of both eyes  H25.813   9. Moderate nonproliferative diabetic retinopathy of both eyes without macular edema associated with type 2 diabetes mellitus ([place])  Y60.6301     1-3. Moderate nonproliferative diabetic retinopathy with DME, OD  - OS without DME - exam shows scattered MA OU  - FA 11.08.21 shows scattered late leaking MA OU, no NV OU  - OCT shows OD: Mild interval increase in cystic changes centrally; OS: Interval development of mild, central VMT  - BCVA OD 20/60 from 20/50; OS stable 20/40 - recommend IVA OD #1 today, 02.28.22 - pt unable to receive injection today due to insurance auth requirement - f/u week of February 28 -- DFE/OCT, IVA OD  4. Age related macular degeneration, non-exudative, both eyes  - intermediate stage  - FA 11.8.21 - no CNV OU  - Recommend AREDS 2 supplements and amsler grid monitoring  5. VMT OU w/  macular cyst OD  6,7. Hypertensive retinopathy OU - discussed importance of tight BP control - monitor  8. Mixed Cataract OU - The symptoms of cataract, surgical options, and treatments and risks were discussed with patient. - discussed diagnosis and progression - approaching visual significance   Ophthalmic Meds Ordered this visit:  No orders of the defined types were placed in this encounter.      No follow-ups on file.  There are no Patient Instructions on file for this visit.  This document serves as a record of services personally performed by Gardiner Sleeper, MD, PhD. It was created on their behalf by Caryl Pina  English, COA, an ophthalmic technician. The creation of this record is the provider's dictation and/or activities during the visit.    Electronically signed by: Leeann Must, COA @TODAY @ 7:45 AM  Abbreviations: M myopia (nearsighted); A astigmatism; H hyperopia (farsighted); P presbyopia; Mrx spectacle prescription;  CTL contact lenses; OD right eye; OS left eye; OU both eyes  XT exotropia; ET esotropia; PEK punctate epithelial keratitis; PEE punctate epithelial erosions; DES dry eye syndrome; MGD meibomian gland dysfunction; ATs artificial tears; PFAT's preservative free artificial tears; [place] nuclear sclerotic cataract; PSC posterior subcapsular cataract; ERM epi-retinal membrane; PVD posterior vitreous detachment; RD retinal detachment; DM diabetes mellitus; DR diabetic retinopathy; NPDR non-proliferative diabetic retinopathy; PDR proliferative diabetic retinopathy; CSME clinically significant macular edema; DME diabetic macular edema; dbh dot blot hemorrhages; CWS cotton wool spot; POAG primary open angle glaucoma; C/D cup-to-disc ratio; HVF humphrey visual field; GVF goldmann visual field; OCT optical coherence tomography; IOP intraocular pressure; BRVO [place] retinal vein occlusion; CRVO central retinal vein occlusion; CRAO central retinal artery occlusion; BRAO [place]  retinal artery occlusion; RT retinal tear; SB scleral buckle; PPV pars plana vitrectomy; VH Vitreous hemorrhage; PRP panretinal laser photocoagulation; IVK intravitreal kenalog; VMT vitreomacular traction; MH Macular hole;  NVD neovascularization of the disc; NVE neovascularization elsewhere; AREDS age related eye disease study; ARMD age related macular degeneration; POAG primary open angle glaucoma; EBMD epithelial/anterior basement membrane dystrophy; ACIOL anterior chamber intraocular lens; IOL intraocular lens; PCIOL posterior chamber intraocular lens; Phaco/IOL phacoemulsification with intraocular lens placement; [place] photorefractive keratectomy; LASIK laser assisted in situ keratomileusis; HTN hypertension; DM diabetes mellitus; COPD chronic obstructive pulmonary disease

## 2020-05-30 ENCOUNTER — Encounter (INDEPENDENT_AMBULATORY_CARE_PROVIDER_SITE_OTHER): Payer: Medicare HMO | Admitting: Ophthalmology

## 2020-05-30 DIAGNOSIS — H43823 Vitreomacular adhesion, bilateral: Secondary | ICD-10-CM

## 2020-05-30 DIAGNOSIS — H353132 Nonexudative age-related macular degeneration, bilateral, intermediate dry stage: Secondary | ICD-10-CM

## 2020-05-30 DIAGNOSIS — H3581 Retinal edema: Secondary | ICD-10-CM

## 2020-05-30 DIAGNOSIS — H25813 Combined forms of age-related cataract, bilateral: Secondary | ICD-10-CM

## 2020-05-30 DIAGNOSIS — E113392 Type 2 diabetes mellitus with moderate nonproliferative diabetic retinopathy without macular edema, left eye: Secondary | ICD-10-CM

## 2020-05-30 DIAGNOSIS — E113393 Type 2 diabetes mellitus with moderate nonproliferative diabetic retinopathy without macular edema, bilateral: Secondary | ICD-10-CM

## 2020-05-30 DIAGNOSIS — I10 Essential (primary) hypertension: Secondary | ICD-10-CM

## 2020-05-30 DIAGNOSIS — H35033 Hypertensive retinopathy, bilateral: Secondary | ICD-10-CM

## 2020-05-30 DIAGNOSIS — E113311 Type 2 diabetes mellitus with moderate nonproliferative diabetic retinopathy with macular edema, right eye: Secondary | ICD-10-CM

## 2020-08-24 ENCOUNTER — Other Ambulatory Visit: Payer: Self-pay

## 2020-08-24 ENCOUNTER — Encounter: Payer: Self-pay | Admitting: Internal Medicine

## 2020-08-24 ENCOUNTER — Ambulatory Visit (INDEPENDENT_AMBULATORY_CARE_PROVIDER_SITE_OTHER): Payer: Medicare HMO | Admitting: Internal Medicine

## 2020-08-24 VITALS — BP 137/62 | HR 87 | Temp 98.1°F | Ht 69.0 in | Wt 370.7 lb

## 2020-08-24 DIAGNOSIS — E785 Hyperlipidemia, unspecified: Secondary | ICD-10-CM

## 2020-08-24 DIAGNOSIS — R69 Illness, unspecified: Secondary | ICD-10-CM | POA: Diagnosis not present

## 2020-08-24 DIAGNOSIS — R87610 Atypical squamous cells of undetermined significance on cytologic smear of cervix (ASC-US): Secondary | ICD-10-CM

## 2020-08-24 DIAGNOSIS — I10 Essential (primary) hypertension: Secondary | ICD-10-CM

## 2020-08-24 DIAGNOSIS — I5032 Chronic diastolic (congestive) heart failure: Secondary | ICD-10-CM

## 2020-08-24 DIAGNOSIS — D509 Iron deficiency anemia, unspecified: Secondary | ICD-10-CM | POA: Insufficient documentation

## 2020-08-24 DIAGNOSIS — D500 Iron deficiency anemia secondary to blood loss (chronic): Secondary | ICD-10-CM

## 2020-08-24 DIAGNOSIS — F5089 Other specified eating disorder: Secondary | ICD-10-CM

## 2020-08-24 DIAGNOSIS — E119 Type 2 diabetes mellitus without complications: Secondary | ICD-10-CM

## 2020-08-24 DIAGNOSIS — I5043 Acute on chronic combined systolic (congestive) and diastolic (congestive) heart failure: Secondary | ICD-10-CM | POA: Insufficient documentation

## 2020-08-24 DIAGNOSIS — I5033 Acute on chronic diastolic (congestive) heart failure: Secondary | ICD-10-CM | POA: Insufficient documentation

## 2020-08-24 LAB — POCT GLYCOSYLATED HEMOGLOBIN (HGB A1C): Hemoglobin A1C: 8.7 % — AB (ref 4.0–5.6)

## 2020-08-24 LAB — GLUCOSE, CAPILLARY: Glucose-Capillary: 178 mg/dL — ABNORMAL HIGH (ref 70–99)

## 2020-08-24 MED ORDER — METFORMIN HCL ER 500 MG PO TB24: 500.0000 mg | ORAL_TABLET | Freq: Every day | ORAL | 3 refills | Status: DC

## 2020-08-24 MED ORDER — FUROSEMIDE 20 MG PO TABS: 20.0000 mg | ORAL_TABLET | Freq: Every day | ORAL | 2 refills | Status: DC

## 2020-08-24 MED ORDER — OZEMPIC (0.25 OR 0.5 MG/DOSE) 2 MG/1.5ML ~~LOC~~ SOPN: 0.2500 mg | PEN_INJECTOR | SUBCUTANEOUS | 0 refills | Status: DC

## 2020-08-24 MED ORDER — ATORVASTATIN CALCIUM 40 MG PO TABS: 40.0000 mg | ORAL_TABLET | Freq: Every day | ORAL | 3 refills | Status: DC

## 2020-08-24 MED ORDER — LISINOPRIL 10 MG PO TABS: 10.0000 mg | ORAL_TABLET | Freq: Every day | ORAL | 3 refills | Status: DC

## 2020-08-24 NOTE — Progress Notes (Signed)
Subjective:   Patient ID: [name] female   DOB: 09/15/1955 65 y.o.   MRN: [id number]  HPI: [name] is a 65 y.o. female with past medical history outlined below here for follow up of her chronic medical conditions and new complaint of shortness of breath. For the details of today's visit, please refer to the assessment and plan.   Past Medical History:  Diagnosis Date  . Anxiety   . Cataract    NS OD  . Chest pain 08/14/2016  . Complication of anesthesia    Per patient "needs a lot of anesthesia w/surgery" ,I had toruble with a spinal block not taking "  . Diabetes mellitus without complication (HCC)    Type 2  . Diabetic retinopathy ([place])    BDR  . Full dentures   . GERD (gastroesophageal reflux disease)   . History of surgery on arm    right arm broken  . Hyperlipidemia   . Hypertension    never diagnosed by physician per pt  . Macular degeneration   . Obesity    Current Outpatient Medications  Medication Sig Dispense Refill  . furosemide (LASIX) 20 MG tablet Take 1 tablet (20 mg total) by mouth daily. 30 tablet 2  . Semaglutide,0.25 or 0.5MG /DOS, (OZEMPIC, 0.25 OR 0.5 MG/DOSE,) 2 MG/1.5ML SOPN Inject 0.25 mg into the skin once a week. 1.5 mL 0  . acetaminophen (TYLENOL) 500 MG tablet Take 1,500 mg by mouth every 6 (six) hours as needed for moderate pain or headache.     Marland Kitchen aspirin 81 MG EC tablet Take 1 tablet (81 mg total) by mouth daily. (Patient taking differently: Take 81 mg by mouth every other day.) 30 tablet 0  . atorvastatin (LIPITOR) 40 MG tablet Take 1 tablet (40 mg total) by mouth daily. 90 tablet 3  . Hypromellose (ARTIFICIAL TEARS OP) Place 1 drop into both eyes daily as needed (for dry eyes).    Marland Kitchen ibuprofen (ADVIL,MOTRIN) 200 MG tablet Take 400 mg by mouth every 6 (six) hours as needed for headache or moderate pain.     Marland Kitchen lisinopril (ZESTRIL) 10 MG tablet Take 1 tablet (10 mg total) by mouth daily. 90 tablet 3  . Menthol, Topical  Analgesic, (BIOFREEZE EX) Apply 1 application topically daily as needed (back pain).    . metFORMIN (GLUCOPHAGE-XR) 500 MG 24 hr tablet Take 1 tablet (500 mg total) by mouth daily with breakfast. 90 tablet 3   No current facility-administered medications for this visit.   Family History  Problem Relation Age of Onset  . Kidney disease Mother   . Heart disease Mother   . Diabetes Mother   . Seizures Father   . Heart disease Father   . Other Father    Social History   Socioeconomic History  . Marital status: Divorced    Spouse name: Not on file  . Number of children: Not on file  . Years of education: Not on file  . Highest education level: Not on file  Occupational History  . Not on file  Tobacco Use  . Smoking status: Former Smoker    Packs/day: 0.25    Years: 6.00    Pack years: 1.50    Types: Cigarettes  . Smokeless tobacco: Never Used  . Tobacco comment: quit 36 yrs ago  Vaping Use  . Vaping Use: Never used  Substance and Sexual Activity  . Alcohol use: No  . Drug use: No  . Sexual activity: Not  Currently    Birth control/protection: None  Other Topics Concern  . Not on file  Social History Narrative  . Not on file   Social Determinants of Health   Financial Resource Strain: Not on file  Food Insecurity: Not on file  Transportation Needs: Not on file  Physical Activity: Not on file  Stress: Not on file  Social Connections: Not on file    Review of Systems: Review of Systems  Respiratory: Positive for shortness of breath. Negative for cough.   Cardiovascular: Positive for orthopnea and leg swelling. Negative for chest pain.     Objective:  Physical Exam:  Vitals:   08/24/20 0842 08/24/20 0915  BP: (!) 153/75 137/62  Pulse: 86 87  Temp: 98.1 F (36.7 C)   TempSrc: Oral   SpO2: 100%   Weight: (!) 370 lb 11.2 oz (168.1 kg)   Height: 5\' 9"  (1.753 m)     Physical Exam Constitutional:      Appearance: Normal appearance. She is obese.   Cardiovascular:     Rate and Rhythm: Normal rate and regular rhythm.     Heart sounds: No murmur heard.     Comments: Third heart sound appreciated Pulmonary:     Effort: Pulmonary effort is normal. No respiratory distress.     Breath sounds: Normal breath sounds.  Musculoskeletal:     Right lower leg: Edema present.     Left lower leg: Edema present.     Comments: 2+ pitting edema limited to the feet bilaterally   Neurological:     General: No focal deficit present.     Mental Status: She is alert and oriented to person, place, and time.  Psychiatric:        Mood and Affect: Mood normal.        Behavior: Behavior normal.      Assessment & Plan:   See Encounters Tab for problem based charting.

## 2020-08-24 NOTE — Assessment & Plan Note (Signed)
Patient has been out of her Lipitor for a few months.  Rechecking lipid panel.  I have sent refills to her pharmacy. -- Restart Lipitor 40 mg daily

## 2020-08-24 NOTE — Assessment & Plan Note (Signed)
Patient here with complaint of intermittent shortness of breath and progressive dyspnea on exertion.  She is also complaining of new lower extremity swelling and endorses orthopnea on ROS.  Usually requires multiple pillows to prop herself up to sleep.  Denies PND.  On exam her lungs are clear however she has new 2+ pitting edema of her bilateral feet up to her ankles.  JVD is difficult to assess due to body habitus.  Heart sounds are distant, regular rate and rhythm, and no murmurs but I do appreciate a third heart sound.  She has a history of chronic uncontrolled hypertension and prior echocardiogram in 2018 demonstrated concentric moderate LVH with grade 2 diastolic dysfunction.  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  We discussed the importance of strict blood pressure control and compliance moving forward.  Plan to repeat echocardiogram.  I have started her on low-dose Lasix 20 mg once daily.  Restarting lisinopril 10 mg daily for her hypertension and checking BMP.  I have asked her to follow-up with me again in 1 month. -- Repeat echocardiogram -- Restart lisinopril 10 mg daily -- Start Lasix 20 mg once daily -- Follow-up 1 month

## 2020-08-24 NOTE — Assessment & Plan Note (Signed)
Diabetes is chronic and uncontrolled, hemoglobin A1c today is 8.7.  Again, she has not been taking her medications.  Previously prescribed metformin extended release 500 mg once a day.  In light of her uncontrolled hypertension, new HFpEF diagnosis, and obesity, we discussed additional therapy with a GLP-1 agonist.  We had previously tried oral semaglutide which was not covered by her insurance.  We will try the subcutaneous formulation.  Instructed to call if not affordable.  -- Restart metformin XR 500 mg once daily  -- Start subcutaneous semaglutide, 0.25 mg once weekly.  Plan to uptitrate as tolerated at her follow-up in 1 month.

## 2020-08-24 NOTE — Assessment & Plan Note (Signed)
Patient reports ice cravings and excessive ice consumption throughout the day, asking if this could be related to her lower extremity swelling.  We discussed a reasonable fluid limitation of 1.8 - 2L per day to prevent decompensated HFpEF.  I am checking CBC and iron studies to rule out iron deficiency anemia.

## 2020-08-24 NOTE — Assessment & Plan Note (Signed)
Patient has history of abnormal vaginal bleeding and was referred to gynecology.  Underwent Pap smear and found to have a cervical polyp that was removed.  Pap smear resulted as AGCUS.  She has declined to follow-up with gynecology for colposcopy.  This will need to be readdressed at her follow-up in 1 month.

## 2020-08-24 NOTE — Patient Instructions (Addendum)
Ms. Beem,  It was a pleasure to see you. Today we discussed:   Heart Failure: I have ordered a repeat echocardiogram of your heart. You will be called to schedule this. Please take your blood pressure medicine as prescribed. I have started you on a fluid pill called lasix. I will call you with the results of your blood work.   Blood pressure: Please restart your lisinopril. I am holding the amlodipine for now. Please limit the use of ibuprofen. Avoid daily use.   Diabetes: Please restart your metformin twice daily. I have also sent in a prescription for semaglutdie (aka ozempic) to your pharmacy. Please inject this under the skin once a week.   Follow up with me again in 1 month. For your blood pressure, heart failure, and repeat labs. If you have any questions or concerns, call our clinic at [phone] or after hours call [phone] and ask for the internal medicine resident on call.  Thank you!  Dr. Darnell Level

## 2020-08-24 NOTE — Assessment & Plan Note (Addendum)
Chronic and uncontrolled.  She has been off her antihypertensives for a few months now.  She has not been seen in over a year.  At that visit her lisinopril was restarted and she was continued on amlodipine with plans for 34-month follow-up.  Unfortunately she has not been seen since then and ran out of her medication months ago.  Initial blood pressure reading today was 153/75, but improved to 137/62 on recheck.  She does check her blood pressure at home intermittently and reports her blood pressure yesterday was 169 systolic.  It is possible there is a component of whitecoat hypertension however she has evidence of LVH on prior EKGs and moderate left ventricular hypertrophy with grade 2 diastolic function on prior echocardiogram in 2018.  She is also presenting with new signs and symptoms of heart failure (see diastolic heart failure A&P).  Plan to restart lisinopril 10 mg daily.  Checking BMP and starting Lasix 20 mg once daily for her heart failure. -- Restart lisinopril 10 mg daily; Lasix 20 mg daily for HFpEF and lower extremity swelling -- Follow-up BMP -- Follow-up 1 month

## 2020-08-25 ENCOUNTER — Telehealth: Payer: Self-pay | Admitting: *Deleted

## 2020-08-25 DIAGNOSIS — E119 Type 2 diabetes mellitus without complications: Secondary | ICD-10-CM

## 2020-08-25 LAB — CBC
Hematocrit: 34.1 % (ref 34.0–46.6)
Hemoglobin: 9.7 g/dL — ABNORMAL LOW (ref 11.1–15.9)
MCH: 21.2 pg — ABNORMAL LOW (ref 26.6–33.0)
MCHC: 28.4 g/dL — ABNORMAL LOW (ref 31.5–35.7)
MCV: 75 fL — ABNORMAL LOW (ref 79–97)
Platelets: 277 10*3/uL (ref 150–450)
RBC: 4.58 x10E6/uL (ref 3.77–5.28)
RDW: 14.6 % (ref 11.7–15.4)
WBC: 5.3 10*3/uL (ref 3.4–10.8)

## 2020-08-25 LAB — BMP8+ANION GAP
Anion Gap: 15 mmol/L (ref 10.0–18.0)
BUN/Creatinine Ratio: 13 (ref 12–28)
BUN: 14 mg/dL (ref 8–27)
CO2: 23 mmol/L (ref 20–29)
Calcium: 9.1 mg/dL (ref 8.7–10.3)
Chloride: 99 mmol/L (ref 96–106)
Creatinine, Ser: 1.04 mg/dL — ABNORMAL HIGH (ref 0.57–1.00)
Glucose: 182 mg/dL — ABNORMAL HIGH (ref 65–99)
Potassium: 4.5 mmol/L (ref 3.5–5.2)
Sodium: 137 mmol/L (ref 134–144)
eGFR: 61 mL/min/{1.73_m2} (ref >59)

## 2020-08-25 LAB — IRON AND TIBC
Iron Saturation: 8 % — CL (ref 15–55)
Iron: 34 ug/dL (ref 27–139)
Total Iron Binding Capacity: 437 ug/dL (ref 250–450)
UIBC: 403 ug/dL — ABNORMAL HIGH (ref 118–369)

## 2020-08-25 LAB — LIPID PANEL
Chol/HDL Ratio: 3.1 ratio (ref 0.0–4.4)
Cholesterol, Total: 159 mg/dL (ref 100–199)
HDL: 51 mg/dL (ref >39)
LDL Chol Calc (NIH): 94 mg/dL (ref 0–99)
Triglycerides: 71 mg/dL (ref 0–149)
VLDL Cholesterol Cal: 14 mg/dL (ref 5–40)

## 2020-08-25 LAB — FERRITIN: Ferritin: 19 ng/mL (ref 15–150)

## 2020-08-25 MED ORDER — FERROUS SULFATE 325 (65 FE) MG PO TABS: 325.0000 mg | ORAL_TABLET | Freq: Every day | ORAL | 3 refills | Status: DC

## 2020-08-25 NOTE — Telephone Encounter (Signed)
Called pt - no answer; left message to call the office . 

## 2020-08-25 NOTE — Addendum Note (Signed)
Addended by: Jodean Lima on: 08/25/2020 10:38 AM   Modules accepted: Orders

## 2020-08-25 NOTE — Telephone Encounter (Signed)
-----   Message from Velna Ochs, MD sent at 08/25/2020 10:35 AM EDT ----- Unable to reach patient, attempted to call twice. Left voicemail. Iron is very low, I have sent a prescription to her pharmacy for her to start iron supplementation. She also has worsening anemia and needs to follow up with gynecology for her uterine bleeding and prior abnormal PAP smear. I have placed a new referral. She has follow up scheduled with me next month, will repeat labs then.

## 2020-08-26 MED ORDER — DULAGLUTIDE 0.75 MG/0.5ML ~~LOC~~ SOAJ: 0.7500 mg | SUBCUTANEOUS | 2 refills | Status: DC

## 2020-08-26 NOTE — Telephone Encounter (Signed)
Thank you :)

## 2020-08-26 NOTE — Telephone Encounter (Signed)
Ok, thank you. I sent a prescription for trulicity instead for a price check. Can we contact the pharmacy? [facility] in [place] on [address]. Please tell her to use one or the other if she ends up picking up the prescription. Not both.

## 2020-08-26 NOTE — Telephone Encounter (Signed)
Pt aware of lab results and will pick up iron supplementation.  Pt also expressed difficulty with obtaining semaglutide and hasnt picked it up yet.  States copay is almost $100-she will be checking with the pharmacy to see if that price is for one or three mth supply and will call back on Tue 5/31 to let [facility] know if she will be able to get it or not.   Will send message to pcp and pharmacy staff for assistance (alternatives? Pt assistance programs?).

## 2020-09-16 ENCOUNTER — Ambulatory Visit (HOSPITAL_COMMUNITY): Payer: Medicare HMO

## 2020-09-21 ENCOUNTER — Ambulatory Visit (INDEPENDENT_AMBULATORY_CARE_PROVIDER_SITE_OTHER): Payer: Medicare HMO | Admitting: Internal Medicine

## 2020-09-21 ENCOUNTER — Encounter: Payer: Self-pay | Admitting: Internal Medicine

## 2020-09-21 ENCOUNTER — Other Ambulatory Visit: Payer: Self-pay

## 2020-09-21 ENCOUNTER — Other Ambulatory Visit: Payer: Self-pay | Admitting: Internal Medicine

## 2020-09-21 VITALS — BP 151/75 | HR 88 | Temp 98.1°F | Ht 69.0 in | Wt 365.8 lb

## 2020-09-21 DIAGNOSIS — D500 Iron deficiency anemia secondary to blood loss (chronic): Secondary | ICD-10-CM | POA: Diagnosis not present

## 2020-09-21 DIAGNOSIS — I5032 Chronic diastolic (congestive) heart failure: Secondary | ICD-10-CM | POA: Diagnosis not present

## 2020-09-21 DIAGNOSIS — E119 Type 2 diabetes mellitus without complications: Secondary | ICD-10-CM

## 2020-09-21 DIAGNOSIS — Z0000 Encounter for general adult medical examination without abnormal findings: Secondary | ICD-10-CM

## 2020-09-21 DIAGNOSIS — E785 Hyperlipidemia, unspecified: Secondary | ICD-10-CM

## 2020-09-21 DIAGNOSIS — I10 Essential (primary) hypertension: Secondary | ICD-10-CM | POA: Diagnosis not present

## 2020-09-21 MED ORDER — DAPAGLIFLOZIN PROPANEDIOL 5 MG PO TABS: 10.0000 mg | ORAL_TABLET | Freq: Every day | ORAL | 2 refills | Status: DC

## 2020-09-21 MED ORDER — FUROSEMIDE 20 MG PO TABS: 20.0000 mg | ORAL_TABLET | Freq: Every day | ORAL | 2 refills | Status: DC | PRN

## 2020-09-21 NOTE — Assessment & Plan Note (Signed)
Unfortunately she was unable to afford the co pay for ozempic and trulicity. Both covered but with a $50 monthly co pay. Given her worsening HF symptoms, will go ahead and start [place] for additional control. She has preserved EF on last echo however we are repeating this. Wilder Glade will hopefully help with her BP some as well.  -- Continue metformin XR 500 mg daily -- Start farxiga 10 mg daily  -- Follow up 2 months for Hgb A1c recheck ; up titrate metformin as tolerated

## 2020-09-21 NOTE — Assessment & Plan Note (Signed)
Symptoms have improved with PRN lasix. She still has not completed her echocardiogram. She was provided with information to reschedule this. Follow up results.

## 2020-09-21 NOTE — Assessment & Plan Note (Addendum)
Found to have IDA on labs last month, secondary to her known abnormal uterine bleeding. She has a history of cervical polyp s/p removal and AGCUS on her last pap smear, but she has not followed up with gynecology for colposcopy. New referral placed at her last visit, she is scheduled for 7/27. Gave patient appointment information, instructed her to keep this appointment.  Repeat iron studies at follow up with me in 2 months.

## 2020-09-21 NOTE — Assessment & Plan Note (Signed)
Patient is overdue for multiple health maintenance screening and immunization. In the past she has been very resistant to all vaccination. She has an appointment scheduled with gynecology for a PAP smear on 7/27. She is also overdue for a mammogram. If not addressed by gynecology will place order at her next visit and continue to address vaccine hesitancy.

## 2020-09-21 NOTE — Assessment & Plan Note (Signed)
Restarted lipitor 40 mg one month ago. Repeating lipid panel today.

## 2020-09-21 NOTE — Patient Instructions (Addendum)
Tina Lara,  It was a pleasure to see you. Please take your blood pressure medicine, the lisinopril every day. The lasix you can take as needed for swelling or shortness of breath. The iron you can take every other day if it is causing constipation.   Please keep your scheduled appointment with gynecology next month.   Our office is working to reschedule your echocardiogram.   I have started you on a medication called farxiga (aka dapagliflozin). This is both for your diabetes and for your heart failure.   Otherwise, continue to take your medicines as previously prescribed.   If you have any questions or concerns, call our clinic at [phone] or after hours call [phone] and ask for the internal medicine resident on call. Thank you!  Dr. Philipp Ovens

## 2020-09-21 NOTE — Assessment & Plan Note (Signed)
Uncontrolled, 151/75. She was restarted on her lisinopril at her last visit. But admits she does not take this everyday. Says the lasix and lisinopril cause cramping when she takes them daily. She was also in the ER overnight with a friend and has not slept or taken her medicine yet today. Advised her to take lisinopril daily as prescribed. Ok for her to take the lasix PRN for swelling and SOB. Her LE edema is much improved on today's exam.  -- Checking BMP -- Lisinopril 10 mg daily, stressed compliance  -- Lasix PRN

## 2020-09-22 LAB — LIPID PANEL
Chol/HDL Ratio: 2.9 ratio (ref 0.0–4.4)
Cholesterol, Total: 119 mg/dL (ref 100–199)
HDL: 41 mg/dL (ref >39)
LDL Chol Calc (NIH): 64 mg/dL (ref 0–99)
Triglycerides: 68 mg/dL (ref 0–149)
VLDL Cholesterol Cal: 14 mg/dL (ref 5–40)

## 2020-09-22 LAB — BMP8+ANION GAP
Anion Gap: 13 mmol/L (ref 10.0–18.0)
BUN/Creatinine Ratio: 15 (ref 12–28)
BUN: 14 mg/dL (ref 8–27)
CO2: 23 mmol/L (ref 20–29)
Calcium: 8.6 mg/dL — ABNORMAL LOW (ref 8.7–10.3)
Chloride: 102 mmol/L (ref 96–106)
Creatinine, Ser: 0.92 mg/dL (ref 0.57–1.00)
Glucose: 190 mg/dL — ABNORMAL HIGH (ref 65–99)
Potassium: 4.4 mmol/L (ref 3.5–5.2)
Sodium: 138 mmol/L (ref 134–144)
eGFR: 70 mL/min/{1.73_m2} (ref >59)

## 2020-09-27 ENCOUNTER — Telehealth: Payer: Self-pay | Admitting: *Deleted

## 2020-09-27 DIAGNOSIS — E119 Type 2 diabetes mellitus without complications: Secondary | ICD-10-CM

## 2020-09-27 MED ORDER — DAPAGLIFLOZIN PROPANEDIOL 10 MG PO TABS: 10.0000 mg | ORAL_TABLET | Freq: Every day | ORAL | 2 refills | Status: DC

## 2020-09-27 NOTE — Telephone Encounter (Signed)
Call to [facility] for PA for Farxiga 5 mg tablets.  [place] limit of 1 per day with patients plan.  Patient needs to take 10 mg daily.  Fariga 10 mg also has a [place] limit of 1 per day.  Message to be sent to Dr.Guilloud to see if patient can be changed to the 10 mg tablet and take 1 daily.  The change would not require a PA.  Sander Nephew, RN 09/27/2020 9:42 AM.

## 2020-09-27 NOTE — Telephone Encounter (Signed)
Thank you :)

## 2020-09-27 NOTE — Telephone Encounter (Signed)
New prescription sent for the 10 mg tablets. Thank you!

## 2020-10-04 ENCOUNTER — Encounter: Payer: Self-pay | Admitting: *Deleted

## 2020-10-12 ENCOUNTER — Encounter: Payer: Self-pay | Admitting: *Deleted

## 2020-10-12 NOTE — Progress Notes (Unsigned)

## 2020-10-17 NOTE — Progress Notes (Unsigned)
Things That May Be Affecting Your Health:  Alcohol  Hearing loss  Pain    Depression  Home Safety  Sexual Health   Diabetes  Lack of physical activity  Stress   Difficulty with daily activities  Loneliness  Tiredness   Drug use  Medicines  Tobacco use   Falls  Motor Vehicle Safety  Weight   Food choices  Oral Health  Other    YOUR PERSONALIZED HEALTH PLAN : 1. Schedule your next subsequent Medicare Wellness visit in one year 2. Attend all of your regular appointments to address your medical issues 3. Complete the preventative screenings and services   Annual Wellness Visit   Medicare Covered Preventative Screenings and [place] Men and Women Who How Often Need? Date of Last Service Action  Abdominal Aortic Aneurysm Adults with AAA risk factors Once      Alcohol Misuse and Counseling All Adults Screening once a year if no alcohol misuse. Counseling up to 4 face to face sessions.     Bone Density Measurement  Adults at risk for osteoporosis Once every 2 yrs      Lipid Panel Z13.6 All adults without CV disease Once every 5 yrs       Colorectal Cancer  Stool sample or Colonoscopy All adults 7 and older  Once every year Every 10 years        Depression All Adults Once a year  Today   Diabetes Screening Blood glucose, post glucose load, or GTT Z13.1 All adults at risk Pre-diabetics Once per year Twice per year      Diabetes  Self-Management Training All adults Diabetics 10 hrs first year; 2 hours subsequent years. Requires Copay     Glaucoma Diabetics Family history of glaucoma African Americans 29 yrs + Hispanic Americans 39 yrs + Annually - requires coppay      Hepatitis C Z72.89 or F19.20 High Risk for HCV Born between 1945 and 1965 Annually Once      HIV Z11.4 All adults based on risk Annually btw ages 57 & 29 regardless of risk Annually > 65 yrs if at increased risk      Lung Cancer Screening Asymptomatic adults aged 74-77 with 30 pack  yr history and current smoker OR quit within the last 15 yrs Annually Must have counseling and shared decision making documentation before first screen      Medical Nutrition Therapy Adults with  Diabetes Renal disease Kidney transplant within past 3 yrs 3 hours first year; 2 hours subsequent years Yes   Please offer  Obesity and Counseling All adults Screening once a year Counseling if BMI 30 or higher Yes Today  Please offer  Tobacco Use Counseling Adults who use tobacco  Up to 8 visits in one year     Vaccines Z23 Hepatitis B Influenza  Pneumonia  Adults  Once Once every flu season Two different vaccines separated by one year Yes   Needs PCV20 & TDap  Next Annual Wellness Visit People with Medicare Every year  Today     Services & Screenings Women Who How Often Need  Date of Last Service Action  Mammogram  Z12.31 Women over 63 One baseline ages 58-39. Annually ager 73 yrs+ Yes 2013   Has appointment with OBGYN on 7/27  Pap tests All women Annually if high risk. Every 2 yrs for normal risk women Yes    Has appointment with OBGYN on 7/27 (prior abnormal PAP results)   Screening for  cervical cancer with  Pap (Z01.419 nl or Z01.411abnl) & HPV Z11.51 Women aged 42 to 52 Once every 5 yrs     Screening pelvic and breast exams All women Annually if high risk. Every 2 yrs for normal risk women     Sexually Transmitted Diseases Chlamydia Gonorrhea Syphilis All at risk adults Annually for non pregnant females at increased risk         [place] Men Who How Ofter Need  Date of Last Service Action  Prostate Cancer - DRE & PSA Men over 50 Annually.  DRE might require a copay.        Sexually Transmitted Diseases Syphilis All at risk adults Annually for men at increased risk      Health Maintenance List Health Maintenance  Topic Date Due   PNEUMOCOCCAL POLYSACCHARIDE VACCINE AGE 11-64 HIGH RISK  Never done   COVID-19 Vaccine (1) Never done   Hepatitis C  Screening  Never done   TETANUS/TDAP  Never done   Zoster Vaccines- Shingrix (1 of 2) Never done   MAMMOGRAM  09/27/2013   PAP SMEAR-Modifier  02/05/2020   INFLUENZA VACCINE  10/31/2020   HEMOGLOBIN A1C  11/24/2020   OPHTHALMOLOGY EXAM  02/07/2021   FOOT EXAM  08/24/2021   COLONOSCOPY (Pts 45-46yrs Insurance coverage will need to be confirmed)  10/01/2027   HIV Screening  Completed   Pneumococcal Vaccine 72-26 Years old  Aged Out   HPV VACCINES  Aged Out

## 2020-10-26 ENCOUNTER — Other Ambulatory Visit: Payer: Self-pay | Admitting: Internal Medicine

## 2020-10-26 ENCOUNTER — Encounter: Payer: Medicare HMO | Admitting: Certified Nurse Midwife

## 2020-10-26 DIAGNOSIS — Z1231 Encounter for screening mammogram for malignant neoplasm of breast: Secondary | ICD-10-CM

## 2020-11-02 ENCOUNTER — Ambulatory Visit
Admission: RE | Admit: 2020-11-02 | Discharge: 2020-11-02 | Disposition: A | Discharge status: Home / Self Care | Payer: Medicare HMO | Source: Ambulatory Visit | Attending: Internal Medicine | Admitting: Internal Medicine

## 2020-11-02 ENCOUNTER — Other Ambulatory Visit: Payer: Self-pay

## 2020-11-02 DIAGNOSIS — Z1231 Encounter for screening mammogram for malignant neoplasm of breast: Secondary | ICD-10-CM

## 2020-11-23 NOTE — Addendum Note (Signed)
Addended by: Hulan Fray on: 11/23/2020 06:28 PM   Modules accepted: Orders

## 2020-12-07 ENCOUNTER — Encounter: Payer: Self-pay | Admitting: Internal Medicine

## 2021-03-31 DIAGNOSIS — Z20822 Contact with and (suspected) exposure to covid-19: Secondary | ICD-10-CM | POA: Diagnosis not present

## 2021-09-05 ENCOUNTER — Encounter: Payer: Self-pay | Admitting: Dietician

## 2021-09-06 ENCOUNTER — Other Ambulatory Visit: Payer: Self-pay

## 2021-09-06 ENCOUNTER — Encounter: Payer: Self-pay | Admitting: Internal Medicine

## 2021-09-06 ENCOUNTER — Ambulatory Visit (INDEPENDENT_AMBULATORY_CARE_PROVIDER_SITE_OTHER): Payer: Medicare Other | Admitting: Internal Medicine

## 2021-09-06 VITALS — BP 154/83 | HR 76 | Temp 97.8°F | Ht 69.0 in | Wt 360.8 lb

## 2021-09-06 DIAGNOSIS — I110 Hypertensive heart disease with heart failure: Secondary | ICD-10-CM

## 2021-09-06 DIAGNOSIS — L304 Erythema intertrigo: Secondary | ICD-10-CM

## 2021-09-06 DIAGNOSIS — I10 Essential (primary) hypertension: Secondary | ICD-10-CM

## 2021-09-06 DIAGNOSIS — Z87891 Personal history of nicotine dependence: Secondary | ICD-10-CM

## 2021-09-06 DIAGNOSIS — I5032 Chronic diastolic (congestive) heart failure: Secondary | ICD-10-CM

## 2021-09-06 DIAGNOSIS — D500 Iron deficiency anemia secondary to blood loss (chronic): Secondary | ICD-10-CM

## 2021-09-06 DIAGNOSIS — E119 Type 2 diabetes mellitus without complications: Secondary | ICD-10-CM

## 2021-09-06 DIAGNOSIS — Z7984 Long term (current) use of oral hypoglycemic drugs: Secondary | ICD-10-CM

## 2021-09-06 DIAGNOSIS — E785 Hyperlipidemia, unspecified: Secondary | ICD-10-CM | POA: Diagnosis not present

## 2021-09-06 LAB — POCT GLYCOSYLATED HEMOGLOBIN (HGB A1C): Hemoglobin A1C: 7.5 % — AB (ref 4.0–5.6)

## 2021-09-06 LAB — GLUCOSE, CAPILLARY: Glucose-Capillary: 146 mg/dL — ABNORMAL HIGH (ref 70–99)

## 2021-09-06 MED ORDER — FUROSEMIDE 40 MG PO TABS: 40.0000 mg | ORAL_TABLET | Freq: Every day | ORAL | 11 refills | Status: DC | PRN

## 2021-09-06 MED ORDER — NYSTATIN 100000 UNIT/GM EX POWD: 1.0000 "application " | Freq: Three times a day (TID) | CUTANEOUS | 0 refills | Status: DC

## 2021-09-06 MED ORDER — DAPAGLIFLOZIN PROPANEDIOL 10 MG PO TABS: 10.0000 mg | ORAL_TABLET | Freq: Every day | ORAL | 2 refills | Status: DC

## 2021-09-06 MED ORDER — ATORVASTATIN CALCIUM 40 MG PO TABS: 40.0000 mg | ORAL_TABLET | Freq: Every day | ORAL | 3 refills | Status: DC

## 2021-09-06 MED ORDER — LISINOPRIL 10 MG PO TABS: 10.0000 mg | ORAL_TABLET | Freq: Every day | ORAL | 3 refills | Status: DC

## 2021-09-06 MED ORDER — FERROUS SULFATE 325 (65 FE) MG PO TABS: 325.0000 mg | ORAL_TABLET | Freq: Every day | ORAL | 3 refills | Status: DC

## 2021-09-06 NOTE — Assessment & Plan Note (Signed)
Patient presented with complaints of a rash in the skin folds of her back.  She has tried several topical options at home, including which is hazel, which have not brought relief of her symptoms.  Plan: -Nystatin powder

## 2021-09-06 NOTE — Progress Notes (Signed)
   CC: routine follow-up  HPI:  [name] is a 66 y.o. with past medical history as noted below who presents to the clinic today for a routine follow-up. Please see problem-based list for further details, assessments, and plans.   Past Medical History:  Diagnosis Date   Anxiety    Cataract    NS OD   Chest pain 37/16/9678   Complication of anesthesia    Per patient "needs a lot of anesthesia w/surgery" ,I had toruble with a spinal block not taking "   Diabetes mellitus without complication (HCC)    Type 2   Diabetic retinopathy ([place])    BDR   Full dentures    GERD (gastroesophageal reflux disease)    History of surgery on arm    right arm broken   Hyperlipidemia    Hypertension    never diagnosed by physician per pt   Macular degeneration    Obesity    Review of Systems: Negative aside from that listed in individualized problem based charting.   Physical Exam:  Vitals:   09/06/21 0838 09/06/21 0847  BP: (!) 159/90 (!) 154/83  Pulse: 84 76  Temp: 97.8 F (36.6 C)   TempSrc: Oral   SpO2: 100%   Weight: (!) 360 lb 12.8 oz (163.7 kg)   Height: '5\' 9"'$  (1.753 m)    General: NAD, nl appearance HE: Normocephalic, atraumatic, EOMI, Conjunctivae normal ENT: No congestion, no rhinorrhea, no exudate or erythema  Cardiovascular: Normal rate, regular rhythm. No murmurs, rubs, or gallops, unable to appreciate JVD due to body habitus.  There is 1-2+ pitting edema in BLE. Pulmonary: Effort normal, breath sounds normal. No wheezes, rales, or rhonchi Abdominal: soft, nontender, bowel sounds present Musculoskeletal: no deformity, injury or tenderness in extremities Skin: Mildly erythematous patches in bilateral skin folds Psychiatric/Behavioral: normal mood, normal behavior      Assessment & Plan:   See Encounters Tab for problem based charting.  Patient discussed with Dr.  Saverio Danker

## 2021-09-06 NOTE — Assessment & Plan Note (Addendum)
BP Readings from Last 3 Encounters:  09/06/21 (!) 154/83  09/21/20 (!) 151/75  08/24/20 137/62   The patient has a history of uncontrolled HTN. Has been on lisinopril 10 mg daily for this, but she states that she ran out of this medication months ago.  She was also on Lasix 20 mg as needed for swelling, however she has also run out of this medication.  A few months ago, when she had enough Lasix, she states that the 20 mg did not help decrease her swelling.  She will take 2 of her 20 mg Lasix and then noticed that her swelling improved.  Exam: Unremarkable lung exam.  There is 1-2+ pitting edema in BLE.  JVD was unable to be appreciated due to body habitus.  Plan: No emergent need for diuresis at this time. -Repeat BMP -Continue lisinopril 10 mg daily, stressed compliance -Increase Lasix to 40 mg as needed

## 2021-09-06 NOTE — Assessment & Plan Note (Signed)
A1c of 7.5 today, down from 8.7 about a year ago.  During her last visit, plan was to continue metformin 500 mg daily and Farxiga 10 mg daily.  The patient states that she ran out of these medications months ago.  However, when asked about metformin use, she states that she is unable to tolerate the medication due to GI side effects.  She even experiences the side effects with the lowest dose of 500 mg daily.    Plan: Refilled Farxiga 10 mg daily.  If A1c is uncontrolled at next follow-up in 3 months, can consider adding GLP-1.  Patient is amenable to this plan.

## 2021-09-06 NOTE — Patient Instructions (Signed)
Thank you, Tina Lara for allowing [place] to provide your care today. Today we discussed your diabetes, skin rash, and shortness of breath.    I have refilled all of your medications. Please take all of your medications as prescribed, especially for hypertension, diabetes, and shortness of breath.  I have increased your lasix to 40 mg as needed for shortness of breath and swelling.  I have prescribed some nystatin powder for your skin rash.  We will restart your farxiga and try this for a few months. If this does not work, we can consider adding ozempic or other agents.     I have ordered the following labs for you:   Lab Orders         BMP8+Anion Gap         Lipid Profile         Microalbumin / Creatinine Urine Ratio         Glucose, capillary         POC Hbg A1C       Referrals ordered today:   Referral Orders  No referral(s) requested today     I have ordered the following medication/changed the following medications:    Start the following medications: Meds ordered this encounter  Medications   atorvastatin (LIPITOR) 40 MG tablet    Sig: Take 1 tablet (40 mg total) by mouth daily.    Dispense:  90 tablet    Refill:  3   dapagliflozin propanediol (FARXIGA) 10 MG TABS tablet    Sig: Take 1 tablet (10 mg total) by mouth daily before breakfast.    Dispense:  30 tablet    Refill:  2   ferrous sulfate 325 (65 FE) MG tablet    Sig: Take 1 tablet (325 mg total) by mouth daily.    Dispense:  30 tablet    Refill:  3   lisinopril (ZESTRIL) 10 MG tablet    Sig: Take 1 tablet (10 mg total) by mouth daily.    Dispense:  90 tablet    Refill:  3   furosemide (LASIX) 40 MG tablet    Sig: Take 1 tablet (40 mg total) by mouth daily as needed. Take by mouth daily as needed (swelling or shortness of breath).    Dispense:  30 tablet    Refill:  11   nystatin powder    Sig: Apply 1 application. topically 3 (three) times daily.    Dispense:  30 g    Refill:  0      Follow up: 3 months    Should you have any questions or concerns please call the internal medicine clinic at [phone].

## 2021-09-07 LAB — BMP8+ANION GAP
Anion Gap: 15 mmol/L (ref 10.0–18.0)
BUN/Creatinine Ratio: 17 (ref 12–28)
BUN: 18 mg/dL (ref 8–27)
CO2: 20 mmol/L (ref 20–29)
Calcium: 8.9 mg/dL (ref 8.7–10.3)
Chloride: 104 mmol/L (ref 96–106)
Creatinine, Ser: 1.08 mg/dL — ABNORMAL HIGH (ref 0.57–1.00)
Glucose: 141 mg/dL — ABNORMAL HIGH (ref 70–99)
Potassium: 4.5 mmol/L (ref 3.5–5.2)
Sodium: 139 mmol/L (ref 134–144)
eGFR: 58 mL/min/{1.73_m2} — ABNORMAL LOW (ref >59)

## 2021-09-07 LAB — LIPID PANEL
Chol/HDL Ratio: 3.6 ratio (ref 0.0–4.4)
Cholesterol, Total: 118 mg/dL (ref 100–199)
HDL: 33 mg/dL — ABNORMAL LOW (ref >39)
LDL Chol Calc (NIH): 72 mg/dL (ref 0–99)
Triglycerides: 57 mg/dL (ref 0–149)
VLDL Cholesterol Cal: 13 mg/dL (ref 5–40)

## 2021-09-07 LAB — MICROALBUMIN / CREATININE URINE RATIO
Creatinine, Urine: 87.1 mg/dL
Microalb/Creat Ratio: 259 mg/g creat — ABNORMAL HIGH (ref 0–29)
Microalbumin, Urine: 225.2 ug/mL

## 2021-09-07 NOTE — Progress Notes (Signed)
Internal Medicine Clinic Attending  Case discussed with Dr. Lorin Glass  At the time of the visit.  We reviewed the resident's history and exam and pertinent patient test results.  I agree with the assessment, diagnosis, and plan of care documented in the resident's note. Continued lower extremity edema, no other evidence of acute on chronic HF exacerbation on resident history/exam. Weight stable. Tina Lara has had some difficulty with medication adherence, she was counseled on this by Dr. Lorin Glass today.

## 2021-09-08 ENCOUNTER — Telehealth: Payer: Self-pay | Admitting: Internal Medicine

## 2021-09-08 NOTE — Telephone Encounter (Signed)
Attempted to call pt with lab results. No answer, left VM to call the clinic back.

## 2021-09-11 ENCOUNTER — Other Ambulatory Visit: Payer: Self-pay | Admitting: Internal Medicine

## 2021-09-11 MED ORDER — KETOCONAZOLE 2 % EX CREA: 1.0000 "application " | TOPICAL_CREAM | Freq: Every day | CUTANEOUS | 0 refills | Status: AC

## 2021-09-11 NOTE — Progress Notes (Signed)
Pt contacted with lab results. Discussed moderate proteinuria and emphasized adherence to BP regimen with lisinopril. Pt is in agreement with this plan. Pt also requesting a different medication for her rash, stating that the nystatin powder was too difficult to apply and expensive for the small bottle that she received.  -DC nystatin powder, prescribed 2 weeks of ketoconazole cream instead

## 2022-01-02 ENCOUNTER — Telehealth: Payer: Self-pay | Admitting: *Deleted

## 2022-01-02 NOTE — Telephone Encounter (Signed)
Call from patient states is having some shortness of breath sometimes with swelling in her legs.  Is on Lasix and has some cramping when she takes it.  Noticed it this last weekend.  Was on feet at [facility] for awhile.  Had a little shortness of breath this morning when walking down her hall at home.  No shortness of breath presently.  Offered an appointment stated could not come until 10/15/ish. Problems with transportation.  Patient was advised to go to the ER or Urgent Care if the symptoms worsen.

## 2022-01-15 ENCOUNTER — Other Ambulatory Visit: Payer: Self-pay

## 2022-01-15 ENCOUNTER — Ambulatory Visit (HOSPITAL_COMMUNITY)
Admission: RE | Admit: 2022-01-15 | Discharge: 2022-01-15 | Disposition: A | Discharge status: Home / Self Care | Payer: Medicare Other | Source: Ambulatory Visit | Attending: Internal Medicine | Admitting: Internal Medicine

## 2022-01-15 ENCOUNTER — Ambulatory Visit (INDEPENDENT_AMBULATORY_CARE_PROVIDER_SITE_OTHER): Payer: Medicare Other | Admitting: Internal Medicine

## 2022-01-15 ENCOUNTER — Encounter: Payer: Self-pay | Admitting: Internal Medicine

## 2022-01-15 VITALS — BP 142/74 | HR 86 | Temp 97.9°F | Ht 69.0 in | Wt 367.2 lb

## 2022-01-15 DIAGNOSIS — R87610 Atypical squamous cells of undetermined significance on cytologic smear of cervix (ASC-US): Secondary | ICD-10-CM

## 2022-01-15 DIAGNOSIS — Z0000 Encounter for general adult medical examination without abnormal findings: Secondary | ICD-10-CM

## 2022-01-15 DIAGNOSIS — D509 Iron deficiency anemia, unspecified: Secondary | ICD-10-CM | POA: Diagnosis not present

## 2022-01-15 DIAGNOSIS — R142 Eructation: Secondary | ICD-10-CM

## 2022-01-15 DIAGNOSIS — Z7984 Long term (current) use of oral hypoglycemic drugs: Secondary | ICD-10-CM | POA: Diagnosis not present

## 2022-01-15 DIAGNOSIS — I110 Hypertensive heart disease with heart failure: Secondary | ICD-10-CM

## 2022-01-15 DIAGNOSIS — I5032 Chronic diastolic (congestive) heart failure: Secondary | ICD-10-CM

## 2022-01-15 DIAGNOSIS — E785 Hyperlipidemia, unspecified: Secondary | ICD-10-CM | POA: Diagnosis not present

## 2022-01-15 DIAGNOSIS — E119 Type 2 diabetes mellitus without complications: Secondary | ICD-10-CM | POA: Diagnosis not present

## 2022-01-15 DIAGNOSIS — Z87891 Personal history of nicotine dependence: Secondary | ICD-10-CM | POA: Diagnosis not present

## 2022-01-15 DIAGNOSIS — Z1159 Encounter for screening for other viral diseases: Secondary | ICD-10-CM | POA: Diagnosis not present

## 2022-01-15 DIAGNOSIS — K219 Gastro-esophageal reflux disease without esophagitis: Secondary | ICD-10-CM | POA: Insufficient documentation

## 2022-01-15 DIAGNOSIS — I10 Essential (primary) hypertension: Secondary | ICD-10-CM

## 2022-01-15 LAB — GLUCOSE, CAPILLARY: Glucose-Capillary: 141 mg/dL — ABNORMAL HIGH (ref 70–99)

## 2022-01-15 LAB — POCT GLYCOSYLATED HEMOGLOBIN (HGB A1C): Hemoglobin A1C: 7.8 % — AB (ref 4.0–5.6)

## 2022-01-15 MED ORDER — OZEMPIC (2 MG/DOSE) 8 MG/3ML ~~LOC~~ SOPN: 0.5000 mg | PEN_INJECTOR | SUBCUTANEOUS | 2 refills | Status: DC

## 2022-01-15 NOTE — Patient Instructions (Addendum)
Tina Lara,  It was a pleasure to care for you today.  I am getting lab work today to check your kidney function and electrolytes. I will call you with these results.  I would like you to start taking Ozempic 0.5 mg injection once weekly for your diabetes.  Please plan to return to clinic at the end of this week or first thing next week so that we can make sure your swelling and breathing are doing better.  If you need [place] in the meantime, don't hesitate to call.  My best, Dr. Marlou Sa

## 2022-01-15 NOTE — Assessment & Plan Note (Signed)
Hepatitis C screening obtained today.

## 2022-01-15 NOTE — Assessment & Plan Note (Signed)
Patient presents today with the complaint of increased bilateral foot and leg edema with tingling and shortness of breath over the last 2 weeks. She has swelling at baseline. Now she has a tightness sensation in her lower extremities and she can't wear her regular shoes. She takes Lasix 40 mg PRN, typically every other day or so, which gives her muscle cramps any time she takes it. She notes that it works when she takes it as she has increased urination but the lower extremity edema remains.   She has had shortness of breath in this time as well. This does not occur every day and she notes it especially when she initially wakes up in the morning, with improvement of her symptoms as she gets moving each day. She has felt like she cannot lay flat to sleep at night due to the sensation. She is unable to ambulate significant distances due to her dyspnea. She feels like she may have wheezing but is unsure. Her weight in clinic is up roughly 7 lbs since June, and she admits her weight at home is up roughly 6 pounds in 2 weeks. She did recently travel to [place] by plane and says she "ate well," and isn't surprised that she has had weight gain. She does not have leg pain, difference in size of her legs, chest pain, palpitations.  She has not taken [place] in several months due to reported rash that came with starting the medication. Assessment:I do not suspect PE as cause of her dyspnea. Given her clinical presentation I suspect she is experiencing an exacerbation of her heart failure but that she can be managed outpatient. Plan:Will check BMP to assess electrolytes and, if low or low-normal, will supplement potassium with daily Lasix and have her return to clinic at the end of this week or first thing next week for recheck of her edema and dyspnea. Can also consider starting spironolactone for better control of her BP and assistance with maintaining normal K levels. She has not had an echocardiogram since 2018 so I  have ordered that today.

## 2022-01-15 NOTE — Progress Notes (Signed)
CC: bilateral LE swelling, dyspnea, belching  HPI:  Tina Lara is a 66 y.o. person with past medical history as detailed below who presents with bilateral LE swelling and dyspnea. She also has new onset belching. Please see problem based charting for detailed assessment and plan.  Past Medical History:  Diagnosis Date   Anxiety    Cataract    NS OD   Chest pain 94/76/5465   Complication of anesthesia    Per patient "needs a lot of anesthesia w/surgery" ,I had toruble with a spinal block not taking "   Diabetes mellitus without complication (HCC)    Type 2   Diabetic retinopathy ([place])    BDR   Full dentures    GERD (gastroesophageal reflux disease)    History of surgery on arm    right arm broken   Hyperlipidemia    Hypertension    never diagnosed by physician per pt   Macular degeneration    Obesity    Review of Systems:  Negative unless otherwise stated.  Physical Exam:  Vitals:   01/15/22 0842 01/15/22 0928  BP: (!) 142/77 (!) 142/74  Pulse: 83 86  Temp: 97.9 F (36.6 C)   TempSrc: Oral   SpO2: 98%   Weight: (!) 367 lb 3.2 oz (166.6 kg)   Height: '5\' 9"'$  (1.753 m)    Constitutional:Seated comfortably. In no acute distress. Cardio:Regular rate and rhythm. No murmurs, rubs, or gallops. No JVD appreciated. Pulm:Clear to auscultation bilaterally. Normal work of breathing on room air. Abdomen:Soft, nontender, nondistended. MSK: 2+ pitting edema of bilateral LE, at least to the knee. No difference in circumference between bilateral LE. LE are nontender. Skin:Warm and dry. Neuro:Alert and oriented x3. No focal deficit noted. Psych:Pleasant mood and affect.   Assessment & Plan:   See Encounters Tab for problem based charting.  Health care maintenance Patient declined tetanus booster and flu vaccine today.   HLD (hyperlipidemia) LDL last checked 08/2021 at goal, 72. Current regimen is atorvastatin 40 mg daily. Plan:Continue current  regimen.  Chronic diastolic (congestive) heart failure (HCC) Patient presents today with the complaint of increased bilateral foot and leg edema with tingling and shortness of breath over the last 2 weeks. She has swelling at baseline. Now she has a tightness sensation in her lower extremities and she can't wear her regular shoes. She takes Lasix 40 mg PRN, typically every other day or so, which gives her muscle cramps any time she takes it. She notes that it works when she takes it as she has increased urination but the lower extremity edema remains.   She has had shortness of breath in this time as well. This does not occur every day and she notes it especially when she initially wakes up in the morning, with improvement of her symptoms as she gets moving each day. She has felt like she cannot lay flat to sleep at night due to the sensation. She is unable to ambulate significant distances due to her dyspnea. She feels like she may have wheezing but is unsure. Her weight in clinic is up roughly 7 lbs since June, and she admits her weight at home is up roughly 6 pounds in 2 weeks. She did recently travel to [place] by plane and says she "ate well," and isn't surprised that she has had weight gain. She does not have leg pain, difference in size of her legs, chest pain, palpitations.  She has not taken [place] in several months due  to reported rash that came with starting the medication. Assessment:I do not suspect PE as cause of her dyspnea. Given her clinical presentation I suspect she is experiencing an exacerbation of her heart failure but that she can be managed outpatient. Plan:Will check BMP to assess electrolytes and, if low or low-normal, will supplement potassium with daily Lasix and have her return to clinic at the end of this week or first thing next week for recheck of her edema and dyspnea. Can also consider starting spironolactone for better control of her BP and assistance with maintaining  normal K levels. She has not had an echocardiogram since 2018 so I have ordered that today.  HTN (hypertension) BP above goal, 142/77 with recheck of 142/74. Current regimen is lisinopril 10 mg daily which she endorses taking this morning. Plan:Consider adding spironolactone once BMP results. Continue current regimen for now. See separate assessment and plan under "Chronic diastolic (congestive) heart failure" for further details from today's visit.  Diabetes mellitus without complication (HCC) MMN8T 7.8% today, from 7.5% in 08/2021. Patient has not been taking [place] for several months due to reported rash, which was her only diabetes medication. She has not tolerated metformin in the past due to GI upset. Assessment:Given comorbid CHF, patient would benefit from either an SGLT-2 inhibitor or GLP-1 receptor agonist. She is willing to do weekly injections if her insurance will cover the medication. Plan: Will start Ozempic 0.5 mg injection weekly with plans to titrate up as tolerated. She is aware that she is due for annual eye exam.  Need for hepatitis C screening test Hepatitis C screening obtained today.  Belching Patient endorses new onset belching that has made it impossible to sleep over the last 1 day. She does not have this problem at baseline. She denies late dinner or meal, but did have a jello cup and water around 10 PM.  Assessment:Due to concern of atypical presentation of MI, an EKG was obtained which showed possible L atrial enlargement and possible delta waves. No evidence of ischemia or infarction.  Plan:Will reassess at further visits if this problem persists.  Abnormal Pap smear of cervix Patient reports follow up with with OB/Gyn for this concern. Will need records to confirm.  Iron deficiency anemia Patient with history of iron deficiency anemia on ferrous sulfate 325 mg daily complains of persistent pica.  Plan:Check CBC today.  Patient discussed with Dr. Philipp Ovens

## 2022-01-15 NOTE — Assessment & Plan Note (Signed)
Patient reports follow up with with OB/Gyn for this concern. Will need records to confirm.

## 2022-01-15 NOTE — Assessment & Plan Note (Addendum)
Patient endorses new onset belching that has made it impossible to sleep over the last 1 day. She does not have this problem at baseline. She denies late dinner or meal, but did have a jello cup and water around 10 PM.  Assessment:Due to concern of atypical presentation of MI, an EKG was obtained which showed possible L atrial enlargement and possible delta waves. No evidence of ischemia or infarction.  Plan:Will reassess at further visits if this problem persists.

## 2022-01-15 NOTE — Progress Notes (Signed)
Internal Medicine Clinic Attending ? ?Case discussed with Dr. Dean  At the time of the visit.  We reviewed the resident?s history and exam and pertinent patient test results.  I agree with the assessment, diagnosis, and plan of care documented in the resident?s note.  ?

## 2022-01-15 NOTE — Assessment & Plan Note (Signed)
BP above goal, 142/77 with recheck of 142/74. Current regimen is lisinopril 10 mg daily which she endorses taking this morning. Plan:Consider adding spironolactone once BMP results. Continue current regimen for now. See separate assessment and plan under "Chronic diastolic (congestive) heart failure" for further details from today's visit.

## 2022-01-15 NOTE — Assessment & Plan Note (Signed)
Patient with history of iron deficiency anemia on ferrous sulfate 325 mg daily complains of persistent pica.  Plan:Check CBC today.

## 2022-01-15 NOTE — Assessment & Plan Note (Addendum)
Patient declined tetanus booster and flu vaccine today.

## 2022-01-15 NOTE — Assessment & Plan Note (Signed)
LDL last checked 08/2021 at goal, 72. Current regimen is atorvastatin 40 mg daily. Plan:Continue current regimen.

## 2022-01-15 NOTE — Assessment & Plan Note (Addendum)
HbA1c 7.8% today, from 7.5% in 08/2021. Patient has not been taking [place] for several months due to reported rash, which was her only diabetes medication. She has not tolerated metformin in the past due to GI upset. Assessment:Given comorbid CHF, patient would benefit from either an SGLT-2 inhibitor or GLP-1 receptor agonist. She is willing to do weekly injections if her insurance will cover the medication. Plan: Will start Ozempic 0.5 mg injection weekly with plans to titrate up as tolerated. She is aware that she is due for annual eye exam.

## 2022-01-16 LAB — CBC
Hematocrit: 34.3 % (ref 34.0–46.6)
Hemoglobin: 10.3 g/dL — ABNORMAL LOW (ref 11.1–15.9)
MCH: 23.3 pg — ABNORMAL LOW (ref 26.6–33.0)
MCHC: 30 g/dL — ABNORMAL LOW (ref 31.5–35.7)
MCV: 78 fL — ABNORMAL LOW (ref 79–97)
Platelets: 239 10*3/uL (ref 150–450)
RBC: 4.42 x10E6/uL (ref 3.77–5.28)
RDW: 14.6 % (ref 11.7–15.4)
WBC: 3.3 10*3/uL — ABNORMAL LOW (ref 3.4–10.8)

## 2022-01-16 LAB — BMP8+ANION GAP
Anion Gap: 16 mmol/L (ref 10.0–18.0)
BUN/Creatinine Ratio: 13 (ref 12–28)
BUN: 16 mg/dL (ref 8–27)
CO2: 22 mmol/L (ref 20–29)
Calcium: 9.1 mg/dL (ref 8.7–10.3)
Chloride: 101 mmol/L (ref 96–106)
Creatinine, Ser: 1.28 mg/dL — ABNORMAL HIGH (ref 0.57–1.00)
Glucose: 143 mg/dL — ABNORMAL HIGH (ref 70–99)
Potassium: 4.1 mmol/L (ref 3.5–5.2)
Sodium: 139 mmol/L (ref 134–144)
eGFR: 47 mL/min/{1.73_m2} — ABNORMAL LOW (ref >59)

## 2022-01-16 LAB — HCV INTERPRETATION

## 2022-01-16 LAB — HCV AB W REFLEX TO QUANT PCR: HCV Ab: NONREACTIVE

## 2022-01-17 ENCOUNTER — Telehealth: Payer: Self-pay

## 2022-01-17 NOTE — Telephone Encounter (Signed)
I attempted  to contact Tina Lara but she did not answer; I left a VM requesting a call back. Thanks [name]!

## 2022-01-17 NOTE — Telephone Encounter (Signed)
Pt is requesting a call back .Marland Kitchen She stated that Dr Marlou Sa was to be giving her a call back in regards  to her med and her results .Marland Kitchen She also stated that she is still having SOB

## 2022-01-22 ENCOUNTER — Telehealth: Payer: Self-pay

## 2022-01-22 DIAGNOSIS — E119 Type 2 diabetes mellitus without complications: Secondary | ICD-10-CM

## 2022-01-22 MED ORDER — SEMAGLUTIDE(0.25 OR 0.5MG/DOS) 2 MG/3ML ~~LOC~~ SOPN: 0.2500 mg | PEN_INJECTOR | SUBCUTANEOUS | 2 refills | Status: DC

## 2022-01-22 NOTE — Telephone Encounter (Signed)
Sent new Rx for the low dose 0.25 mg pens.

## 2022-01-22 NOTE — Telephone Encounter (Signed)
Incoming fax from pharmacy "Rx cannot be given as 0.'25mg'$  dose; need new script"

## 2022-01-24 ENCOUNTER — Encounter: Payer: Medicare Other | Admitting: Internal Medicine

## 2022-01-25 ENCOUNTER — Encounter: Payer: Self-pay | Admitting: Internal Medicine

## 2022-01-28 ENCOUNTER — Other Ambulatory Visit: Payer: Self-pay

## 2022-01-28 ENCOUNTER — Emergency Department (HOSPITAL_BASED_OUTPATIENT_CLINIC_OR_DEPARTMENT_OTHER): Payer: Medicare Other

## 2022-01-28 ENCOUNTER — Emergency Department (HOSPITAL_BASED_OUTPATIENT_CLINIC_OR_DEPARTMENT_OTHER)
Admission: EM | Admit: 2022-01-28 | Discharge: 2022-01-28 | Disposition: A | Discharge status: Home / Self Care | Payer: Medicare Other | Attending: Emergency Medicine | Admitting: Emergency Medicine

## 2022-01-28 ENCOUNTER — Encounter (HOSPITAL_BASED_OUTPATIENT_CLINIC_OR_DEPARTMENT_OTHER): Payer: Self-pay | Admitting: Emergency Medicine

## 2022-01-28 DIAGNOSIS — R0602 Shortness of breath: Secondary | ICD-10-CM | POA: Diagnosis not present

## 2022-01-28 DIAGNOSIS — R1084 Generalized abdominal pain: Secondary | ICD-10-CM | POA: Diagnosis not present

## 2022-01-28 DIAGNOSIS — Z79899 Other long term (current) drug therapy: Secondary | ICD-10-CM | POA: Insufficient documentation

## 2022-01-28 DIAGNOSIS — E119 Type 2 diabetes mellitus without complications: Secondary | ICD-10-CM | POA: Diagnosis not present

## 2022-01-28 DIAGNOSIS — R059 Cough, unspecified: Secondary | ICD-10-CM | POA: Diagnosis not present

## 2022-01-28 DIAGNOSIS — D72819 Decreased white blood cell count, unspecified: Secondary | ICD-10-CM | POA: Diagnosis not present

## 2022-01-28 DIAGNOSIS — D649 Anemia, unspecified: Secondary | ICD-10-CM | POA: Diagnosis not present

## 2022-01-28 DIAGNOSIS — Z1152 Encounter for screening for COVID-19: Secondary | ICD-10-CM | POA: Insufficient documentation

## 2022-01-28 DIAGNOSIS — I509 Heart failure, unspecified: Secondary | ICD-10-CM | POA: Diagnosis not present

## 2022-01-28 DIAGNOSIS — Z7982 Long term (current) use of aspirin: Secondary | ICD-10-CM | POA: Insufficient documentation

## 2022-01-28 DIAGNOSIS — E8770 Fluid overload, unspecified: Secondary | ICD-10-CM

## 2022-01-28 DIAGNOSIS — I5033 Acute on chronic diastolic (congestive) heart failure: Secondary | ICD-10-CM | POA: Diagnosis not present

## 2022-01-28 DIAGNOSIS — K5730 Diverticulosis of large intestine without perforation or abscess without bleeding: Secondary | ICD-10-CM | POA: Diagnosis not present

## 2022-01-28 DIAGNOSIS — I110 Hypertensive heart disease with heart failure: Secondary | ICD-10-CM | POA: Diagnosis not present

## 2022-01-28 DIAGNOSIS — Z7984 Long term (current) use of oral hypoglycemic drugs: Secondary | ICD-10-CM | POA: Diagnosis not present

## 2022-01-28 HISTORY — DX: Heart failure, unspecified: I50.9

## 2022-01-28 LAB — CBC WITH DIFFERENTIAL/PLATELET
Abs Immature Granulocytes: 0.01 10*3/uL (ref 0.00–0.07)
Basophils Absolute: 0 10*3/uL (ref 0.0–0.1)
Basophils Relative: 1 %
Eosinophils Absolute: 0.1 10*3/uL (ref 0.0–0.5)
Eosinophils Relative: 4 %
HCT: 36.5 % (ref 36.0–46.0)
Hemoglobin: 10.4 g/dL — ABNORMAL LOW (ref 12.0–15.0)
Immature Granulocytes: 0 %
Lymphocytes Relative: 16 %
Lymphs Abs: 0.5 10*3/uL — ABNORMAL LOW (ref 0.7–4.0)
MCH: 23.7 pg — ABNORMAL LOW (ref 26.0–34.0)
MCHC: 28.5 g/dL — ABNORMAL LOW (ref 30.0–36.0)
MCV: 83.1 fL (ref 80.0–100.0)
Monocytes Absolute: 0.5 10*3/uL (ref 0.1–1.0)
Monocytes Relative: 15 %
Neutro Abs: 2.1 10*3/uL (ref 1.7–7.7)
Neutrophils Relative %: 64 %
Platelets: 208 10*3/uL (ref 150–400)
RBC: 4.39 MIL/uL (ref 3.87–5.11)
RDW: 16.5 % — ABNORMAL HIGH (ref 11.5–15.5)
WBC: 3.2 10*3/uL — ABNORMAL LOW (ref 4.0–10.5)
nRBC: 0 % (ref 0.0–0.2)

## 2022-01-28 LAB — COMPREHENSIVE METABOLIC PANEL
ALT: 19 U/L (ref 0–44)
AST: 24 U/L (ref 15–41)
Albumin: 3.4 g/dL — ABNORMAL LOW (ref 3.5–5.0)
Alkaline Phosphatase: 115 U/L (ref 38–126)
Anion gap: 6 (ref 5–15)
BUN: 22 mg/dL (ref 8–23)
CO2: 24 mmol/L (ref 22–32)
Calcium: 8.4 mg/dL — ABNORMAL LOW (ref 8.9–10.3)
Chloride: 107 mmol/L (ref 98–111)
Creatinine, Ser: 1.39 mg/dL — ABNORMAL HIGH (ref 0.44–1.00)
GFR, Estimated: 43 mL/min — ABNORMAL LOW (ref >60)
Glucose, Bld: 167 mg/dL — ABNORMAL HIGH (ref 70–99)
Potassium: 4.2 mmol/L (ref 3.5–5.1)
Sodium: 137 mmol/L (ref 135–145)
Total Bilirubin: 1.3 mg/dL — ABNORMAL HIGH (ref 0.3–1.2)
Total Protein: 8 g/dL (ref 6.5–8.1)

## 2022-01-28 LAB — SARS CORONAVIRUS 2 BY RT PCR: SARS Coronavirus 2 by RT PCR: NEGATIVE

## 2022-01-28 LAB — BRAIN NATRIURETIC PEPTIDE: B Natriuretic Peptide: 1031.1 pg/mL — ABNORMAL HIGH (ref 0.0–100.0)

## 2022-01-28 LAB — LIPASE, BLOOD: Lipase: 35 U/L (ref 11–51)

## 2022-01-28 LAB — TROPONIN I (HIGH SENSITIVITY)
Troponin I (High Sensitivity): 17 ng/L (ref <18)
Troponin I (High Sensitivity): 17 ng/L (ref <18)

## 2022-01-28 MED ORDER — DICYCLOMINE HCL 10 MG/ML IM SOLN
20.0000 mg | Freq: Once | INTRAMUSCULAR | Status: DC
  Filled 2022-01-28: qty 2

## 2022-01-28 MED ORDER — DICYCLOMINE HCL 20 MG PO TABS: 20.0000 mg | ORAL_TABLET | Freq: Two times a day (BID) | ORAL | 0 refills | Status: DC | PRN

## 2022-01-28 MED ORDER — PANTOPRAZOLE SODIUM 20 MG PO TBEC: 40.0000 mg | DELAYED_RELEASE_TABLET | Freq: Every day | ORAL | 0 refills | Status: DC

## 2022-01-28 MED ORDER — IOHEXOL 350 MG/ML SOLN
80.0000 mL | Freq: Once | INTRAVENOUS | Status: AC | PRN
  Administered 2022-01-28: 80 mL via INTRAVENOUS

## 2022-01-28 MED ORDER — FUROSEMIDE 10 MG/ML IJ SOLN
40.0000 mg | Freq: Once | INTRAMUSCULAR | Status: AC
  Administered 2022-01-28: 40 mg via INTRAVENOUS
  Filled 2022-01-28: qty 4

## 2022-01-28 NOTE — ED Triage Notes (Signed)
Pt reports shortness of breath. Pt speaking in short sentences. Has also had a dry cough at home.  Also having generalized abd pain and belching. No n/v/d.

## 2022-01-28 NOTE — Discharge Instructions (Signed)
Tonight, take '40mg'$  of lasix, tomorrow and the next day take '60mg'$  of lasix.

## 2022-01-28 NOTE — ED Provider Notes (Signed)
[facility] EMERGENCY DEPARTMENT Provider Note   CSN: [id number] Arrival date & time: 01/28/22  0700     History {Add pertinent medical, surgical, social history, OB history to HPI:1} Chief Complaint  Patient presents with   Shortness of Breath   Abdominal Pain    Tina Lara is a 66 y.o. female.  HPI     66 year old female with a history of chronic diastolic congestive heart failure, hypertension, diabetes, iron deficiency anemia, who presents with concern for shortness of breath, orthopnea, and abdominal discomfort.   For about 1 week has had worsening shortness of breath, more severe starting Friday, cannot sleep due to dyspnea and orthopnea Leg swelling 10/16 saw PCP, taking lasix now every day (initially was prn) Dyspnea comes and goes in spells Out of town and walking a lot last week, went to [place] in [place] Abdominal discomfort, feeling of tightness and warmth, frequent belching, this past week is when it really started, was going on a little before No fever, nausea, vomiting, diarrhea Did take enema yesterday for constipation, had a little BM after that, and has low appetite  Dry mouth and dry cough, Friday had nose running and took alka seltzer, no chest pain Got ozempic Friday but has not taken it.  Past Medical History:  Diagnosis Date   Anxiety    Cataract    NS OD   Chest pain 69/67/8938   Complication of anesthesia    Per patient "needs a lot of anesthesia w/surgery" ,I had toruble with a spinal block not taking "   Diabetes mellitus without complication (HCC)    Type 2   Diabetic retinopathy ([place])    BDR   Full dentures    GERD (gastroesophageal reflux disease)    History of surgery on arm    right arm broken   Hyperlipidemia    Hypertension    never diagnosed by physician per pt   Macular degeneration    Obesity      Home Medications Prior to Admission medications   Medication Sig Start Date End Date Taking? Authorizing Provider   acetaminophen (TYLENOL) 500 MG tablet Take 1,500 mg by mouth every 6 (six) hours as needed for moderate pain or headache.     [provider]  aspirin 81 MG EC tablet Take 1 tablet (81 mg total) by mouth daily. Patient taking differently: Take 81 mg by mouth every other day. 08/16/16   Shela Leff, MD  atorvastatin (LIPITOR) 40 MG tablet Take 1 tablet (40 mg total) by mouth daily. 09/06/21 09/06/22  Orvis Brill, MD  dapagliflozin propanediol (FARXIGA) 10 MG TABS tablet Take 1 tablet (10 mg total) by mouth daily before breakfast. 09/06/21   Orvis Brill, MD  ferrous sulfate 325 (65 FE) MG tablet Take 1 tablet (325 mg total) by mouth daily. 09/06/21 01/04/22  Orvis Brill, MD  furosemide (LASIX) 40 MG tablet Take 1 tablet (40 mg total) by mouth daily as needed. Take by mouth daily as needed (swelling or shortness of breath). 09/06/21 09/06/22  Orvis Brill, MD  Hypromellose (ARTIFICIAL TEARS OP) Place 1 drop into both eyes daily as needed (for dry eyes).    [provider]  ibuprofen (ADVIL,MOTRIN) 200 MG tablet Take 400 mg by mouth every 6 (six) hours as needed for headache or moderate pain.     [provider]  lisinopril (ZESTRIL) 10 MG tablet Take 1 tablet (10 mg total) by mouth daily. 09/06/21   Lorin Glass,  Eliseo Squires, MD  Menthol, Topical Analgesic, (BIOFREEZE EX) Apply 1 application topically daily as needed (back pain).    [provider]  metFORMIN (GLUCOPHAGE-XR) 500 MG 24 hr tablet Take 1 tablet (500 mg total) by mouth daily with breakfast. 08/24/20   Velna Ochs, MD  Semaglutide,0.25 or 0.'5MG'$ /DOS, 2 MG/3ML SOPN Inject 0.25 mg into the skin once a week. 01/22/22   Velna Ochs, MD      Allergies    Percocet [oxycodone-acetaminophen]    Review of Systems   Review of Systems  Physical Exam Updated Vital Signs BP 137/86   Pulse 95   Temp 97.8 F (36.6 C) (Oral)   Resp 17   SpO2 100%  Physical Exam  ED Results /  Procedures / Treatments   Labs (all labs ordered are listed, but only abnormal results are displayed) Labs Reviewed  CBC WITH DIFFERENTIAL/PLATELET  COMPREHENSIVE METABOLIC PANEL  BRAIN NATRIURETIC PEPTIDE  LIPASE, BLOOD  TROPONIN I (HIGH SENSITIVITY)    EKG None  Radiology No results found.  Procedures Procedures  {Document cardiac monitor, telemetry assessment procedure when appropriate:1}  Medications Ordered in ED Medications - No data to display  ED Course/ Medical Decision Making/ A&P                           Medical Decision Making Amount and/or Complexity of Data Reviewed Labs: ordered. Radiology: ordered.  Risk Prescription drug management.   66 year old female with a history of chronic diastolic congestive heart failure, hypertension, diabetes, iron deficiency anemia, who presents with concern for shortness of breath, orthopnea, and abdominal discomfort.  Differential diagnosis for dyspnea includes ACS, PE, COPD exacerbation, CHF exacerbation, anemia, pneumonia, viral etiology such as COVID 19 infection, metabolic abnormality.  Chest x-ray was done and personally evaluated and interpreted by me which showed cardiomegaly, no edema or pneumonia. EKG was evaluated by me which showed no acute changes.  Troponin negative x2 and doubt ACS.   Labs completed and personally brought interpreted by me show mild anemia, leukopenia, negative COVID testing, creatinine mildly increased from prior, troponin within normal limits, lipase within normal limits.  BNP is elevated, however no evidence of pulmonary edema on chest x-ray  Given recent travel, tachycardia, clear chest x-ray with shortness of breath  {Document critical care time when appropriate:1} {Document review of labs and clinical decision tools ie heart score, Chads2Vasc2 etc:1}  {Document your independent review of radiology images, and any outside records:1} {Document your discussion with family members,  caretakers, and with consultants:1} {Document social determinants of health affecting pt's care:1} {Document your decision making why or why not admission, treatments were needed:1} Final Clinical Impression(s) / ED Diagnoses Final diagnoses:  None    Rx / DC Orders ED Discharge Orders     None

## 2022-01-29 ENCOUNTER — Telehealth: Payer: Self-pay

## 2022-01-29 NOTE — Patient Outreach (Signed)
  Care Coordination TOC Note Transition Care Management Follow-up Telephone Call Date of discharge and from where: [facility] ED 01/28/22 How have you been since you were released from the hospital? "I am still not feeling great, but am slowly getting better" Any questions or concerns? Yes- Patient scheduled for EKG on 02/07/22, ordered by her PCP and she wants to know if she can cancel that appointment.  Message to be sent to PCP  Items Reviewed: Did the pt receive and understand the discharge instructions provided? Yes  Medications obtained and verified? Yes  Other? No  Any new allergies since your discharge? No  Dietary orders reviewed? No Do you have support at home? Yes   Home Care and Equipment/Supplies: Were home health services ordered? no If so, what is the name of the agency? N/a  Has the agency set up a time to come to the patient's home? not applicable Were any new equipment or medical supplies ordered?  No What is the name of the medical supply agency? N/a Were you able to get the supplies/equipment? not applicable Do you have any questions related to the use of the equipment or supplies? No  Functional Questionnaire: (I = Independent and D = Dependent) ADLs: I  Bathing/Dressing- I  Meal Prep- I  Eating- I  Maintaining continence- I  Transferring/Ambulation- I  Managing Meds- I  Follow up appointments reviewed:  PCP Hospital f/u appt confirmed? No  Specialist Hospital f/u appt confirmed? No   Are transportation arrangements needed? No  If their condition worsens, is the pt aware to call PCP or go to the Emergency Dept.? Yes Was the patient provided with contact information for the PCP's office or ED? Yes Was to pt encouraged to call back with questions or concerns? Yes  SDOH assessments and interventions completed:   Yes  Care Coordination Interventions Activated:  Yes   Care Coordination Interventions:  PCP follow up appointment requested    Encounter Outcome:  Pt. Visit Completed

## 2022-02-07 ENCOUNTER — Ambulatory Visit (INDEPENDENT_AMBULATORY_CARE_PROVIDER_SITE_OTHER): Payer: Medicare Other | Admitting: Internal Medicine

## 2022-02-07 ENCOUNTER — Ambulatory Visit (HOSPITAL_COMMUNITY)
Admission: RE | Admit: 2022-02-07 | Discharge: 2022-02-07 | Disposition: A | Discharge status: Home / Self Care | Payer: Medicare Other | Source: Ambulatory Visit | Attending: Internal Medicine | Admitting: Internal Medicine

## 2022-02-07 ENCOUNTER — Encounter: Payer: Self-pay | Admitting: Internal Medicine

## 2022-02-07 ENCOUNTER — Other Ambulatory Visit: Payer: Self-pay

## 2022-02-07 VITALS — BP 133/84 | HR 82 | Temp 97.7°F | Ht 69.0 in | Wt 365.2 lb

## 2022-02-07 DIAGNOSIS — I5033 Acute on chronic diastolic (congestive) heart failure: Secondary | ICD-10-CM

## 2022-02-07 DIAGNOSIS — I082 Rheumatic disorders of both aortic and tricuspid valves: Secondary | ICD-10-CM | POA: Insufficient documentation

## 2022-02-07 DIAGNOSIS — Z87891 Personal history of nicotine dependence: Secondary | ICD-10-CM | POA: Diagnosis not present

## 2022-02-07 DIAGNOSIS — E669 Obesity, unspecified: Secondary | ICD-10-CM | POA: Diagnosis not present

## 2022-02-07 DIAGNOSIS — R188 Other ascites: Secondary | ICD-10-CM | POA: Diagnosis not present

## 2022-02-07 DIAGNOSIS — K5900 Constipation, unspecified: Secondary | ICD-10-CM | POA: Insufficient documentation

## 2022-02-07 DIAGNOSIS — Z7984 Long term (current) use of oral hypoglycemic drugs: Secondary | ICD-10-CM

## 2022-02-07 DIAGNOSIS — R1084 Generalized abdominal pain: Secondary | ICD-10-CM

## 2022-02-07 DIAGNOSIS — K219 Gastro-esophageal reflux disease without esophagitis: Secondary | ICD-10-CM | POA: Diagnosis not present

## 2022-02-07 DIAGNOSIS — E119 Type 2 diabetes mellitus without complications: Secondary | ICD-10-CM | POA: Insufficient documentation

## 2022-02-07 DIAGNOSIS — D500 Iron deficiency anemia secondary to blood loss (chronic): Secondary | ICD-10-CM | POA: Diagnosis not present

## 2022-02-07 DIAGNOSIS — E785 Hyperlipidemia, unspecified: Secondary | ICD-10-CM | POA: Diagnosis not present

## 2022-02-07 DIAGNOSIS — K7460 Unspecified cirrhosis of liver: Secondary | ICD-10-CM | POA: Diagnosis not present

## 2022-02-07 DIAGNOSIS — I5032 Chronic diastolic (congestive) heart failure: Secondary | ICD-10-CM | POA: Diagnosis not present

## 2022-02-07 DIAGNOSIS — I110 Hypertensive heart disease with heart failure: Secondary | ICD-10-CM | POA: Insufficient documentation

## 2022-02-07 LAB — ECHOCARDIOGRAM COMPLETE
Area-P 1/2: 5.75 cm2
Calc EF: 32.5 %
MV VTI: 2.46 cm2
P 1/2 time: 314 msec
S' Lateral: 4.6 cm
Single Plane A2C EF: 35.4 %
Single Plane A4C EF: 25.5 %

## 2022-02-07 LAB — PROTIME-INR
INR: 1.3 — ABNORMAL HIGH (ref 0.8–1.2)
Prothrombin Time: 15.8 seconds — ABNORMAL HIGH (ref 11.4–15.2)

## 2022-02-07 MED ORDER — DICYCLOMINE HCL 20 MG PO TABS: 20.0000 mg | ORAL_TABLET | Freq: Two times a day (BID) | ORAL | 0 refills | Status: DC | PRN

## 2022-02-07 MED ORDER — LACTULOSE 10 GM/15ML PO SOLN: 10.0000 g | Freq: Two times a day (BID) | ORAL | 0 refills | Status: DC | PRN

## 2022-02-07 MED ORDER — PERFLUTREN LIPID MICROSPHERE
1.0000 mL | INTRAVENOUS | Status: AC | PRN
  Administered 2022-02-07: 2 mL via INTRAVENOUS
  Filled 2022-02-07: qty 10

## 2022-02-07 MED ORDER — PANTOPRAZOLE SODIUM 40 MG PO TBEC: 40.0000 mg | DELAYED_RELEASE_TABLET | Freq: Every day | ORAL | 2 refills | Status: DC

## 2022-02-07 NOTE — Assessment & Plan Note (Signed)
Hemoglobin A1c checked earlier this month was 7.8.  She was recently started on semaglutide, currently on the starting dose.  She discontinued metformin due to concerns for GI side effects with her abdominal pain and constipation.  Plan to continue with semaglutide alone for now, will uptitrate this at future visit once her acute abdominal symptoms hopefully resolved.

## 2022-02-07 NOTE — Progress Notes (Signed)
Subjective:   Patient ID: [name] female   DOB: 01-16-1956 66 y.o.   MRN: [id number]  HPI: [name] is a 66 y.o. female with past medical history outlined below here for CHF exacerbation / ED follow up. For the details of today's visit, please refer to the assessment and plan.   Past Medical History:  Diagnosis Date   Anxiety    Cataract    NS OD   Chest pain 57/84/6962   Complication of anesthesia    Per patient "needs a lot of anesthesia w/surgery" ,I had toruble with a spinal block not taking "   Diabetes mellitus without complication (HCC)    Type 2   Diabetic retinopathy ([place])    BDR   Full dentures    GERD (gastroesophageal reflux disease)    Heart failure ([place])    History of surgery on arm    right arm broken   Hyperlipidemia    Hypertension    never diagnosed by physician per pt   Macular degeneration    Obesity    Current Outpatient Medications  Medication Sig Dispense Refill   acetaminophen (TYLENOL) 500 MG tablet Take 1,500 mg by mouth every 6 (six) hours as needed for moderate pain or headache.      aspirin 81 MG EC tablet Take 1 tablet (81 mg total) by mouth daily. (Patient taking differently: Take 81 mg by mouth every other day.) 30 tablet 0   atorvastatin (LIPITOR) 40 MG tablet Take 1 tablet (40 mg total) by mouth daily. 90 tablet 3   dapagliflozin propanediol (FARXIGA) 10 MG TABS tablet Take 1 tablet (10 mg total) by mouth daily before breakfast. 30 tablet 2   dicyclomine (BENTYL) 20 MG tablet Take 1 tablet (20 mg total) by mouth 2 (two) times daily as needed for spasms (abdominal pain). 20 tablet 0   ferrous sulfate 325 (65 FE) MG tablet Take 1 tablet (325 mg total) by mouth daily. 30 tablet 3   furosemide (LASIX) 40 MG tablet Take 1 tablet (40 mg total) by mouth daily as needed. Take by mouth daily as needed (swelling or shortness of breath). 30 tablet 11   Hypromellose (ARTIFICIAL TEARS OP) Place 1 drop into both eyes daily as needed  (for dry eyes).     ibuprofen (ADVIL,MOTRIN) 200 MG tablet Take 400 mg by mouth every 6 (six) hours as needed for headache or moderate pain.      lisinopril (ZESTRIL) 10 MG tablet Take 1 tablet (10 mg total) by mouth daily. 90 tablet 3   Menthol, Topical Analgesic, (BIOFREEZE EX) Apply 1 application topically daily as needed (back pain).     metFORMIN (GLUCOPHAGE-XR) 500 MG 24 hr tablet Take 1 tablet (500 mg total) by mouth daily with breakfast. 90 tablet 3   pantoprazole (PROTONIX) 20 MG tablet Take 2 tablets (40 mg total) by mouth daily for 14 days. 28 tablet 0   Semaglutide,0.25 or 0.'5MG'$ /DOS, 2 MG/3ML SOPN Inject 0.25 mg into the skin once a week. 3 mL 2   No current facility-administered medications for this visit.   Facility-Administered Medications Ordered in Other Visits  Medication Dose Route Frequency Provider Last Rate Last Admin   perflutren lipid microspheres (DEFINITY) IV suspension  1-10 mL Intravenous PRN Sid Falcon, MD   2 mL at 02/07/22 0905   Family History  Problem Relation Age of Onset   Kidney disease Mother    Heart disease Mother    Diabetes Mother  Seizures Father    Heart disease Father    Other Father    Breast cancer Neg Hx    Social History   Socioeconomic History   Marital status: Divorced    Spouse name: Not on file   Number of children: Not on file   Years of education: Not on file   Highest education level: Not on file  Occupational History   Not on file  Tobacco Use   Smoking status: Former    Packs/day: 0.25    Years: 6.00    Total pack years: 1.50    Types: Cigarettes   Smokeless tobacco: Never   Tobacco comments:    quit 36 yrs ago  Vaping Use   Vaping Use: Never used  Substance and Sexual Activity   Alcohol use: Yes    Comment: occasionally   Drug use: No   Sexual activity: Not Currently    Birth control/protection: None  Other Topics Concern   Not on file  Social History Narrative   Not on file   Social Determinants  of Health   Financial Resource Strain: Not on file  Food Insecurity: Not on file  Transportation Needs: No Transportation Needs (01/29/2022)   PRAPARE - Hydrologist (Medical): No    Lack of Transportation (Non-Medical): No  Physical Activity: Not on file  Stress: Not on file  Social Connections: Not on file     Objective:  Physical Exam:  Vitals:   02/07/22 1002  BP: 133/84  Pulse: 82  Temp: 97.7 F (36.5 C)  TempSrc: Oral  SpO2: 100%  Weight: (!) 365 lb 3.2 oz (165.7 kg)  Height: '5\' 9"'$  (1.753 m)    Constitutional: Obese, NAD Cardiovascular: Distant heart sounds, RRR Pulmonary/Chest: Poor airflow, no wheezing or rhonchi  Extremities: 1+ LE edema  Skin:  Neurological:  Psychiatric:   Assessment & Plan:   No problem-specific Assessment & Plan notes found for this encounter.

## 2022-02-07 NOTE — Assessment & Plan Note (Signed)
Patient has a history of iron deficiency anemia.  Hemoglobin checked at recent ED visit was stable near her baseline.  She reports a few episodes of dark stool.  We are currently working her up for cirrhosis and I have referred her to GI for evaluation.  Rechecking CBC and iron studies today.

## 2022-02-07 NOTE — Progress Notes (Signed)
  Echocardiogram 2D Echocardiogram has been performed.  Tina Lara 02/07/2022, 9:10 AM

## 2022-02-07 NOTE — Assessment & Plan Note (Signed)
Patient here with volume overload due to CHF exacerbation versus decompensated cirrhosis.  Recent CT abdomen and pelvis in the ED showed nodular contour of the liver with evidence of small volume ascites.  She is currently taking Lasix 40 mg daily.  I have discussed the possible diagnosis of cirrhosis and likely etiology of NASH versus cardiac cirrhosis with her and her daughters.  She had an echocardiogram performed this morning which has not yet been read.  I have ordered liver elastography, CMP, CBC, PT/INR.  I have also placed a referral to GI.  For her volume overload and decompensated state, plan to continue with Lasix 40 mg for now.  Consider adding on spironolactone pending renal function.  I will call patient tomorrow with these results.

## 2022-02-07 NOTE — Assessment & Plan Note (Signed)
Patient reports acute on chronic constipation with abdominal cramping and reflux.  I have refilled her Bentyl and Protonix which was filled by the EDP and also sent for low-dose lactulose 10 mg twice daily as needed.

## 2022-02-07 NOTE — Assessment & Plan Note (Signed)
Patient is here for ED follow-up.  Recently seen for worsening dyspnea, orthopnea, and abdominal discomfort.  Treated for a CHF exacerbation.  BNP in the ED was significantly elevated over 1000.  He also had a CTA chest abdomen and pelvis which showed evidence of abdominal wall edema and a small amount of ascites with a nodular contour of the liver concerning for cirrhosis.  She was discharged with increased Lasix dosing and close follow-up.  She took 60 mg of Lasix as instructed x3 days then resume her prior 40 mg dose.  Unfortunately she continues to have significant symptoms, does not feel she has improved.  Reports abdominal swelling, cramping, constipation, dyspnea, and PND.  She has been unable to lay flat to sleep at night.  On exam she has pitting edema of her lower extremities.  Cardiopulmonary exam is limited due to her habitus.  I am rechecking labs today and will dose her diuretics accordingly once this has resulted.  See cirrhosis AMP.

## 2022-02-07 NOTE — Patient Instructions (Signed)
Tina Lara,  It was a pleasure to see you today. I will call you with the results of your blood work tomorrow. Once this results, I will call you with the plan for your medications.   I have placed a referral for a GI doctor for your cirrohosis and ordered an ultrasound of your liver.    For your constipation, I have started you on a low dose of lactulose. You can take this up to twice a day as needed for constipation.   Follow up with me again in 2-4 weeks.   If you have any questions or concerns, call our clinic at [phone] or after hours call [phone] and ask for the internal medicine resident on call.   Thank you!  Dr. Darnell Level

## 2022-02-08 ENCOUNTER — Other Ambulatory Visit: Payer: Self-pay | Admitting: Internal Medicine

## 2022-02-08 DIAGNOSIS — I5043 Acute on chronic combined systolic (congestive) and diastolic (congestive) heart failure: Secondary | ICD-10-CM

## 2022-02-08 LAB — CMP14 + ANION GAP
ALT: 13 IU/L (ref 0–32)
AST: 22 IU/L (ref 0–40)
Albumin/Globulin Ratio: 0.9 — ABNORMAL LOW (ref 1.2–2.2)
Albumin: 3.9 g/dL (ref 3.9–4.9)
Alkaline Phosphatase: 120 IU/L (ref 44–121)
Anion Gap: 18 mmol/L (ref 10.0–18.0)
BUN/Creatinine Ratio: 16 (ref 12–28)
BUN: 22 mg/dL (ref 8–27)
Bilirubin Total: 0.8 mg/dL (ref 0.0–1.2)
CO2: 18 mmol/L — ABNORMAL LOW (ref 20–29)
Calcium: 9.3 mg/dL (ref 8.7–10.3)
Chloride: 107 mmol/L — ABNORMAL HIGH (ref 96–106)
Creatinine, Ser: 1.35 mg/dL — ABNORMAL HIGH (ref 0.57–1.00)
Globulin, Total: 4.5 g/dL (ref 1.5–4.5)
Glucose: 146 mg/dL — ABNORMAL HIGH (ref 70–99)
Potassium: 4.2 mmol/L (ref 3.5–5.2)
Sodium: 143 mmol/L (ref 134–144)
Total Protein: 8.4 g/dL (ref 6.0–8.5)
eGFR: 44 mL/min/{1.73_m2} — ABNORMAL LOW (ref >59)

## 2022-02-08 LAB — IRON,TIBC AND FERRITIN PANEL
Ferritin: 74 ng/mL (ref 15–150)
Iron Saturation: 7 % — CL (ref 15–55)
Iron: 27 ug/dL (ref 27–139)
Total Iron Binding Capacity: 414 ug/dL (ref 250–450)
UIBC: 387 ug/dL — ABNORMAL HIGH (ref 118–369)

## 2022-02-08 LAB — CBC
Hematocrit: 35.7 % (ref 34.0–46.6)
Hemoglobin: 10.7 g/dL — ABNORMAL LOW (ref 11.1–15.9)
MCH: 23.4 pg — ABNORMAL LOW (ref 26.6–33.0)
MCHC: 30 g/dL — ABNORMAL LOW (ref 31.5–35.7)
MCV: 78 fL — ABNORMAL LOW (ref 79–97)
Platelets: 211 10*3/uL (ref 150–450)
RBC: 4.57 x10E6/uL (ref 3.77–5.28)
RDW: 15 % (ref 11.7–15.4)
WBC: 3.1 10*3/uL — ABNORMAL LOW (ref 3.4–10.8)

## 2022-02-08 MED ORDER — FUROSEMIDE 80 MG PO TABS: 80.0000 mg | ORAL_TABLET | Freq: Every day | ORAL | 0 refills | Status: DC

## 2022-02-14 ENCOUNTER — Encounter: Payer: Self-pay | Admitting: Internal Medicine

## 2022-02-14 ENCOUNTER — Ambulatory Visit (INDEPENDENT_AMBULATORY_CARE_PROVIDER_SITE_OTHER): Payer: Medicare Other

## 2022-02-14 ENCOUNTER — Ambulatory Visit (INDEPENDENT_AMBULATORY_CARE_PROVIDER_SITE_OTHER): Payer: Medicare Other | Admitting: Internal Medicine

## 2022-02-14 VITALS — BP 128/77 | HR 82 | Temp 97.8°F | Ht 69.0 in | Wt 366.7 lb

## 2022-02-14 DIAGNOSIS — D72819 Decreased white blood cell count, unspecified: Secondary | ICD-10-CM | POA: Diagnosis not present

## 2022-02-14 DIAGNOSIS — I5043 Acute on chronic combined systolic (congestive) and diastolic (congestive) heart failure: Secondary | ICD-10-CM

## 2022-02-14 DIAGNOSIS — R188 Other ascites: Secondary | ICD-10-CM | POA: Diagnosis not present

## 2022-02-14 DIAGNOSIS — K7460 Unspecified cirrhosis of liver: Secondary | ICD-10-CM

## 2022-02-14 DIAGNOSIS — Z0000 Encounter for general adult medical examination without abnormal findings: Secondary | ICD-10-CM | POA: Diagnosis not present

## 2022-02-14 DIAGNOSIS — Z87891 Personal history of nicotine dependence: Secondary | ICD-10-CM

## 2022-02-14 LAB — COMPREHENSIVE METABOLIC PANEL
ALT: 17 U/L (ref 0–44)
AST: 23 U/L (ref 15–41)
Albumin: 3.2 g/dL — ABNORMAL LOW (ref 3.5–5.0)
Alkaline Phosphatase: 98 U/L (ref 38–126)
Anion gap: 7 (ref 5–15)
BUN: 22 mg/dL (ref 8–23)
CO2: 27 mmol/L (ref 22–32)
Calcium: 8.8 mg/dL — ABNORMAL LOW (ref 8.9–10.3)
Chloride: 105 mmol/L (ref 98–111)
Creatinine, Ser: 1.44 mg/dL — ABNORMAL HIGH (ref 0.44–1.00)
GFR, Estimated: 41 mL/min — ABNORMAL LOW (ref >60)
Glucose, Bld: 127 mg/dL — ABNORMAL HIGH (ref 70–99)
Potassium: 4 mmol/L (ref 3.5–5.1)
Sodium: 139 mmol/L (ref 135–145)
Total Bilirubin: 1.1 mg/dL (ref 0.3–1.2)
Total Protein: 7.7 g/dL (ref 6.5–8.1)

## 2022-02-14 LAB — BRAIN NATRIURETIC PEPTIDE: B Natriuretic Peptide: 1332.6 pg/mL — ABNORMAL HIGH (ref 0.0–100.0)

## 2022-02-14 MED ORDER — TORSEMIDE 40 MG PO TABS: 40.0000 mg | ORAL_TABLET | Freq: Every day | ORAL | 0 refills | Status: DC

## 2022-02-14 NOTE — Patient Instructions (Signed)

## 2022-02-14 NOTE — Progress Notes (Signed)
Subjective:   Tina Lara is a 66 y.o. female who presents for an Initial Medicare Annual Wellness Visit. I connected with  [place] on 02/14/22 by a audio enabled telemedicine application and verified that I am speaking with the correct person using two identifiers.  Patient Location: Home  Provider Location: Office/Clinic  I discussed the limitations of evaluation and management by telemedicine. The patient expressed understanding and agreed to proceed.  Review of Systems    Deferred  to pcp  Cardiac Risk Factors include: diabetes mellitus;hypertension     Objective:    Today's Vitals   02/14/22 1501  PainSc: 10-Worst pain ever   There is no height or weight on file to calculate BMI.     02/14/2022    3:13 PM 02/14/2022    8:24 AM 02/07/2022   10:14 AM 01/28/2022    7:23 AM 01/15/2022    8:41 AM 09/06/2021    8:45 AM 09/21/2020    8:48 AM  Advanced Directives  Does Patient Have a Medical Advance Directive? No No No No No No Yes  Type of Transport planner;Living will  Does patient want to make changes to medical advance directive?       No - Patient declined  Copy of [place] in Chart?       No - copy requested  Would patient like information on creating a medical advance directive?  No - Patient declined No - Patient declined  No - Patient declined Yes (ED - Information included in AVS) No - Patient declined    Current Medications (verified) Outpatient Encounter Medications as of 02/14/2022  Medication Sig   acetaminophen (TYLENOL) 500 MG tablet Take 1,500 mg by mouth every 6 (six) hours as needed for moderate pain or headache.    aspirin 81 MG EC tablet Take 1 tablet (81 mg total) by mouth daily. (Patient taking differently: Take 81 mg by mouth every other day.)   atorvastatin (LIPITOR) 40 MG tablet Take 1 tablet (40 mg total) by mouth daily.   dapagliflozin propanediol (FARXIGA) 10 MG TABS tablet  Take 1 tablet (10 mg total) by mouth daily before breakfast.   dicyclomine (BENTYL) 20 MG tablet Take 1 tablet (20 mg total) by mouth 2 (two) times daily as needed for spasms (abdominal pain).   Hypromellose (ARTIFICIAL TEARS OP) Place 1 drop into both eyes daily as needed (for dry eyes).   ibuprofen (ADVIL,MOTRIN) 200 MG tablet Take 400 mg by mouth every 6 (six) hours as needed for headache or moderate pain.    lactulose (CHRONULAC) 10 GM/15ML solution Take 15 mLs (10 g total) by mouth 2 (two) times daily as needed for mild constipation.   lisinopril (ZESTRIL) 10 MG tablet Take 1 tablet (10 mg total) by mouth daily.   Menthol, Topical Analgesic, (BIOFREEZE EX) Apply 1 application topically daily as needed (back pain).   pantoprazole (PROTONIX) 40 MG tablet Take 1 tablet (40 mg total) by mouth daily.   Semaglutide,0.25 or 0.'5MG'$ /DOS, 2 MG/3ML SOPN Inject 0.25 mg into the skin once a week.   torsemide 40 MG TABS Take 40 mg by mouth daily.   ferrous sulfate 325 (65 FE) MG tablet Take 1 tablet (325 mg total) by mouth daily.   No facility-administered encounter medications on file as of 02/14/2022.    Allergies (verified) Percocet [oxycodone-acetaminophen]   History: Past Medical History:  Diagnosis Date   Anxiety  Cataract    NS OD   Chest pain 83/41/9622   Complication of anesthesia    Per patient "needs a lot of anesthesia w/surgery" ,I had toruble with a spinal block not taking "   Diabetes mellitus without complication (HCC)    Type 2   Diabetic retinopathy ([place])    BDR   Full dentures    GERD (gastroesophageal reflux disease)    Heart failure ([place])    History of surgery on arm    right arm broken   Hyperlipidemia    Hypertension    never diagnosed by physician per pt   Macular degeneration    Obesity    Past Surgical History:  Procedure Laterality Date   BIOPSY  09/30/2017   Procedure: BIOPSY;  Surgeon: Jerene Bears, MD;  Location: [facility];  Service:  Gastroenterology;;   CERVICAL CONIZATION W/BX N/A 09/10/2017   Procedure: CONIZATION CERVIX WITH BIOPSY - COLD KNIFE;  Surgeon: Emily Filbert, MD;  Location: [facility];  Service: Gynecology;  Laterality: N/A;   CESAREAN SECTION  1989   x 1   CHOLECYSTECTOMY     gall stones   COLONOSCOPY WITH PROPOFOL N/A 09/30/2017   Procedure: COLONOSCOPY WITH PROPOFOL;  Surgeon: Jerene Bears, MD;  Location: [facility];  Service: Gastroenterology;  Laterality: N/A;   DILATION AND CURETTAGE OF UTERUS N/A 09/10/2017   Procedure: DILATATION AND CURETTAGE;  Surgeon: Emily Filbert, MD;  Location: [facility];  Service: Gynecology;  Laterality: N/A;   MULTIPLE TOOTH EXTRACTIONS     dentures upper and lowers   POLYPECTOMY  09/30/2017   Procedure: POLYPECTOMY;  Surgeon: Jerene Bears, MD;  Location: [facility];  Service: Gastroenterology;;   TONSILLECTOMY     Family History  Problem Relation Age of Onset   Kidney disease Mother    Heart disease Mother    Diabetes Mother    Seizures Father    Heart disease Father    Other Father    Breast cancer Neg Hx    Social History   Socioeconomic History   Marital status: Divorced    Spouse name: Not on file   Number of children: Not on file   Years of education: Not on file   Highest education level: Not on file  Occupational History   Not on file  Tobacco Use   Smoking status: Former    Packs/day: 0.25    Years: 6.00    Total pack years: 1.50    Types: Cigarettes   Smokeless tobacco: Never   Tobacco comments:    quit 36 yrs ago  Vaping Use   Vaping Use: Never used  Substance and Sexual Activity   Alcohol use: Yes    Comment: occasionally   Drug use: No   Sexual activity: Not Currently    Birth control/protection: None  Other Topics Concern   Not on file  Social History Narrative   Not on file   Social Determinants of Health   Financial Resource Strain: High Risk (02/14/2022)   Overall Financial Resource Strain (CARDIA)    Difficulty of Paying  Living Expenses: Hard  Food Insecurity: Food Insecurity Present (02/14/2022)   Hunger Vital Sign    Worried About Running Out of Food in the Last Year: Sometimes true    Ran Out of Food in the Last Year: Sometimes true  Transportation Needs: No Transportation Needs (02/14/2022)   PRAPARE - Hydrologist (Medical): No  Lack of Transportation (Non-Medical): No  Physical Activity: Inactive (02/14/2022)   Exercise Vital Sign    Days of Exercise per Week: 0 days    Minutes of Exercise per Session: 0 min  Stress: No Stress Concern Present (02/14/2022)   [place]    Feeling of Stress : Not at all  Social Connections: Moderately Isolated (02/14/2022)   Social Connection and Isolation Panel [NHANES]    Frequency of Communication with Friends and Family: Twice a week    Frequency of Social Gatherings with Friends and Family: Once a week    Attends Religious Services: More than 4 times per year    Active Member of [employer] or Organizations: No    Attends Music therapist: Never    Marital Status: Divorced    Tobacco Counseling Counseling given: Not Answered Tobacco comments: quit 36 yrs ago   Clinical Intake:  Pre-visit preparation completed: Yes  Pain : 0-10 Pain Score: 10-Worst pain ever Pain Type: Acute pain Pain Location: Abdomen Pain Orientation: Upper, Mid, Lower Pain Descriptors / Indicators: Aching, Burning, Constant Pain Onset: 1 to 4 weeks ago     Diabetes: Yes Did pt. bring in CBG monitor from home?: No  How often do you need to have someone help you when you read instructions, pamphlets, or other written materials from your doctor or pharmacy?: 1 - Never What is the last grade level you completed in school?: college  Diabetic?yes   Interpreter Needed?: No  Information entered by :: Jaymir Struble   Activities of Daily Living    02/14/2022    3:14 PM  02/14/2022    8:45 AM  In your present state of health, do you have any difficulty performing the following activities:  Hearing? 0 0  Vision? 1 0  Difficulty concentrating or making decisions? 0 0  Walking or climbing stairs? 1 1  Dressing or bathing? 0 0  Doing errands, shopping? 1 1  Preparing Food and eating ? N   Using the Toilet? N   In the past six months, have you accidently leaked urine? N   Do you have problems with loss of bowel control? N   Managing your Medications? Y   Managing your Finances? N   Housekeeping or managing your Housekeeping? N     Patient Care Team: Velna Ochs, MD as PCP - General (Internal Medicine) Gevena Cotton, MD as Consulting Physician (Ophthalmology)  Indicate any recent Medical Services you may have received from other than [facility] providers in the past year (date may be approximate).     Assessment:   This is a routine wellness examination for [place].  Hearing/Vision screen No results found.  Dietary issues and exercise activities discussed: Current Exercise Habits: The patient does not participate in regular exercise at present   Goals Addressed   None   Depression Screen    02/14/2022    3:12 PM 02/14/2022    8:24 AM 02/07/2022   10:17 AM 02/07/2022   10:15 AM 09/06/2021    8:44 AM 09/21/2020    8:49 AM 08/24/2020    9:13 AM  PHQ 2/9 Scores  PHQ - 2 Score 0 0 0 0 0 0 0    Fall Risk    02/14/2022    3:13 PM 02/14/2022    8:24 AM 02/07/2022   10:15 AM 01/15/2022    8:41 AM 09/06/2021    8:44 AM  Fall Risk   Falls in  the past year? '1 1 1 1 1  '$ Number falls in past yr: 0 '1 1 1 '$ 0  Injury with Fall? 0 0 0 0 0  Risk for fall due to : Impaired balance/gait Impaired balance/gait;History of fall(s) Impaired balance/gait;History of fall(s) Impaired balance/gait No Fall Risks;Impaired balance/gait  Follow up Falls evaluation completed;Falls prevention discussed Falls evaluation completed;Falls prevention discussed Falls  evaluation completed;Falls prevention discussed Falls evaluation completed Falls evaluation completed;Falls prevention discussed    FALL RISK PREVENTION PERTAINING TO THE HOME:  Any stairs in or around the home? No  If so, are there any without handrails? No  Home free of loose throw rugs in walkways, pet beds, electrical cords, etc? Yes  Adequate lighting in your home to reduce risk of falls? Yes   ASSISTIVE DEVICES UTILIZED TO PREVENT FALLS:  Life alert? No  Use of a cane, walker or w/c? Yes  Grab bars in the bathroom? No  Shower chair or bench in shower? No  Elevated toilet seat or a handicapped toilet? No   TIMED UP AND GO:  Was the test performed? No .  Length of time to ambulate 10 feet: n/a sec.     Cognitive Function:        02/14/2022    3:18 PM  6CIT Screen  What Year? 0 points  What month? 0 points  What time? 0 points  Count back from 20 0 points  Months in reverse 0 points  Repeat phrase 0 points  Total Score 0 points    Immunizations  There is no immunization history on file for this patient.  TDAP status: Due, Education has been provided regarding the importance of this vaccine. Advised may receive this vaccine at local pharmacy or Health Dept. Aware to provide a copy of the vaccination record if obtained from local pharmacy or Health Dept. Verbalized acceptance and understanding.  Flu Vaccine status: Due, Education has been provided regarding the importance of this vaccine. Advised may receive this vaccine at local pharmacy or Health Dept. Aware to provide a copy of the vaccination record if obtained from local pharmacy or Health Dept. Verbalized acceptance and understanding.  Pneumococcal vaccine status: Due, Education has been provided regarding the importance of this vaccine. Advised may receive this vaccine at local pharmacy or Health Dept. Aware to provide a copy of the vaccination record if obtained from local pharmacy or Health Dept. Verbalized  acceptance and understanding.  Covid-19 vaccine status: Declined, Education has been provided regarding the importance of this vaccine but patient still declined. Advised may receive this vaccine at local pharmacy or Health Dept.or vaccine clinic. Aware to provide a copy of the vaccination record if obtained from local pharmacy or Health Dept. Verbalized acceptance and understanding.  Qualifies for Shingles Vaccine? Yes   Zostavax completed No   Shingrix Completed?: No.    Education has been provided regarding the importance of this vaccine. Patient has been advised to call insurance company to determine out of pocket expense if they have not yet received this vaccine. Advised may also receive vaccine at local pharmacy or Health Dept. Verbalized acceptance and understanding.  Screening Tests Health Maintenance  Topic Date Due   COVID-19 Vaccine (1) Never done   TETANUS/TDAP  Never done   Zoster Vaccines- Shingrix (1 of 2) Never done   PAP SMEAR-Modifier  02/05/2020   OPHTHALMOLOGY EXAM  05/20/2021   INFLUENZA VACCINE  07/01/2022 (Originally 10/31/2021)   HEMOGLOBIN A1C  04/17/2022   Diabetic kidney evaluation -  Urine ACR  09/07/2022   MAMMOGRAM  11/03/2022   FOOT EXAM  01/16/2023   Diabetic kidney evaluation - GFR measurement  02/15/2023   Medicare Annual Wellness (AWV)  02/15/2023   COLONOSCOPY (Pts 45-76yr Insurance coverage will need to be confirmed)  10/01/2027   Hepatitis C Screening  Completed   HIV Screening  Completed   HPV VACCINES  Aged Out    Health Maintenance  Health Maintenance Due  Topic Date Due   COVID-19 Vaccine (1) Never done   TETANUS/TDAP  Never done   Zoster Vaccines- Shingrix (1 of 2) Never done   PAP SMEAR-Modifier  02/05/2020   OPHTHALMOLOGY EXAM  05/20/2021    Colorectal cancer screening: Type of screening: Colonoscopy. Completed 09/30/2017. Repeat every 10 years  Mammogram status: Completed 11/02/2020. Repeat every 2 year    Lung Cancer Screening:  (Low Dose CT Chest recommended if Age 64-80years, 30 pack-year currently smoking OR have quit w/in 15years.) does not qualify.   Lung Cancer Screening Referral: deferred to   Additional Screening:  Hepatitis C Screening: does qualify; Completed 01/15/2022  Vision Screening: Recommended annual ophthalmology exams for early detection of glaucoma and other disorders of the eye. Is the patient up to date with their annual eye exam?  No  Who is the provider or what is the name of the office in which the patient attends annual eye exams?  Dr sFrederico Hamman If pt is not established with a provider, would they like to be referred to a provider to establish care? No .   Dental Screening: Recommended annual dental exams for proper oral hygiene  Community Resource Referral / Chronic Care Management: CRR required this visit?  No   CCM required this visit?  No      Plan:     I have personally reviewed and noted the following in the patient's chart:   Medical and social history Use of alcohol, tobacco or illicit drugs  Current medications and supplements including opioid prescriptions. Patient is not currently taking opioid prescriptions. Functional ability and status Nutritional status Physical activity Advanced directives List of other physicians Hospitalizations, surgeries, and ER visits in previous 12 months Vitals Screenings to include cognitive, depression, and falls Referrals and appointments  In addition, I have reviewed and discussed with patient certain preventive protocols, quality metrics, and best practice recommendations. A written personalized care plan for preventive services as well as general preventive health recommendations were provided to patient.     aJudyann Munson CMA   02/14/2022   Nurse Notes:  NO FACE TO FACE    Tina Lara, Thank you for taking time to come for your Medicare Wellness Visit. I appreciate your ongoing commitment to your health goals. Please  review the following plan we discussed and let me know if I can assist you in the future.   These are the goals we discussed:  Goals      HEMOGLOBIN A1C < 7.0        This is a list of the screening recommended for you and due dates:  Health Maintenance  Topic Date Due   COVID-19 Vaccine (1) Never done   Tetanus Vaccine  Never done   Zoster (Shingles) Vaccine (1 of 2) Never done   Pap Smear  02/05/2020   Eye exam for diabetics  05/20/2021   Flu Shot  07/01/2022*   Hemoglobin A1C  04/17/2022   Yearly kidney health urinalysis for diabetes  09/07/2022   Mammogram  11/03/2022  Complete foot exam   01/16/2023   Yearly kidney function blood test for diabetes  02/15/2023   Medicare Annual Wellness Visit  02/15/2023   Colon Cancer Screening  10/01/2027   Hepatitis C Screening: USPSTF Recommendation to screen - Ages 18-79 yo.  Completed   HIV Screening  Completed   HPV Vaccine  Aged Out  *Topic was postponed. The date shown is not the original due date.

## 2022-02-14 NOTE — Progress Notes (Signed)
Subjective:   Patient ID: [name] female   DOB: [birth year] 66 y.o.   MRN: [id number]  HPI: [name] is a 66 y.o. female with past medical history outlined below here for heart failure follow up. For the details of today's visit, please refer to the assessment and plan below.   Past Medical History:  Diagnosis Date   Anxiety    Cataract    NS OD   Chest pain 37/90/2409   Complication of anesthesia    Per patient "needs a lot of anesthesia w/surgery" ,I had toruble with a spinal block not taking "   Diabetes mellitus without complication (HCC)    Type 2   Diabetic retinopathy ([place])    BDR   Full dentures    GERD (gastroesophageal reflux disease)    Heart failure ([place])    History of surgery on arm    right arm broken   Hyperlipidemia    Hypertension    never diagnosed by physician per pt   Macular degeneration    Obesity    Current Outpatient Medications  Medication Sig Dispense Refill   torsemide 40 MG TABS Take 40 mg by mouth daily. 30 tablet 0   acetaminophen (TYLENOL) 500 MG tablet Take 1,500 mg by mouth every 6 (six) hours as needed for moderate pain or headache.      aspirin 81 MG EC tablet Take 1 tablet (81 mg total) by mouth daily. (Patient taking differently: Take 81 mg by mouth every other day.) 30 tablet 0   atorvastatin (LIPITOR) 40 MG tablet Take 1 tablet (40 mg total) by mouth daily. 90 tablet 3   dapagliflozin propanediol (FARXIGA) 10 MG TABS tablet Take 1 tablet (10 mg total) by mouth daily before breakfast. 30 tablet 2   dicyclomine (BENTYL) 20 MG tablet Take 1 tablet (20 mg total) by mouth 2 (two) times daily as needed for spasms (abdominal pain). 20 tablet 0   ferrous sulfate 325 (65 FE) MG tablet Take 1 tablet (325 mg total) by mouth daily. 30 tablet 3   Hypromellose (ARTIFICIAL TEARS OP) Place 1 drop into both eyes daily as needed (for dry eyes).     ibuprofen (ADVIL,MOTRIN) 200 MG tablet Take 400 mg by mouth every 6 (six) hours as  needed for headache or moderate pain.      lactulose (CHRONULAC) 10 GM/15ML solution Take 15 mLs (10 g total) by mouth 2 (two) times daily as needed for mild constipation. 236 mL 0   lisinopril (ZESTRIL) 10 MG tablet Take 1 tablet (10 mg total) by mouth daily. 90 tablet 3   Menthol, Topical Analgesic, (BIOFREEZE EX) Apply 1 application topically daily as needed (back pain).     pantoprazole (PROTONIX) 40 MG tablet Take 1 tablet (40 mg total) by mouth daily. 30 tablet 2   Semaglutide,0.25 or 0.'5MG'$ /DOS, 2 MG/3ML SOPN Inject 0.25 mg into the skin once a week. 3 mL 2   No current facility-administered medications for this visit.   Family History  Problem Relation Age of Onset   Kidney disease Mother    Heart disease Mother    Diabetes Mother    Seizures Father    Heart disease Father    Other Father    Breast cancer Neg Hx    Social History   Socioeconomic History   Marital status: Divorced    Spouse name: Not on file   Number of children: Not on file   Years of education: Not on  file   Highest education level: Not on file  Occupational History   Not on file  Tobacco Use   Smoking status: Former    Packs/day: 0.25    Years: 6.00    Total pack years: 1.50    Types: Cigarettes   Smokeless tobacco: Never   Tobacco comments:    quit 36 yrs ago  Vaping Use   Vaping Use: Never used  Substance and Sexual Activity   Alcohol use: Yes    Comment: occasionally   Drug use: No   Sexual activity: Not Currently    Birth control/protection: None  Other Topics Concern   Not on file  Social History Narrative   Not on file   Social Determinants of Health   Financial Resource Strain: Not on file  Food Insecurity: Food Insecurity Present (02/07/2022)   Hunger Vital Sign    Worried About Running Out of Food in the Last Year: Sometimes true    Ran Out of Food in the Last Year: Sometimes true  Transportation Needs: No Transportation Needs (01/29/2022)   PRAPARE - Civil engineer, contracting (Medical): No    Lack of Transportation (Non-Medical): No  Physical Activity: Not on file  Stress: Not on file  Social Connections: Moderately Integrated (02/07/2022)   Social Connection and Isolation Panel [NHANES]    Frequency of Communication with Friends and Family: Twice a week    Frequency of Social Gatherings with Friends and Family: Once a week    Attends Religious Services: More than 4 times per year    Active Member of [employer] or Organizations: Yes    Attends Music therapist: More than 4 times per year    Marital Status: Divorced    Review of Systems: ROS   Objective:  Physical Exam:  Vitals:   02/14/22 0821  BP: 128/77  Pulse: 82  Temp: 97.8 F (36.6 C)  TempSrc: Oral  SpO2: 100%  Weight: (!) 366 lb 11.2 oz (166.3 kg)  Height: '5\' 9"'$  (1.753 m)    Constitutional: Morbidly obese, NAD in wheelchair  Cardiovascular: diastolic murmur, RRR Pulmonary/Chest: distant lung sounds, no wheezing or rhonchi  Abdominal: Distended due to habitus Extremities: 1+ edema    Assessment & Plan:   Acute on chronic combined systolic and diastolic CHF (congestive heart failure) ([place]) Patient was seen last week for swelling, dyspnea, and abdominal pain. Echocardiogram showed newly reduced LV EF of 25-30%, severe grade III diastolic dysfunction (restrictive), and moderately reduced right ventricular systolic function. We attempted to diurese her with oral lasix 80 mg once a day, unfortunately she has not noticed any improvement in her symtpoms. Her weight today is unchanged (54 kg from 53.9 kg last week) although she reports losing 10 lbs on her home scale. I suspect her abdominal symptoms are related to gut edema from decompesnated HF exacerbation which is likely limiting bioavailabilty of lasix. At this point, I think it's best to bring her into the hospital for close monitoring and IV diuresis. Unfortuantely patient has declined. Will switch her lasix to  torsemide with close follow up. I am hesitant to be too aggressive with diuretics given her right heart failure and AKI. Sent Rx for torsemide 40 mg once a day and placed an urgent referral to cardiology. Given her chronic leukopenia, anemia, and restrictive diastolic failure seen on echo, I have also collected MM screening labs. Will follow these up. Stat CMP and BNP today with stable renal dysfunction, BNP significantly  elevated at 1,332. -- Stop lasix; start torsemide 40 mg once a day  -- Patient is not taking lisinopril; consider starting entresto at follow up if renal function is stable. 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 Coag were mildly elevated but platelets and albumin WNL. Elastography and GI referral ordered at her last visit. I suspect her GI symptoms are related to heart heart failure and will see if these improve once she is optimized from a cardiac standpoint with diuresis and GDMT.

## 2022-02-14 NOTE — Progress Notes (Signed)
Internal Medicine Clinic Attending  Case and documentation reviewed.  I reviewed the AWV findings.  I agree with the assessment, diagnosis, and plan of care documented in the AWV note.     

## 2022-02-14 NOTE — Addendum Note (Signed)
Addended by: Jodean Lima on: 02/14/2022 04:05 PM   Modules accepted: Level of Service

## 2022-02-14 NOTE — Assessment & Plan Note (Signed)
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 Coag were mildly elevated but platelets and albumin WNL. Elastography and GI referral ordered at her last visit. I suspect her GI symptoms are related to heart heart failure and will see if these improve once she is optimized from a cardiac standpoint with diuresis and GDMT.

## 2022-02-14 NOTE — Assessment & Plan Note (Signed)
Patient was seen last week for swelling, dyspnea, and abdominal pain. Echocardiogram showed newly reduced LV EF of 25-30%, severe grade III diastolic dysfunction (restrictive), and moderately reduced right ventricular systolic function. We attempted to diurese her with oral lasix 80 mg once a day, unfortunately she has not noticed any improvement in her symtpoms. Her weight today is unchanged (54 kg from 53.9 kg last week) although she reports losing 10 lbs on her home scale. I suspect her abdominal symptoms are related to gut edema from decompesnated HF exacerbation which is likely limiting bioavailabilty of lasix. At this point, I think it's best to bring her into the hospital for close monitoring and IV diuresis. Unfortuantely patient has declined. Will switch her lasix to torsemide with close follow up. I am hesitant to be too aggressive with diuretics given her right heart failure and AKI. Sent Rx for torsemide 40 mg once a day and placed an urgent referral to cardiology. Given her chronic leukopenia, anemia, and restrictive diastolic failure seen on echo, I have also collected MM screening labs. Will follow these up. Stat CMP and BNP today with stable renal dysfunction, BNP significantly elevated at 1,332. -- Stop lasix; start torsemide 40 mg once a day  -- Patient is not taking lisinopril; consider starting entresto at follow up if renal function is stable. Hold off on beta blocker for now with her severe diastolic dysfunction and hypervolemia  -- Urgent card referral  -- F/u SPEP, IFE, and free light chains  -- Follow up scheduled for next week as an add-on

## 2022-02-15 ENCOUNTER — Telehealth: Payer: Self-pay

## 2022-02-15 ENCOUNTER — Other Ambulatory Visit: Payer: Self-pay | Admitting: Internal Medicine

## 2022-02-15 DIAGNOSIS — I5043 Acute on chronic combined systolic (congestive) and diastolic (congestive) heart failure: Secondary | ICD-10-CM

## 2022-02-15 MED ORDER — TORSEMIDE 20 MG PO TABS: 40.0000 mg | ORAL_TABLET | Freq: Every day | ORAL | 0 refills | Status: DC

## 2022-02-15 NOTE — Telephone Encounter (Signed)
Prior Authorization for patient Tina Lara) came through on cover my meds was submitted with last office notes awaiting approval or denial

## 2022-02-15 NOTE — Telephone Encounter (Signed)
[place]: [id number] - [place] Case ID: [id number] Need help? Call us at [phone] Outcome N/Atoday We have dismissed the request for coverage we received from you or your prescriber because notification has been received from you or your prescriber that you will be switched to a formulary alternative and/or quantity allowed by your benefit, or you or your provider withdrew the request. If any alteration to an existing prescription or a new prescription is needed, please have the provider contact your pharmacy. The pharmacy may contact the Help Desk at [phone] if they need assistance in processing a claim. If you don't agree with this decision, within 6 months, you or your provider can call [phone] and ask [place] to vacate or reconsider the dismissal. Your provider can also submit a request online at [email or web address]. Drug [employer] '40MG'$  tablets Form [employer] Electronic Prior Authorization Form [phone] NCPDP)

## 2022-02-15 NOTE — Addendum Note (Signed)
Addended by: Jodean Lima on: 02/15/2022 09:29 AM   Modules accepted: Orders

## 2022-02-16 NOTE — Progress Notes (Signed)
Called patient with labs that have resulted so far. 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 Free light chains are mildly elevated but ratio is low at 2.45. Overall ressuring and not consistent with a plasma cell dyscrasia. Patient picked up her torsemide prescription yesterday and reports feeling better already. She is scheduled to follow up with me next Tuesday.

## 2022-02-19 LAB — PROTEIN ELECTROPHORESIS, SERUM
A/G Ratio: 0.8 (ref 0.7–1.7)
Albumin ELP: 3.3 g/dL (ref 2.9–4.4)
Alpha 1: 0.2 g/dL (ref 0.0–0.4)
Alpha 2: 0.6 g/dL (ref 0.4–1.0)
Beta: 1 g/dL (ref 0.7–1.3)
Gamma Globulin: 2.5 g/dL — ABNORMAL HIGH (ref 0.4–1.8)
Globulin, Total: 4.4 g/dL — ABNORMAL HIGH (ref 2.2–3.9)

## 2022-02-19 LAB — IMMUNOFIXATION ELECTROPHORESIS
IgA/Immunoglobulin A, Serum: 278 mg/dL (ref 87–352)
IgG (Immunoglobin G), Serum: 2494 mg/dL — ABNORMAL HIGH (ref 586–1602)
IgM (Immunoglobulin M), Srm: 128 mg/dL (ref 26–217)
Total Protein: 7.7 g/dL (ref 6.0–8.5)

## 2022-02-19 LAB — KAPPA/LAMBDA LIGHT CHAINS
Ig Kappa Free Light Chain: 66.9 mg/L — ABNORMAL HIGH (ref 3.3–19.4)
Ig Lambda Free Light Chain: 27.3 mg/L — ABNORMAL HIGH (ref 5.7–26.3)
KAPPA/LAMBDA RATIO: 2.45 — ABNORMAL HIGH (ref 0.26–1.65)

## 2022-02-20 ENCOUNTER — Encounter: Payer: Self-pay | Admitting: Internal Medicine

## 2022-02-20 ENCOUNTER — Other Ambulatory Visit: Payer: Self-pay

## 2022-02-20 ENCOUNTER — Ambulatory Visit: Payer: Medicare Other | Attending: Internal Medicine | Admitting: Internal Medicine

## 2022-02-20 ENCOUNTER — Telehealth: Payer: Self-pay

## 2022-02-20 ENCOUNTER — Encounter (HOSPITAL_COMMUNITY): Payer: Self-pay

## 2022-02-20 ENCOUNTER — Ambulatory Visit (INDEPENDENT_AMBULATORY_CARE_PROVIDER_SITE_OTHER): Payer: Medicare Other | Admitting: Internal Medicine

## 2022-02-20 VITALS — BP 126/73 | HR 86 | Temp 98.2°F | Resp 28 | Ht 69.0 in | Wt 351.9 lb

## 2022-02-20 VITALS — BP 134/81 | HR 87 | Ht 69.0 in | Wt 350.0 lb

## 2022-02-20 DIAGNOSIS — I5021 Acute systolic (congestive) heart failure: Secondary | ICD-10-CM

## 2022-02-20 DIAGNOSIS — I5043 Acute on chronic combined systolic (congestive) and diastolic (congestive) heart failure: Secondary | ICD-10-CM

## 2022-02-20 DIAGNOSIS — Z87891 Personal history of nicotine dependence: Secondary | ICD-10-CM

## 2022-02-20 LAB — COMPREHENSIVE METABOLIC PANEL
ALT: 17 U/L (ref 0–44)
AST: 24 U/L (ref 15–41)
Albumin: 3 g/dL — ABNORMAL LOW (ref 3.5–5.0)
Alkaline Phosphatase: 105 U/L (ref 38–126)
Anion gap: 10 (ref 5–15)
BUN: 28 mg/dL — ABNORMAL HIGH (ref 8–23)
CO2: 29 mmol/L (ref 22–32)
Calcium: 8.9 mg/dL (ref 8.9–10.3)
Chloride: 102 mmol/L (ref 98–111)
Creatinine, Ser: 1.57 mg/dL — ABNORMAL HIGH (ref 0.44–1.00)
GFR, Estimated: 37 mL/min — ABNORMAL LOW (ref >60)
Glucose, Bld: 131 mg/dL — ABNORMAL HIGH (ref 70–99)
Potassium: 4.3 mmol/L (ref 3.5–5.1)
Sodium: 141 mmol/L (ref 135–145)
Total Bilirubin: 1.2 mg/dL (ref 0.3–1.2)
Total Protein: 7.5 g/dL (ref 6.5–8.1)

## 2022-02-20 LAB — BRAIN NATRIURETIC PEPTIDE: B Natriuretic Peptide: 1423.2 pg/mL — ABNORMAL HIGH (ref 0.0–100.0)

## 2022-02-20 NOTE — Assessment & Plan Note (Addendum)
Patient is here for follow-up of recently diagnosed CHF exacerbation.  She initially presented to me on 11/8 with symptoms of abdominal pain, dyspnea, and lower extremity swelling.  Echocardiogram revealed new biventricular failure with LVEF of 20-25%, moderately reduced right ventricular systolic function, and grade III restrictive diastolic dysfunction.  At her visit last week, I offered to admit her for IV diuresis and inpatient monitoring given lack of improvement with Lasix, however patient opted to continue with outpatient management.  Her Lasix was changed to torsemide 40 mg daily and I placed an urgent referral for her to be evaluated by cardiology.  Multiple myeloma screening labs were also checked and not consistent with plasma cell dyscrasia.  She is doing much better today.  Symptoms of abdominal pain and swelling have improved.  Her weight is down 14.8 lbs since last week, weight today is 351.9 lbs.  Our office was able to arrange a new patient appointment for her this afternoon with cardiology.  I am rechecking urgent CMP and BNP so that hopefully these will result before her appointment.  I did discuss with patient that cardiology will likely want to switch her lisinopril to [facility], but will wait for her renal function to result and defer to cardiology since she is seeing them later today. --Continue torsemide 40 mg daily - Follow-up repeat labs - To see cardiology this afternoon, appreciate assistance

## 2022-02-20 NOTE — Progress Notes (Signed)
Cardiology Office Note:    Date:  02/20/2022   ID:  EDRA RICCARDI, DOB [birth year], MRN [id number]  PCP:  Velna Ochs, MD   [place] Providers Cardiologist:  None     Referring MD: Velna Ochs, MD   No chief complaint on file. Systolic Heart Failure  History of Present Illness:    Tina Lara is a 66 y.o. female with a hx of GERD, cirrhosis, HTN, DM2, obesity BMI 51, referral from Dr. Philipp Ovens who saw her in clinic 11/8 with symptoms of abdominal pain, dyspnea and LE swelling.   She was seen prior in the ED on 01/28/2022 with SOB for 1 weeks and orthopnea. Signs of CHF. She also had leg swelling. In the ED she also reported recent trip to [place]. She underwent CT PE. No PE. Liver c/f possible cirrhosis. She was given a dose of lasix in the ED and recommended to increase it for 2 days. They messaged her PCP regarding an upcoming visit. Arranged from FU with cardiology. No plan for admission or further w/u.  She presented to clinic on 11/8 for the above symptoms. She had an echo done showing systolic BI-V failure EF 06-30%, moderately dilated RV, grade III diastolic dysfunction. Small pericardial effusion. She had elevated Kappa/Lambda ratio 2.45. BNP 1423.   She had an echo prior 08/15/2016 that showed normal LV function, moderate LVH, grade I DD  Today, I spoke with her and her daughter. She lost about 15 pounds on torsemide and diuresed well. However she has elevated JVD and significant LE edema. She had significant dyspnea on minimal exertion  Wt Readings from Last 3 Encounters:  02/20/22 (!) 350 lb (158.8 kg)  02/20/22 (!) 351 lb 14.4 oz (159.6 kg)  02/14/22 (!) 366 lb 11.2 oz (166.3 kg)     Past Medical History:  Diagnosis Date   Anxiety    Cataract    NS OD   Chest pain 16/04/930   Complication of anesthesia    Per patient "needs a lot of anesthesia w/surgery" ,I had toruble with a spinal block not taking "   Diabetes mellitus without  complication (HCC)    Type 2   Diabetic retinopathy (HCC)    BDR   Full dentures    GERD (gastroesophageal reflux disease)    Heart failure ([place])    History of surgery on arm    right arm broken   Hyperlipidemia    Hypertension    never diagnosed by physician per pt   Macular degeneration    Obesity     Past Surgical History:  Procedure Laterality Date   BIOPSY  09/30/2017   Procedure: BIOPSY;  Surgeon: Jerene Bears, MD;  Location: [facility];  Service: Gastroenterology;;   CERVICAL CONIZATION W/BX N/A 09/10/2017   Procedure: CONIZATION CERVIX WITH BIOPSY - COLD KNIFE;  Surgeon: Emily Filbert, MD;  Location: [facility];  Service: Gynecology;  Laterality: N/A;   CESAREAN SECTION  1989   x 1   CHOLECYSTECTOMY     gall stones   COLONOSCOPY WITH PROPOFOL N/A 09/30/2017   Procedure: COLONOSCOPY WITH PROPOFOL;  Surgeon: Jerene Bears, MD;  Location: [facility];  Service: Gastroenterology;  Laterality: N/A;   DILATION AND CURETTAGE OF UTERUS N/A 09/10/2017   Procedure: DILATATION AND CURETTAGE;  Surgeon: Emily Filbert, MD;  Location: [facility];  Service: Gynecology;  Laterality: N/A;   MULTIPLE TOOTH EXTRACTIONS     dentures upper and lowers   POLYPECTOMY  09/30/2017   Procedure: POLYPECTOMY;  Surgeon: Jerene Bears, MD;  Location: [facility];  Service: Gastroenterology;;   TONSILLECTOMY      Current Medications: Current Meds  Medication Sig   acetaminophen (TYLENOL) 500 MG tablet Take 1,500 mg by mouth every 6 (six) hours as needed for moderate pain or headache.    dicyclomine (BENTYL) 20 MG tablet Take 1 tablet (20 mg total) by mouth 2 (two) times daily as needed for spasms (abdominal pain).   ferrous sulfate 325 (65 FE) MG tablet Take 1 tablet (325 mg total) by mouth daily.   ibuprofen (ADVIL,MOTRIN) 200 MG tablet Take 400 mg by mouth every 6 (six) hours as needed for headache or moderate pain.    lactulose (CHRONULAC) 10 GM/15ML solution Take 15 mLs (10 g total) by mouth 2 (two) times  daily as needed for mild constipation.   lisinopril (ZESTRIL) 10 MG tablet Take 1 tablet (10 mg total) by mouth daily.   pantoprazole (PROTONIX) 40 MG tablet Take 1 tablet (40 mg total) by mouth daily.   Semaglutide,0.25 or 0.'5MG'$ /DOS, 2 MG/3ML SOPN Inject 0.25 mg into the skin once a week.   torsemide (DEMADEX) 20 MG tablet Take 2 tablets (40 mg total) by mouth daily.     Allergies:   Percocet [oxycodone-acetaminophen]   Social History   Socioeconomic History   Marital status: Divorced    Spouse name: Not on file   Number of children: Not on file   Years of education: Not on file   Highest education level: Not on file  Occupational History   Not on file  Tobacco Use   Smoking status: Former    Packs/day: 0.25    Years: 6.00    Total pack years: 1.50    Types: Cigarettes   Smokeless tobacco: Never   Tobacco comments:    quit 36 yrs ago  Vaping Use   Vaping Use: Never used  Substance and Sexual Activity   Alcohol use: Yes    Comment: occasionally   Drug use: No   Sexual activity: Not Currently    Birth control/protection: None  Other Topics Concern   Not on file  Social History Narrative   Not on file   Social Determinants of Health   Financial Resource Strain: High Risk (02/14/2022)   Overall Financial Resource Strain (CARDIA)    Difficulty of Paying Living Expenses: Hard  Food Insecurity: Food Insecurity Present (02/14/2022)   Hunger Vital Sign    Worried About Running Out of Food in the Last Year: Sometimes true    Ran Out of Food in the Last Year: Sometimes true  Transportation Needs: No Transportation Needs (02/14/2022)   PRAPARE - Hydrologist (Medical): No    Lack of Transportation (Non-Medical): No  Physical Activity: Inactive (02/14/2022)   Exercise Vital Sign    Days of Exercise per Week: 0 days    Minutes of Exercise per Session: 0 min  Stress: No Stress Concern Present (02/14/2022)   [place]    Feeling of Stress : Not at all  Social Connections: Moderately Isolated (02/14/2022)   Social Connection and Isolation Panel [NHANES]    Frequency of Communication with Friends and Family: Twice a week    Frequency of Social Gatherings with Friends and Family: Once a week    Attends Religious Services: More than 4 times per year    Active Member of [employer] or Organizations:  No    Attends Archivist Meetings: Never    Marital Status: Divorced     Family History: The patient's family history includes Diabetes in her mother; Heart disease in her father and mother; Kidney disease in her mother; Other in her father; Seizures in her father. There is no history of Breast cancer.  ROS:   Please see the history of present illness.     All other systems reviewed and are negative.  EKGs/Labs/Other Studies Reviewed:    The following studies were reviewed today:   EKG:    Recent Labs: 02/07/2022: Hemoglobin 10.7; Platelets 211 02/20/2022: ALT 17; B Natriuretic Peptide 1,423.2; BUN 28; Creatinine, Ser 1.57; Potassium 4.3; Sodium 141   Recent Lipid Panel    Component Value Date/Time   CHOL 118 09/06/2021 1014   TRIG 57 09/06/2021 1014   HDL 33 (L) 09/06/2021 1014   CHOLHDL 3.6 09/06/2021 1014   LDLCALC 72 09/06/2021 1014     Risk Assessment/Calculations:     Physical Exam:    VS:  Vitals:   02/20/22 1341  BP: 134/81  Pulse: 87  SpO2: 100%     Wt Readings from Last 3 Encounters:  02/20/22 (!) 350 lb (158.8 kg)  02/20/22 (!) 351 lb 14.4 oz (159.6 kg)  02/14/22 (!) 366 lb 11.2 oz (166.3 kg)     GEN: obese, Well nourished, well developed in no acute distress HEENT: Normal NECK: +++ JVD ;  LYMPHATICS: No lymphadenopathy CARDIAC: RRR, no murmurs, rubs, gallops RESPIRATORY:  Clear to auscultation without rales, wheezing or rhonchi  ABDOMEN: Soft, non-tender, non-distended MUSCULOSKELETAL:  large edematous LE  SKIN:  Warm and dry NEUROLOGIC:  Alert and oriented x 3 PSYCHIATRIC:  Normal affect   ASSESSMENT:    Cardiac amyloid?: She has new onset Bi-V failure, symptomatic: Grade III DD. Has small pericardial effusion, RA pressure from my estimate ~ 15 cm along with elevated K/L ratio c/f AL amyloid. Her IVS is not as thick as expected, but may be catching it early. CMR would be a nice modality to assess the Ddx for systolic dysfunction.  PLAN:    In order of problems listed above:  Recommended :  - direct admission for heart failure - IV diuresis - Recommend heme/onc consult - PYP scan and /or CMR        Medication Adjustments/Labs and Tests Ordered: Current medicines are reviewed at length with the patient today.  Concerns regarding medicines are outlined above.  No orders of the defined types were placed in this encounter.  No orders of the defined types were placed in this encounter.   Patient Instructions  Medication Instructions:  Continue same medications *If you need a refill on your cardiac medications before your next appointment, please call your pharmacy*   Lab Work: None ordered   Testing/Procedures: None ordered   Follow-Up: At [facility], you and your health needs are our priority.  As part of our continuing mission to provide you with exceptional heart care, we have created designated Provider Care Teams.  These Care Teams include your primary Cardiologist (physician) and Advanced Practice Providers (APPs -  Physician Assistants and Nurse Practitioners) who all work together to provide you with the care you need, when you need it.  We recommend signing up for the patient portal called "MyChart".  Sign up information is provided on this After Visit Summary.  MyChart is used to connect with patients for Virtual Visits (Telemedicine).  Patients are able to  view lab/test results, encounter notes, upcoming appointments, etc.  Non-urgent messages can be sent to your  provider as well.   To learn more about what you can do with MyChart, go to [email or web address].    Your next appointment:  To Be Determined    The format for your next appointment: Office   Provider:  Dr.Scorza  To Be Admitted To [facility] will be calling when a bed becomes available   Important Information About Sugar         Signed, Janina Mayo, MD  02/20/2022 2:53 PM    [place]

## 2022-02-20 NOTE — Progress Notes (Signed)
Subjective:   Patient ID: [name] female   DOB: 1955/08/20 66 y.o.   MRN: [id number]  HPI: [name] is a 66 y.o. female with past medical history outlined below here for CHF follow up. For the details of today's visit, please refer to the assessment and plan.   Past Medical History:  Diagnosis Date   Anxiety    Cataract    NS OD   Chest pain 54/65/6812   Complication of anesthesia    Per patient "needs a lot of anesthesia w/surgery" ,I had toruble with a spinal block not taking "   Diabetes mellitus without complication (HCC)    Type 2   Diabetic retinopathy ([place])    BDR   Full dentures    GERD (gastroesophageal reflux disease)    Heart failure ([place])    History of surgery on arm    right arm broken   Hyperlipidemia    Hypertension    never diagnosed by physician per pt   Macular degeneration    Obesity    Current Outpatient Medications  Medication Sig Dispense Refill   acetaminophen (TYLENOL) 500 MG tablet Take 1,500 mg by mouth every 6 (six) hours as needed for moderate pain or headache.      aspirin 81 MG EC tablet Take 1 tablet (81 mg total) by mouth daily. (Patient taking differently: Take 81 mg by mouth every other day.) 30 tablet 0   atorvastatin (LIPITOR) 40 MG tablet Take 1 tablet (40 mg total) by mouth daily. 90 tablet 3   dapagliflozin propanediol (FARXIGA) 10 MG TABS tablet Take 1 tablet (10 mg total) by mouth daily before breakfast. 30 tablet 2   dicyclomine (BENTYL) 20 MG tablet Take 1 tablet (20 mg total) by mouth 2 (two) times daily as needed for spasms (abdominal pain). 20 tablet 0   ferrous sulfate 325 (65 FE) MG tablet Take 1 tablet (325 mg total) by mouth daily. 30 tablet 3   Hypromellose (ARTIFICIAL TEARS OP) Place 1 drop into both eyes daily as needed (for dry eyes).     ibuprofen (ADVIL,MOTRIN) 200 MG tablet Take 400 mg by mouth every 6 (six) hours as needed for headache or moderate pain.      lactulose (CHRONULAC) 10 GM/15ML  solution Take 15 mLs (10 g total) by mouth 2 (two) times daily as needed for mild constipation. 236 mL 0   lisinopril (ZESTRIL) 10 MG tablet Take 1 tablet (10 mg total) by mouth daily. 90 tablet 3   Menthol, Topical Analgesic, (BIOFREEZE EX) Apply 1 application topically daily as needed (back pain).     pantoprazole (PROTONIX) 40 MG tablet Take 1 tablet (40 mg total) by mouth daily. 30 tablet 2   Semaglutide,0.25 or 0.5MG/DOS, 2 MG/3ML SOPN Inject 0.25 mg into the skin once a week. 3 mL 2   torsemide (DEMADEX) 20 MG tablet Take 2 tablets (40 mg total) by mouth daily. 30 tablet 0   No current facility-administered medications for this visit.   Family History  Problem Relation Age of Onset   Kidney disease Mother    Heart disease Mother    Diabetes Mother    Seizures Father    Heart disease Father    Other Father    Breast cancer Neg Hx    Social History   Socioeconomic History   Marital status: Divorced    Spouse name: Not on file   Number of children: Not on file   Years of education:  Not on file   Highest education level: Not on file  Occupational History   Not on file  Tobacco Use   Smoking status: Former    Packs/day: 0.25    Years: 6.00    Total pack years: 1.50    Types: Cigarettes   Smokeless tobacco: Never   Tobacco comments:    quit 36 yrs ago  Vaping Use   Vaping Use: Never used  Substance and Sexual Activity   Alcohol use: Yes    Comment: occasionally   Drug use: No   Sexual activity: Not Currently    Birth control/protection: None  Other Topics Concern   Not on file  Social History Narrative   Not on file   Social Determinants of Health   Financial Resource Strain: High Risk (02/14/2022)   Overall Financial Resource Strain (CARDIA)    Difficulty of Paying Living Expenses: Hard  Food Insecurity: Food Insecurity Present (02/14/2022)   Hunger Vital Sign    Worried About Running Out of Food in the Last Year: Sometimes true    Ran Out of Food in the  Last Year: Sometimes true  Transportation Needs: No Transportation Needs (02/14/2022)   PRAPARE - Hydrologist (Medical): No    Lack of Transportation (Non-Medical): No  Physical Activity: Inactive (02/14/2022)   Exercise Vital Sign    Days of Exercise per Week: 0 days    Minutes of Exercise per Session: 0 min  Stress: No Stress Concern Present (02/14/2022)   [place]    Feeling of Stress : Not at all  Social Connections: Moderately Isolated (02/14/2022)   Social Connection and Isolation Panel [NHANES]    Frequency of Communication with Friends and Family: Twice a week    Frequency of Social Gatherings with Friends and Family: Once a week    Attends Religious Services: More than 4 times per year    Active Member of [employer] or Organizations: No    Attends Archivist Meetings: Never    Marital Status: Divorced     Objective:  Physical Exam:  Vitals:   02/20/22 0943  BP: 126/73  Pulse: 86  Resp: (!) 28  Temp: 98.2 F (36.8 C)  TempSrc: Oral  SpO2: 100%  Weight: (!) 351 lb 14.4 oz (159.6 kg)  Height: _0  (1.753 m)    Constitutional: Obese, NAD Cardiovascular: RRR Pulmonary/Chest: Clear bilaterally, normal effort Extremities: Bilateral non pitting edema   Assessment & Plan:   Acute on chronic combined systolic and diastolic CHF (congestive heart failure) ([place]) Patient is here for follow-up of recently diagnosed CHF exacerbation.  She initially presented to me on 11/8 with symptoms of abdominal pain, dyspnea, and lower extremity swelling.  Echocardiogram revealed new biventricular failure with LVEF of 20-25%, moderately reduced right ventricular systolic function, and grade III restrictive diastolic dysfunction.  At her visit last week, I offered to admit her for IV diuresis and inpatient monitoring given lack of improvement with Lasix, however patient opted to continue  with outpatient management.  Her Lasix was changed to torsemide 40 mg daily and I placed an urgent referral for her to be evaluated by cardiology.  Multiple myeloma screening labs were also checked and not consistent with plasma cell dyscrasia.  She is doing much better today.  Symptoms of abdominal pain and swelling have improved.  Her weight is down 14.8 lbs since last week, weight today is 351.9 lbs.  Our  office was able to arrange a new patient appointment for her this afternoon with cardiology.  I am rechecking urgent CMP and BNP so that hopefully these will result before her appointment.  I did discuss with patient that cardiology will likely want to switch her lisinopril to [facility], but will wait for her renal function to result and defer to cardiology since she is seeing them later today. --Continue torsemide 40 mg daily - Follow-up repeat labs - To see cardiology this afternoon, appreciate assistance

## 2022-02-20 NOTE — Patient Instructions (Signed)
Tina Lara,  It was a pleasure to see you. I am glad you are feeling better. Please keep your appointment with cardiology this afternoon and follow up with me again in 3 months.   If you have any questions or concerns, call our clinic at [phone] or after hours call [phone] and ask for the internal medicine resident on call.   Thank you!  Dr. Darnell Level

## 2022-02-20 NOTE — H&P (Incomplete)
Cardiology Admission History and Physical   Patient ID: Tina Lara MRN: [id number]; DOB: 06/15/1955   Admission date: (Not on file)  PCP:  Velna Ochs, [place] Providers Cardiologist:  None        Chief Complaint:  ***  Patient Profile:   Tina Lara is a 66 y.o. female with a past medical history of GERD, cirrhosis, hypertension, type 2 diabetes, CKD stage 3b, morbid obesity who is being seen 02/20/2022 for the evaluation of ***.  History of Present Illness:   Tina Lara was directly admitted from cardiology office today for evaluation of DOE/volume   Of note, patient presented to ED on 01/28/22 for SOB and orthopnea and lower extremity edema. Patient was discharged with lasix and recommended cardiology follow-up. Patient was seen by her PCP Dr. Philipp Ovens in clinic today and referred to our cardiology urgently due to reduced LVEF. At home, she was on torsemide '40mg'$  daily and loast approximately 15lbs since last week per chart review.     Past Medical History:  Diagnosis Date   Anxiety    Cataract    NS OD   Chest pain 82/99/3716   Complication of anesthesia    Per patient "needs a lot of anesthesia w/surgery" ,I had toruble with a spinal block not taking "   Diabetes mellitus without complication (HCC)    Type 2   Diabetic retinopathy (HCC)    BDR   Full dentures    GERD (gastroesophageal reflux disease)    Heart failure ([place])    History of surgery on arm    right arm broken   Hyperlipidemia    Hypertension    never diagnosed by physician per pt   Macular degeneration    Obesity     Past Surgical History:  Procedure Laterality Date   BIOPSY  09/30/2017   Procedure: BIOPSY;  Surgeon: Jerene Bears, MD;  Location: [facility];  Service: Gastroenterology;;   CERVICAL CONIZATION W/BX N/A 09/10/2017   Procedure: CONIZATION CERVIX WITH BIOPSY - COLD KNIFE;  Surgeon: Emily Filbert, MD;  Location: [facility];  Service: Gynecology;   Laterality: N/A;   CESAREAN SECTION  1989   x 1   CHOLECYSTECTOMY     gall stones   COLONOSCOPY WITH PROPOFOL N/A 09/30/2017   Procedure: COLONOSCOPY WITH PROPOFOL;  Surgeon: Jerene Bears, MD;  Location: [facility];  Service: Gastroenterology;  Laterality: N/A;   DILATION AND CURETTAGE OF UTERUS N/A 09/10/2017   Procedure: DILATATION AND CURETTAGE;  Surgeon: Emily Filbert, MD;  Location: [facility];  Service: Gynecology;  Laterality: N/A;   MULTIPLE TOOTH EXTRACTIONS     dentures upper and lowers   POLYPECTOMY  09/30/2017   Procedure: POLYPECTOMY;  Surgeon: Jerene Bears, MD;  Location: [facility];  Service: Gastroenterology;;   TONSILLECTOMY       Medications Prior to Admission: Prior to Admission medications   Medication Sig Start Date End Date Taking? Authorizing Provider  acetaminophen (TYLENOL) 500 MG tablet Take 1,500 mg by mouth every 6 (six) hours as needed for moderate pain or headache.     [provider]  aspirin 81 MG EC tablet Take 1 tablet (81 mg total) by mouth daily. Patient not taking: Reported on 02/20/2022 08/16/16   Shela Leff, MD  atorvastatin (LIPITOR) 40 MG tablet Take 1 tablet (40 mg total) by mouth daily. Patient not taking: Reported on 02/20/2022 09/06/21 09/06/22  Orvis Brill, MD  dapagliflozin  propanediol (FARXIGA) 10 MG TABS tablet Take 1 tablet (10 mg total) by mouth daily before breakfast. Patient not taking: Reported on 02/20/2022 09/06/21   Orvis Brill, MD  dicyclomine (BENTYL) 20 MG tablet Take 1 tablet (20 mg total) by mouth 2 (two) times daily as needed for spasms (abdominal pain). 02/07/22   Velna Ochs, MD  ferrous sulfate 325 (65 FE) MG tablet Take 1 tablet (325 mg total) by mouth daily. 09/06/21 02/20/22  Orvis Brill, MD  Hypromellose (ARTIFICIAL TEARS OP) Place 1 drop into both eyes daily as needed (for dry eyes). Patient not taking: Reported on 02/20/2022    [provider]  ibuprofen (ADVIL,MOTRIN) 200  MG tablet Take 400 mg by mouth every 6 (six) hours as needed for headache or moderate pain.     [provider]  lactulose (CHRONULAC) 10 GM/15ML solution Take 15 mLs (10 g total) by mouth 2 (two) times daily as needed for mild constipation. 02/07/22   Velna Ochs, MD  lisinopril (ZESTRIL) 10 MG tablet Take 1 tablet (10 mg total) by mouth daily. 09/06/21   Orvis Brill, MD  Menthol, Topical Analgesic, (BIOFREEZE EX) Apply 1 application topically daily as needed (back pain). Patient not taking: Reported on 02/20/2022    [provider]  pantoprazole (PROTONIX) 40 MG tablet Take 1 tablet (40 mg total) by mouth daily. 02/07/22   Velna Ochs, MD  Semaglutide,0.25 or 0.'5MG'$ /DOS, 2 MG/3ML SOPN Inject 0.25 mg into the skin once a week. 01/22/22   Velna Ochs, MD  torsemide (DEMADEX) 20 MG tablet Take 2 tablets (40 mg total) by mouth daily. 02/15/22   Velna Ochs, MD     Allergies:    Allergies  Allergen Reactions   Percocet [Oxycodone-Acetaminophen] Itching    Social History:   Social History   Socioeconomic History   Marital status: Divorced    Spouse name: Not on file   Number of children: Not on file   Years of education: Not on file   Highest education level: Not on file  Occupational History   Not on file  Tobacco Use   Smoking status: Former    Packs/day: 0.25    Years: 6.00    Total pack years: 1.50    Types: Cigarettes   Smokeless tobacco: Never   Tobacco comments:    quit 36 yrs ago  Vaping Use   Vaping Use: Never used  Substance and Sexual Activity   Alcohol use: Yes    Comment: occasionally   Drug use: No   Sexual activity: Not Currently    Birth control/protection: None  Other Topics Concern   Not on file  Social History Narrative   Not on file   Social Determinants of Health   Financial Resource Strain: High Risk (02/14/2022)   Overall Financial Resource Strain (CARDIA)    Difficulty of Paying Living Expenses: Hard   Food Insecurity: Food Insecurity Present (02/14/2022)   Hunger Vital Sign    Worried About Running Out of Food in the Last Year: Sometimes true    Ran Out of Food in the Last Year: Sometimes true  Transportation Needs: No Transportation Needs (02/14/2022)   PRAPARE - Hydrologist (Medical): No    Lack of Transportation (Non-Medical): No  Physical Activity: Inactive (02/14/2022)   Exercise Vital Sign    Days of Exercise per Week: 0 days    Minutes of Exercise per Session: 0 min  Stress: No Stress Concern Present (  02/14/2022)   [place]    Feeling of Stress : Not at all  Social Connections: Moderately Isolated (02/14/2022)   Social Connection and Isolation Panel [NHANES]    Frequency of Communication with Friends and Family: Twice a week    Frequency of Social Gatherings with Friends and Family: Once a week    Attends Religious Services: More than 4 times per year    Active Member of [employer] or Organizations: No    Attends Archivist Meetings: Never    Marital Status: Divorced  Human resources officer Violence: Not At Risk (02/14/2022)   Humiliation, Afraid, Rape, and Kick questionnaire    Fear of Current or Ex-Partner: No    Emotionally Abused: No    Physically Abused: No    Sexually Abused: No    Family History:  *** The patient's family history includes Diabetes in her mother; Heart disease in her father and mother; Kidney disease in her mother; Other in her father; Seizures in her father. There is no history of Breast cancer.    ROS:  Please see the history of present illness.  ***All other ROS reviewed and negative.     Physical Exam/Data:  There were no vitals filed for this visit. No intake or output data in the 24 hours ending 02/20/22 2022    02/20/2022    1:41 PM 02/20/2022    9:43 AM 02/14/2022    8:21 AM  Last 3 Weights  Weight (lbs) 350 lb 351 lb 14.4 oz 366 lb  11.2 oz  Weight (kg) 158.759 kg 159.621 kg 166.334 kg     There is no height or weight on file to calculate BMI.  General:  Well nourished, well developed, in no acute distress, morbid obesity HEENT: normal Neck: JVD +++ Vascular: No carotid bruits; Distal pulses 2+ bilaterally   Cardiac:  normal S1, S2; RRR; no murmur  Lungs:  clear to auscultation bilaterally, no wheezing, rhonchi or rales  Abd: soft, nontender, no hepatomegaly  Ext: no*** edema Musculoskeletal:  No deformities, BUE and BLE strength normal and equal Skin: warm and dry  Neuro:  CNs 2-12 intact, no focal abnormalities noted Psych:  Normal affect    EKG:  The ECG that was done 01/28/22 was personally reviewed and demonstrates sinus rhythm, no conduction abn  Relevant CV Studies: ***  Laboratory Data:  High Sensitivity Troponin:   Recent Labs  Lab 01/28/22 0741 01/28/22 0944  TROPONINIHS 17 17      Chemistry Recent Labs  Lab 02/14/22 0833 02/20/22 1008  NA 139 141  K 4.0 4.3  CL 105 102  CO2 27 29  GLUCOSE 127* 131*  BUN 22 28*  CREATININE 1.44* 1.57*  CALCIUM 8.8* 8.9  GFRNONAA 41* 37*  ANIONGAP 7 10    Recent Labs  Lab 02/14/22 0833 02/14/22 0850 02/20/22 1008  PROT 7.7 7.7 7.5  ALBUMIN 3.2*  --  3.0*  AST 23  --  24  ALT 17  --  17  ALKPHOS 98  --  105  BILITOT 1.1  --  1.2   Lipids No results for input(s): "CHOL", "TRIG", "HDL", "LABVLDL", "LDLCALC", "CHOLHDL" in the last 168 hours. HematologyNo results for input(s): "WBC", "RBC", "HGB", "HCT", "MCV", "MCH", "MCHC", "RDW", "PLT" in the last 168 hours. Thyroid No results for input(s): "TSH", "FREET4" in the last 168 hours. BNP Recent Labs  Lab 02/14/22 0833 02/20/22 1008  BNP 1,332.6* 1,423.2*  DDimer No results for input(s): "DDIMER" in the last 168 hours.   Radiology/Studies:  No results found.   Assessment and Plan:   #Acute on chronic systolic and diastolic heart failure - -TTE on 02/07/22 reported bi-ventricular  dysfunction, LVEF 25-30%, restrictive diastolic dysfunction, mild asymmetric left ventricular hypertrophy of the interior segment, small pericardial effusion -kappa/lambda 2.45, SPE pattern reflected a polyclonal increase in gamma globulin   #CKD stage 3b -Cr on admission slightly worse than her baseline (~1.4), GFR 37  Risk Assessment/Risk Scores:  {Complete the following score calculators/questions to meet required metrics.  Press F2:1}  {Is the patient being seen for unstable angina, ACS, NSTEMI or STEMI?[phone]} {Does this patient have CHF or CHF symptoms?      :[phone]} {Does this patient have ATRIAL FIBRILLATION?[phone]}   Severity of Illness: {Observation/Inpatient:21159}   For questions or updates, please contact [place] Please consult [email or web address] for contact info under     Signed, Laurice Record, MD  02/20/2022 8:22 PM

## 2022-02-20 NOTE — Patient Instructions (Signed)
Medication Instructions:  Continue same medications *If you need a refill on your cardiac medications before your next appointment, please call your pharmacy*   Lab Work: None ordered   Testing/Procedures: None ordered   Follow-Up: At [facility], you and your health needs are our priority.  As part of our continuing mission to provide you with exceptional heart care, we have created designated Provider Care Teams.  These Care Teams include your primary Cardiologist (physician) and Advanced Practice Providers (APPs -  Physician Assistants and Nurse Practitioners) who all work together to provide you with the care you need, when you need it.  We recommend signing up for the patient portal called "MyChart".  Sign up information is provided on this After Visit Summary.  MyChart is used to connect with patients for Virtual Visits (Telemedicine).  Patients are able to view lab/test results, encounter notes, upcoming appointments, etc.  Non-urgent messages can be sent to your provider as well.   To learn more about what you can do with MyChart, go to [email or web address].    Your next appointment:  To Be Determined    The format for your next appointment: Office   Provider:  Dr.Erlandson  To Be Admitted To [facility] will be calling when a bed becomes available   Important Information About [place]

## 2022-02-20 NOTE — Patient Outreach (Signed)
  Care Coordination   In Person Provider Office Visit Note   02/20/2022 Name: GENOVA KINER MRN: [id number] DOB: 1955-09-19  JACQUETTA POLHAMUS is a 66 y.o. year old female who sees Velna Ochs, MD for primary care. I engaged with [name] in the providers office today.  What matters to the patients health and wellness today?  Met briefly with patient in providers office today.  She had to go to lab to have blood drawn and was unable to speak with her again.  Plan to call patient at a later date to discuss care coordination needs.    Goals Addressed   None     SDOH assessments and interventions completed:  No     Care Coordination Interventions Activated:  Yes  Care Coordination Interventions:  Yes, provided   Follow up plan: Follow up call scheduled for 02/21/22    Encounter Outcome:  Pt. Request to Call Back

## 2022-02-21 ENCOUNTER — Encounter (HOSPITAL_COMMUNITY): Payer: Self-pay | Admitting: Internal Medicine

## 2022-02-21 ENCOUNTER — Other Ambulatory Visit: Payer: Self-pay

## 2022-02-21 ENCOUNTER — Other Ambulatory Visit (HOSPITAL_COMMUNITY): Payer: Self-pay

## 2022-02-21 ENCOUNTER — Inpatient Hospital Stay (HOSPITAL_COMMUNITY)
Admission: AD | Admit: 2022-02-21 | Discharge: 2022-02-27 | DRG: 286 | Disposition: A | Discharge status: Home Health | Payer: Medicare Other | Source: Other Acute Inpatient Hospital | Attending: Infectious Diseases | Admitting: Infectious Diseases

## 2022-02-21 DIAGNOSIS — I2510 Atherosclerotic heart disease of native coronary artery without angina pectoris: Secondary | ICD-10-CM | POA: Diagnosis not present

## 2022-02-21 DIAGNOSIS — E785 Hyperlipidemia, unspecified: Secondary | ICD-10-CM | POA: Diagnosis not present

## 2022-02-21 DIAGNOSIS — R1310 Dysphagia, unspecified: Secondary | ICD-10-CM | POA: Diagnosis present

## 2022-02-21 DIAGNOSIS — I2722 Pulmonary hypertension due to left heart disease: Secondary | ICD-10-CM | POA: Diagnosis present

## 2022-02-21 DIAGNOSIS — E1122 Type 2 diabetes mellitus with diabetic chronic kidney disease: Secondary | ICD-10-CM | POA: Diagnosis not present

## 2022-02-21 DIAGNOSIS — N179 Acute kidney failure, unspecified: Secondary | ICD-10-CM | POA: Diagnosis present

## 2022-02-21 DIAGNOSIS — K219 Gastro-esophageal reflux disease without esophagitis: Secondary | ICD-10-CM | POA: Diagnosis present

## 2022-02-21 DIAGNOSIS — I472 Ventricular tachycardia, unspecified: Secondary | ICD-10-CM | POA: Diagnosis present

## 2022-02-21 DIAGNOSIS — K5730 Diverticulosis of large intestine without perforation or abscess without bleeding: Secondary | ICD-10-CM | POA: Diagnosis not present

## 2022-02-21 DIAGNOSIS — N1832 Chronic kidney disease, stage 3b: Secondary | ICD-10-CM | POA: Diagnosis not present

## 2022-02-21 DIAGNOSIS — I700 Atherosclerosis of aorta: Secondary | ICD-10-CM | POA: Diagnosis not present

## 2022-02-21 DIAGNOSIS — Z7984 Long term (current) use of oral hypoglycemic drugs: Secondary | ICD-10-CM | POA: Diagnosis not present

## 2022-02-21 DIAGNOSIS — E11319 Type 2 diabetes mellitus with unspecified diabetic retinopathy without macular edema: Secondary | ICD-10-CM | POA: Diagnosis not present

## 2022-02-21 DIAGNOSIS — I428 Other cardiomyopathies: Secondary | ICD-10-CM | POA: Diagnosis present

## 2022-02-21 DIAGNOSIS — Z7982 Long term (current) use of aspirin: Secondary | ICD-10-CM

## 2022-02-21 DIAGNOSIS — I5020 Unspecified systolic (congestive) heart failure: Secondary | ICD-10-CM | POA: Insufficient documentation

## 2022-02-21 DIAGNOSIS — I5023 Acute on chronic systolic (congestive) heart failure: Secondary | ICD-10-CM | POA: Diagnosis present

## 2022-02-21 DIAGNOSIS — E119 Type 2 diabetes mellitus without complications: Secondary | ICD-10-CM | POA: Diagnosis not present

## 2022-02-21 DIAGNOSIS — R188 Other ascites: Secondary | ICD-10-CM | POA: Diagnosis not present

## 2022-02-21 DIAGNOSIS — Z79899 Other long term (current) drug therapy: Secondary | ICD-10-CM

## 2022-02-21 DIAGNOSIS — Z791 Long term (current) use of non-steroidal anti-inflammatories (NSAID): Secondary | ICD-10-CM

## 2022-02-21 DIAGNOSIS — I5083 High output heart failure: Secondary | ICD-10-CM | POA: Diagnosis not present

## 2022-02-21 DIAGNOSIS — I5021 Acute systolic (congestive) heart failure: Principal | ICD-10-CM | POA: Diagnosis present

## 2022-02-21 DIAGNOSIS — I5022 Chronic systolic (congestive) heart failure: Secondary | ICD-10-CM | POA: Diagnosis present

## 2022-02-21 DIAGNOSIS — Z8249 Family history of ischemic heart disease and other diseases of the circulatory system: Secondary | ICD-10-CM

## 2022-02-21 DIAGNOSIS — Z833 Family history of diabetes mellitus: Secondary | ICD-10-CM

## 2022-02-21 DIAGNOSIS — Z841 Family history of disorders of kidney and ureter: Secondary | ICD-10-CM

## 2022-02-21 DIAGNOSIS — E7849 Other hyperlipidemia: Secondary | ICD-10-CM | POA: Diagnosis not present

## 2022-02-21 DIAGNOSIS — I130 Hypertensive heart and chronic kidney disease with heart failure and stage 1 through stage 4 chronic kidney disease, or unspecified chronic kidney disease: Secondary | ICD-10-CM | POA: Diagnosis not present

## 2022-02-21 DIAGNOSIS — K7460 Unspecified cirrhosis of liver: Secondary | ICD-10-CM | POA: Diagnosis present

## 2022-02-21 DIAGNOSIS — I2721 Secondary pulmonary arterial hypertension: Secondary | ICD-10-CM | POA: Diagnosis present

## 2022-02-21 DIAGNOSIS — I10 Essential (primary) hypertension: Secondary | ICD-10-CM | POA: Diagnosis present

## 2022-02-21 DIAGNOSIS — Z886 Allergy status to analgesic agent status: Secondary | ICD-10-CM

## 2022-02-21 DIAGNOSIS — D709 Neutropenia, unspecified: Secondary | ICD-10-CM | POA: Diagnosis not present

## 2022-02-21 DIAGNOSIS — K635 Polyp of colon: Secondary | ICD-10-CM | POA: Diagnosis present

## 2022-02-21 DIAGNOSIS — K8020 Calculus of gallbladder without cholecystitis without obstruction: Secondary | ICD-10-CM | POA: Diagnosis present

## 2022-02-21 DIAGNOSIS — F419 Anxiety disorder, unspecified: Secondary | ICD-10-CM | POA: Diagnosis present

## 2022-02-21 DIAGNOSIS — D509 Iron deficiency anemia, unspecified: Secondary | ICD-10-CM | POA: Diagnosis present

## 2022-02-21 DIAGNOSIS — R531 Weakness: Secondary | ICD-10-CM

## 2022-02-21 DIAGNOSIS — K5900 Constipation, unspecified: Secondary | ICD-10-CM | POA: Diagnosis present

## 2022-02-21 DIAGNOSIS — R791 Abnormal coagulation profile: Secondary | ICD-10-CM | POA: Diagnosis present

## 2022-02-21 DIAGNOSIS — I5082 Biventricular heart failure: Secondary | ICD-10-CM | POA: Diagnosis present

## 2022-02-21 DIAGNOSIS — D500 Iron deficiency anemia secondary to blood loss (chronic): Secondary | ICD-10-CM

## 2022-02-21 DIAGNOSIS — Z6841 Body Mass Index (BMI) 40.0 and over, adult: Secondary | ICD-10-CM

## 2022-02-21 DIAGNOSIS — I5043 Acute on chronic combined systolic (congestive) and diastolic (congestive) heart failure: Secondary | ICD-10-CM

## 2022-02-21 DIAGNOSIS — E876 Hypokalemia: Secondary | ICD-10-CM | POA: Diagnosis present

## 2022-02-21 DIAGNOSIS — I5041 Acute combined systolic (congestive) and diastolic (congestive) heart failure: Secondary | ICD-10-CM | POA: Diagnosis not present

## 2022-02-21 DIAGNOSIS — I110 Hypertensive heart disease with heart failure: Secondary | ICD-10-CM | POA: Diagnosis not present

## 2022-02-21 DIAGNOSIS — Z87891 Personal history of nicotine dependence: Secondary | ICD-10-CM | POA: Diagnosis not present

## 2022-02-21 LAB — GLUCOSE, CAPILLARY
Glucose-Capillary: 148 mg/dL — ABNORMAL HIGH (ref 70–99)
Glucose-Capillary: 138 mg/dL — ABNORMAL HIGH (ref 70–99)

## 2022-02-21 LAB — HEPATIC FUNCTION PANEL
ALT: 17 U/L (ref 0–44)
AST: 27 U/L (ref 15–41)
Albumin: 3.6 g/dL (ref 3.5–5.0)
Alkaline Phosphatase: 113 U/L (ref 38–126)
Bilirubin, Direct: 0.5 mg/dL — ABNORMAL HIGH (ref 0.0–0.2)
Indirect Bilirubin: 1.1 mg/dL — ABNORMAL HIGH (ref 0.3–0.9)
Total Bilirubin: 1.6 mg/dL — ABNORMAL HIGH (ref 0.3–1.2)
Total Protein: 8.7 g/dL — ABNORMAL HIGH (ref 6.5–8.1)

## 2022-02-21 LAB — CBC
HCT: 38.4 % (ref 36.0–46.0)
Hemoglobin: 11.2 g/dL — ABNORMAL LOW (ref 12.0–15.0)
MCH: 24.2 pg — ABNORMAL LOW (ref 26.0–34.0)
MCHC: 29.2 g/dL — ABNORMAL LOW (ref 30.0–36.0)
MCV: 82.9 fL (ref 80.0–100.0)
Platelets: 211 10*3/uL (ref 150–400)
RBC: 4.63 MIL/uL (ref 3.87–5.11)
RDW: 17 % — ABNORMAL HIGH (ref 11.5–15.5)
WBC: 3 10*3/uL — ABNORMAL LOW (ref 4.0–10.5)
nRBC: 0 % (ref 0.0–0.2)

## 2022-02-21 LAB — BASIC METABOLIC PANEL
Anion gap: 9 (ref 5–15)
BUN: 25 mg/dL — ABNORMAL HIGH (ref 8–23)
CO2: 29 mmol/L (ref 22–32)
Calcium: 9 mg/dL (ref 8.9–10.3)
Chloride: 102 mmol/L (ref 98–111)
Creatinine, Ser: 1.52 mg/dL — ABNORMAL HIGH (ref 0.44–1.00)
GFR, Estimated: 38 mL/min — ABNORMAL LOW (ref >60)
Glucose, Bld: 121 mg/dL — ABNORMAL HIGH (ref 70–99)
Potassium: 3.8 mmol/L (ref 3.5–5.1)
Sodium: 140 mmol/L (ref 135–145)

## 2022-02-21 LAB — HIV ANTIBODY (ROUTINE TESTING W REFLEX)
HIV Screen 4th Generation wRfx: NONREACTIVE
HIV Screen 4th Generation wRfx: NONREACTIVE

## 2022-02-21 LAB — MAGNESIUM: Magnesium: 1.9 mg/dL (ref 1.7–2.4)

## 2022-02-21 MED ORDER — SUCRALFATE 1 GM/10ML PO SUSP
1.0000 g | Freq: Three times a day (TID) | ORAL | Status: DC
  Administered 2022-02-21: 1 g via ORAL
  Filled 2022-02-21: qty 10

## 2022-02-21 MED ORDER — FUROSEMIDE 10 MG/ML IJ SOLN
80.0000 mg | Freq: Two times a day (BID) | INTRAMUSCULAR | Status: DC
  Administered 2022-02-21 – 2022-02-22 (×3): 80 mg via INTRAVENOUS
  Filled 2022-02-21 (×3): qty 8

## 2022-02-21 MED ORDER — INSULIN ASPART 100 UNIT/ML IJ SOLN: 0.0000 [IU] | Freq: Every day | INTRAMUSCULAR | Status: DC

## 2022-02-21 MED ORDER — INSULIN ASPART 100 UNIT/ML IJ SOLN
0.0000 [IU] | Freq: Three times a day (TID) | INTRAMUSCULAR | Status: DC
  Administered 2022-02-21 – 2022-02-24 (×6): 1 [IU] via SUBCUTANEOUS
  Administered 2022-02-25: 2 [IU] via SUBCUTANEOUS
  Administered 2022-02-25: 1 [IU] via SUBCUTANEOUS
  Administered 2022-02-26 – 2022-02-27 (×3): 2 [IU] via SUBCUTANEOUS

## 2022-02-21 MED ORDER — ENOXAPARIN SODIUM 80 MG/0.8ML IJ SOSY
80.0000 mg | PREFILLED_SYRINGE | INTRAMUSCULAR | Status: DC
  Administered 2022-02-21 – 2022-02-22 (×2): 80 mg via SUBCUTANEOUS
  Filled 2022-02-21 (×2): qty 0.8

## 2022-02-21 MED ORDER — MENTHOL (TOPICAL ANALGESIC) 4 % EX GEL: Freq: Every day | CUTANEOUS | Status: DC | PRN

## 2022-02-21 MED ORDER — SODIUM CHLORIDE 0.9 % IV SOLN
125.0000 mg | Freq: Every day | INTRAVENOUS | Status: DC
  Administered 2022-02-21: 125 mg via INTRAVENOUS
  Filled 2022-02-21 (×2): qty 10

## 2022-02-21 MED ORDER — PANTOPRAZOLE SODIUM 40 MG PO TBEC
40.0000 mg | DELAYED_RELEASE_TABLET | Freq: Every day | ORAL | Status: DC
  Administered 2022-02-21 – 2022-02-27 (×7): 40 mg via ORAL
  Filled 2022-02-21 (×7): qty 1

## 2022-02-21 MED ORDER — SENNOSIDES-DOCUSATE SODIUM 8.6-50 MG PO TABS
1.0000 | ORAL_TABLET | Freq: Every day | ORAL | Status: DC
  Administered 2022-02-21 – 2022-02-27 (×7): 1 via ORAL
  Filled 2022-02-21 (×7): qty 1

## 2022-02-21 MED ORDER — BISACODYL 10 MG RE SUPP: 10.0000 mg | Freq: Every day | RECTAL | Status: DC | PRN

## 2022-02-21 MED ORDER — ONDANSETRON HCL 4 MG/2ML IJ SOLN: 4.0000 mg | Freq: Four times a day (QID) | INTRAMUSCULAR | Status: DC | PRN

## 2022-02-21 MED ORDER — LIVING BETTER WITH HEART FAILURE BOOK: Freq: Once | Status: AC

## 2022-02-21 MED ORDER — RIVAROXABAN 10 MG PO TABS
10.0000 mg | ORAL_TABLET | Freq: Every day | ORAL | Status: DC
  Filled 2022-02-21: qty 1

## 2022-02-21 MED ORDER — ACETAMINOPHEN 325 MG PO TABS
650.0000 mg | ORAL_TABLET | Freq: Four times a day (QID) | ORAL | Status: DC
  Administered 2022-02-21 – 2022-02-22 (×3): 650 mg via ORAL
  Filled 2022-02-21 (×3): qty 2

## 2022-02-21 MED ORDER — ASPIRIN 81 MG PO TBEC
81.0000 mg | DELAYED_RELEASE_TABLET | Freq: Every day | ORAL | Status: DC
  Administered 2022-02-22 – 2022-02-27 (×5): 81 mg via ORAL
  Filled 2022-02-21 (×5): qty 1

## 2022-02-21 MED ORDER — ATORVASTATIN CALCIUM 40 MG PO TABS
40.0000 mg | ORAL_TABLET | Freq: Every day | ORAL | Status: DC
  Administered 2022-02-21 – 2022-02-26 (×6): 40 mg via ORAL
  Filled 2022-02-21 (×6): qty 1

## 2022-02-21 NOTE — Progress Notes (Signed)
   Heart Failure Stewardship Pharmacist Progress Note   PCP: Velna Ochs, MD PCP-Cardiologist: Janina Mayo, MD    HPI:  66 yo F with PMH of GERD, cirrhosis, HTN, DM, obesity, and HLD.  She was seen in the ED on 01/28/2022 with SOB for 1 weeks and orthopnea. Signs of CHF. She also had leg swelling. In the ED she also reported recent trip to [place]. She underwent CT PE. No PE. Liver c/f possible cirrhosis. She was given a dose of lasix in the ED and recommended to increase it for 2 days. They messaged her PCP regarding an upcoming visit. Arranged from FU with cardiology. No plan for admission or further w/u.   She presented to clinic on 11/8 for the above symptoms. She had an echo done showing systolic BI-V failure EF 36-64%, moderately dilated RV, grade III diastolic dysfunction. Small pericardial effusion. She had elevated Kappa/Lambda ratio 2.45. BNP 1423.  Came back for follow up on 11/21. Reports 15 lb down after diuresing with torsemide but still had JVD, dyspnea, and significant LE edema. She was sent for direct admission to the hospital for management.   Scheduled for [facility] on 11/24. May need cMRI afterward.   Current HF Medications: Diuretic: furosemide 80 mg IV BID  Prior to admission HF Medications: Diuretic: torsemide 40 mg daily ACE/ARB/ARNI: lisinopril 10 mg daily *not taking [place]  Pertinent Lab Values: Serum creatinine 1.57, BUN 28, Potassium 4.3, Sodium 141, BNP 1423.2, A1c 7.8  TSAT 7, Ferritin 74  Vital Signs: Weight: 345 lbs (admission weight: 345 lbs) Blood pressure: 140/70s  Heart rate: 80s  I/O: not yet documented  Medication Assistance / Insurance Benefits Check: Does the patient have prescription insurance?  Yes Type of insurance plan: UHC Medicare  Does the patient qualify for medication assistance through manufacturers or grants?   Pending Eligible grants and/or patient assistance programs: pending Medication assistance applications in  progress: none  Medication assistance applications approved: none Approved medication assistance renewals will be completed by: pending  Outpatient Pharmacy:  Prior to admission outpatient pharmacy: [facility] Is the patient willing to use [facility] pharmacy at discharge? Yes Is the patient willing to transition their outpatient pharmacy to utilize a [facility] outpatient pharmacy?   Pending    Assessment: 1. Acute on chronic systolic CHF (LVEF 40-34%), ischemic evaluation pending. NYHA class III symptoms. - Continue furosemide 80 mg IV BID. Strict I/Os and daily weights. Keep K>4 and check magnesium with AM labs. - Consider starting carvedilol once euvolemic, will need to be cautious with biv failure. Could use digoxin in the interim. - Was on lisinopril PTA, hold today and consider transitioning to [facility]. - Consider adding spironolactone after cath - Consider restarting Farxiga 10 mg daily today - Iron stores low, if giving Feraheme, need to wait until after cMRI   Plan: 1) Medication changes recommended at this time: - Restart Farxiga 10 mg daily  2) Patient assistance: Delene Loll copay $47 - Farxiga/Jardiance copay $47  3)  Education  - To be completed prior to discharge  Kerby Nora, PharmD, BCPS Heart Failure Stewardship Pharmacist Phone [phone]

## 2022-02-21 NOTE — Progress Notes (Signed)
Patient has arrived to 3E05 as a direct admit. She is alert and oriented. Vitals completed. Complains of abdominal pain 8/10. Cardiology team made aware of her arrival.

## 2022-02-21 NOTE — Consult Note (Addendum)
Cardiology Consultation   Patient ID: [name] MRN: [id number]; DOB: November 26, 1955  Admit date: 02/21/2022 Date of Consult: 02/21/2022  PCP:  Velna Ochs, MD   [facility] Cardiologist:  Janina Mayo, MD     Patient Profile:   Tina Lara is a 66 y.o. female with a hx of GERD, cirrhosis, HTN, DM, obesity, HLD who is being seen 02/21/2022 for the evaluation of CHF at the request of Dr. Philipp Ovens.  History of Present Illness:   Tina Lara is a 66 yo female with PMH noted above. Initially seen in the ED on 10/29 for shortness of breath and orthopnea. CT angio negative for PE, possible cirrhosis. Given lasix in the ED and DC'ed.  She is followed through the internal medicine clinic.  She presented to their office on 11/8 with symptoms of abdominal pain, dyspnea and lower extremity edema.  She was set up for an outpatient echocardiogram which showed an LVEF of 25 to 30%, global hypokinesis, grade 3 diastolic dysfunction, moderately reduced RV with mild enlargement, mildly elevated pulmonary artery pressure, moderate to severe TR. Kappa free light chain 66.9, Lambda free light chain 27.3, kappa/lambda ratio 2.45, IgG 2494, no M-spike. Placed on torsemide '40mg'$  daily. Offered for inpatient admission with IV diuresis but the patient declined. Scheduled for urgent outpatient cardiology visit. She was seen back in the internal medicine clinic on 11/21 with weights down 14.8lbs. Seen later that day in the cardiology office with Dr. Phineas Inches. Noted to be significantly volume overloaded on exam. It was recommended that she be direct admitted for CHF management with consideration for cMRI/PYP scan.   Labs 11/21 Na+ 141, K+ 4.3, Cr 1.57, albumin 3.0, BNP 1423  Iron 27, UIBC 387, Ferritin 74, Iron sat 7  In talking with the patient, she reports her weights have actually dropped an additional 8lbs since being seen in the clinic yesterday. Main complaint is abd  pain today, some nausea but no vomiting/diarrhea. Clearly remains significantly volume overloaded with LE edema and JVD.   In regards to family hx, reports mother having a stent later in life. No hx of sudden cardiac death, PPM or ICD placement. She denies any anginal symptoms, but has been limited in her physical activity 2/2 dyspnea over the past couple of months.    Past Medical History:  Diagnosis Date   Anxiety    Cataract    NS OD   Chest pain 28/41/3244   Complication of anesthesia    Per patient "needs a lot of anesthesia w/surgery" ,I had toruble with a spinal block not taking "   Diabetes mellitus without complication (HCC)    Type 2   Diabetic retinopathy (HCC)    BDR   Full dentures    GERD (gastroesophageal reflux disease)    Heart failure ([place])    History of surgery on arm    right arm broken   Hyperlipidemia    Hypertension    never diagnosed by physician per pt   Macular degeneration    Obesity     Past Surgical History:  Procedure Laterality Date   BIOPSY  09/30/2017   Procedure: BIOPSY;  Surgeon: Jerene Bears, MD;  Location: [facility];  Service: Gastroenterology;;   CERVICAL CONIZATION W/BX N/A 09/10/2017   Procedure: CONIZATION CERVIX WITH BIOPSY - COLD KNIFE;  Surgeon: Emily Filbert, MD;  Location: [facility];  Service: Gynecology;  Laterality: N/A;   [place]   x  1   CHOLECYSTECTOMY     gall stones   COLONOSCOPY WITH PROPOFOL N/A 09/30/2017   Procedure: COLONOSCOPY WITH PROPOFOL;  Surgeon: Jerene Bears, MD;  Location: [facility];  Service: Gastroenterology;  Laterality: N/A;   DILATION AND CURETTAGE OF UTERUS N/A 09/10/2017   Procedure: DILATATION AND CURETTAGE;  Surgeon: Emily Filbert, MD;  Location: [facility];  Service: Gynecology;  Laterality: N/A;   MULTIPLE TOOTH EXTRACTIONS     dentures upper and lowers   POLYPECTOMY  09/30/2017   Procedure: POLYPECTOMY;  Surgeon: Jerene Bears, MD;  Location: [facility];  Service: Gastroenterology;;    TONSILLECTOMY       Home Medications:  Prior to Admission medications   Medication Sig Start Date End Date Taking? Authorizing Provider  acetaminophen (TYLENOL) 500 MG tablet Take 1,500 mg by mouth every 6 (six) hours as needed for moderate pain or headache.     [provider]  aspirin 81 MG EC tablet Take 1 tablet (81 mg total) by mouth daily. Patient not taking: Reported on 02/20/2022 08/16/16   Shela Leff, MD  atorvastatin (LIPITOR) 40 MG tablet Take 1 tablet (40 mg total) by mouth daily. Patient not taking: Reported on 02/20/2022 09/06/21 09/06/22  Orvis Brill, MD  dapagliflozin propanediol (FARXIGA) 10 MG TABS tablet Take 1 tablet (10 mg total) by mouth daily before breakfast. Patient not taking: Reported on 02/20/2022 09/06/21   Orvis Brill, MD  dicyclomine (BENTYL) 20 MG tablet Take 1 tablet (20 mg total) by mouth 2 (two) times daily as needed for spasms (abdominal pain). 02/07/22   Velna Ochs, MD  ferrous sulfate 325 (65 FE) MG tablet Take 1 tablet (325 mg total) by mouth daily. 09/06/21 02/20/22  Orvis Brill, MD  Hypromellose (ARTIFICIAL TEARS OP) Place 1 drop into both eyes daily as needed (for dry eyes). Patient not taking: Reported on 02/20/2022    [provider]  ibuprofen (ADVIL,MOTRIN) 200 MG tablet Take 400 mg by mouth every 6 (six) hours as needed for headache or moderate pain.     [provider]  lactulose (CHRONULAC) 10 GM/15ML solution Take 15 mLs (10 g total) by mouth 2 (two) times daily as needed for mild constipation. 02/07/22   Velna Ochs, MD  lisinopril (ZESTRIL) 10 MG tablet Take 1 tablet (10 mg total) by mouth daily. 09/06/21   Orvis Brill, MD  Menthol, Topical Analgesic, (BIOFREEZE EX) Apply 1 application topically daily as needed (back pain). Patient not taking: Reported on 02/20/2022    [provider]  pantoprazole (PROTONIX) 40 MG tablet Take 1 tablet (40 mg total) by mouth daily.  02/07/22   Velna Ochs, MD  Semaglutide,0.25 or 0.'5MG'$ /DOS, 2 MG/3ML SOPN Inject 0.25 mg into the skin once a week. 01/22/22   Velna Ochs, MD  torsemide (DEMADEX) 20 MG tablet Take 2 tablets (40 mg total) by mouth daily. 02/15/22   Velna Ochs, MD    Inpatient Medications: Scheduled Meds:  Continuous Infusions:  PRN Meds:   Allergies:    Allergies  Allergen Reactions   Percocet [Oxycodone-Acetaminophen] Itching    Social History:   Social History   Socioeconomic History   Marital status: Divorced    Spouse name: Not on file   Number of children: Not on file   Years of education: Not on file   Highest education level: Not on file  Occupational History   Not on file  Tobacco Use   Smoking status: Former  Packs/day: 0.25    Years: 6.00    Total pack years: 1.50    Types: Cigarettes   Smokeless tobacco: Never   Tobacco comments:    quit 36 yrs ago  Vaping Use   Vaping Use: Never used  Substance and Sexual Activity   Alcohol use: Yes    Comment: occasionally   Drug use: No   Sexual activity: Not Currently    Birth control/protection: None  Other Topics Concern   Not on file  Social History Narrative   Not on file   Social Determinants of Health   Financial Resource Strain: High Risk (02/14/2022)   Overall Financial Resource Strain (CARDIA)    Difficulty of Paying Living Expenses: Hard  Food Insecurity: No Food Insecurity (02/21/2022)   Hunger Vital Sign    Worried About Running Out of Food in the Last Year: Never true    Ran Out of Food in the Last Year: Never true  Recent Concern: [place] Present (02/14/2022)   Hunger Vital Sign    Worried About Running Out of Food in the Last Year: Sometimes true    Ran Out of Food in the Last Year: Sometimes true  Transportation Needs: No Transportation Needs (02/21/2022)   PRAPARE - Hydrologist (Medical): No    Lack of Transportation  (Non-Medical): No  Physical Activity: Inactive (02/14/2022)   Exercise Vital Sign    Days of Exercise per Week: 0 days    Minutes of Exercise per Session: 0 min  Stress: No Stress Concern Present (02/14/2022)   [place]    Feeling of Stress : Not at all  Social Connections: Moderately Isolated (02/14/2022)   Social Connection and Isolation Panel [NHANES]    Frequency of Communication with Friends and Family: Twice a week    Frequency of Social Gatherings with Friends and Family: Once a week    Attends Religious Services: More than 4 times per year    Active Member of [employer] or Organizations: No    Attends Archivist Meetings: Never    Marital Status: Divorced  Human resources officer Violence: Not At Risk (02/21/2022)   Humiliation, Afraid, Rape, and Kick questionnaire    Fear of Current or Ex-Partner: No    Emotionally Abused: No    Physically Abused: No    Sexually Abused: No    Family History:    Family History  Problem Relation Age of Onset   Kidney disease Mother    Heart disease Mother    Diabetes Mother    Seizures Father    Heart disease Father    Other Father    Breast cancer Neg Hx      ROS:  Please see the history of present illness.   All other ROS reviewed and negative.     Physical Exam/Data:   Vitals:   02/21/22 0853  BP: (!) 142/78  Pulse: 87  Resp: 18  Temp: 97.9 F (36.6 C)  TempSrc: Oral  SpO2: 97%  Weight: (!) 156.7 kg  Height: '5\' 9"'$  (1.753 m)   No intake or output data in the 24 hours ending 02/21/22 1118    02/21/2022    8:53 AM 02/20/2022    1:41 PM 02/20/2022    9:43 AM  Last 3 Weights  Weight (lbs) 345 lb 7.4 oz 350 lb 351 lb 14.4 oz  Weight (kg) 156.7 kg 158.759 kg 159.621 kg  Body mass index is 51.02 kg/m.  General:  Morbidly obese female, sitting up on side of bed. No distress HEENT: normal Neck: + JVD Vascular: No carotid bruits; Distal pulses 2+  bilaterally Cardiac:  normal S1, S2; RRR; soft systolic murmur  Lungs: Diminished in bases Abd: protuberate, generalized tenderness to palpation Ext: 2-3+ pitting bilateral LE edema Musculoskeletal:  No deformities, BUE and BLE strength normal and equal Skin: warm and dry  Neuro:  CNs 2-12 intact, no focal abnormalities noted Psych:  Normal affect   EKG:  The EKG was personally reviewed and demonstrates: Sinus Rhythm, 93 bpm PAC, notched p wave Telemetry:  Telemetry was personally reviewed and demonstrates:  Sinus Rhythm  Relevant CV Studies:  Echo: 02/07/22  IMPRESSIONS     1. Left ventricular ejection fraction, by estimation, is 25 to 30%. The  left ventricle has severely decreased function. The left ventricle  demonstrates global hypokinesis. There is mild asymmetric left ventricular  hypertrophy of the inferior segment.  Left ventricular diastolic parameters are consistent with Grade III  diastolic dysfunction (restrictive). Elevated left ventricular  end-diastolic pressure.   2. Right ventricular systolic function is moderately reduced. The right  ventricular size is mildly enlarged. There is mildly elevated pulmonary  artery systolic pressure.   3. The mitral valve is normal in structure. No evidence of mitral valve  regurgitation. No evidence of mitral stenosis.   4. Tricuspid valve regurgitation is moderate to severe.   5. The aortic valve is normal in structure. Aortic valve regurgitation is  mild. Aortic valve sclerosis/calcification is present, without any  evidence of aortic stenosis.   6. There is mild dilatation of the ascending aorta, measuring 37 mm.   7. The inferior vena cava is normal in size with greater than 50%  respiratory variability, suggesting right atrial pressure of 3 mmHg.   FINDINGS   Left Ventricle: Left ventricular ejection fraction, by estimation, is 25  to 30%. The left ventricle has severely decreased function. The left  ventricle  demonstrates global hypokinesis. Definity contrast agent was  given IV to delineate the left  ventricular endocardial borders. The left ventricular internal cavity size  was normal in size. There is mild asymmetric left ventricular hypertrophy  of the inferior segment. Left ventricular diastolic parameters are  consistent with Grade III diastolic  dysfunction (restrictive). Elevated left ventricular end-diastolic  pressure.   Right Ventricle: The right ventricular size is mildly enlarged. No  increase in right ventricular wall thickness. Right ventricular systolic  function is moderately reduced. There is mildly elevated pulmonary artery  systolic pressure. The tricuspid  regurgitant velocity is 2.71 m/s, and with an assumed right atrial  pressure of 8 mmHg, the estimated right ventricular systolic pressure is  25.0 mmHg.   Left Atrium: Left atrial size was normal in size.   Right Atrium: Right atrial size was normal in size.   Pericardium: There is no evidence of pericardial effusion.   Mitral Valve: The mitral valve is normal in structure. No evidence of  mitral valve regurgitation. No evidence of mitral valve stenosis. MV peak  gradient, 4.2 mmHg. The mean mitral valve gradient is 2.0 mmHg.   Tricuspid Valve: The tricuspid valve is normal in structure. Tricuspid  valve regurgitation is moderate to severe. No evidence of tricuspid  stenosis.   Aortic Valve: The aortic valve is normal in structure. Aortic valve  regurgitation is mild. Aortic regurgitation PHT measures 314 msec. Aortic  valve sclerosis/calcification is present, without any evidence of  aortic  stenosis.   Pulmonic Valve: The pulmonic valve was normal in structure. Pulmonic valve  regurgitation is not visualized. No evidence of pulmonic stenosis.   Aorta: There is mild dilatation of the ascending aorta, measuring 37 mm.   Venous: The inferior vena cava is normal in size with greater than 50%  respiratory  variability, suggesting right atrial pressure of 3 mmHg.   IAS/Shunts: No atrial level shunt detected by color flow Doppler.    Laboratory Data:  High Sensitivity Troponin:   Recent Labs  Lab 01/28/22 0741 01/28/22 0944  TROPONINIHS 17 17     Chemistry Recent Labs  Lab 02/20/22 1008  NA 141  K 4.3  CL 102  CO2 29  GLUCOSE 131*  BUN 28*  CREATININE 1.57*  CALCIUM 8.9  GFRNONAA 37*  ANIONGAP 10    Recent Labs  Lab 02/20/22 1008  PROT 7.5  ALBUMIN 3.0*  AST 24  ALT 17  ALKPHOS 105  BILITOT 1.2   Lipids No results for input(s): "CHOL", "TRIG", "HDL", "LABVLDL", "LDLCALC", "CHOLHDL" in the last 168 hours.  HematologyNo results for input(s): "WBC", "RBC", "HGB", "HCT", "MCV", "MCH", "MCHC", "RDW", "PLT" in the last 168 hours. Thyroid No results for input(s): "TSH", "FREET4" in the last 168 hours.  BNP Recent Labs  Lab 02/20/22 1008  BNP 1,423.2*    DDimer No results for input(s): "DDIMER" in the last 168 hours.   Radiology/Studies:  No results found.   Assessment and Plan:   [name] is a 66 y.o. female with a hx of GERD, cirrhosis, HTN, DM, obesity, HLD who is being seen 02/21/2022 for the evaluation of CHF at the request of Dr. Philipp Ovens.  BiV Heart failure GIII DD -- presents for direct admission with progressive dyspnea, LE edema and orthopnea for several months -- outpt echo showed LVEF of 25-30%, global hypokinesis, GIIIDD, moderately reduced RV with mild enlargement. BNP 1423. Has been responding decently to torsemide as an outpatient but remains significantly volume overloaded  -- start IV lasix '80mg'$  BID -- will need R/LHC, plan for 11/24. Further work up pending cath with possible cMRI -- GDMT: continue farxiga, pending renal function add spiro. Has been on lisinopril as an outpatient, hold with plans to transition to [facility].   Shared Decision Making/Informed Consent{ The risks [stroke (1 in 1000), death (1 in 1000), kidney failure  [usually temporary] (1 in 500), bleeding (1 in 200), allergic reaction [possibly serious] (1 in 200)], benefits (diagnostic support and management of coronary artery disease) and alternatives of a cardiac catheterization were discussed in detail with Ms. Behe and she is willing to proceed.  HTN -- blood pressures stable -- as above, holding lisinopril with plans for Entresto likely post cath  HLD -- continue atorvastatin   DM -- Hgb A1c 7.8 (10/23) -- continue farxiga -- SSI while inpatient  IDA -- Iron 27, Iron sat 7, Hgb 10.7 (11/8) -- follow up CBC -- would benefit from IV iron  Abd pain Cirrhosis -- reports ongoing abd pain for the past couple of weeks. LFTs were normal 11/21. Has been on bentyl, protonix -- question whether CHF contributing? CT abd/pelvis with suggestion of cirrhosis -- work up per primary team  Obesity -- diet/lifestyle modifications  Risk Assessment/Risk Scores:   New York Heart Association (NYHA) Functional Class NYHA Class III   For questions or updates, please contact [place] Please consult [email or web address] for contact info under    Signed, Reino Bellis, NP  02/21/2022  11:18 AM  I have seen and examined the patient along with Reino Bellis, NP .  I have reviewed the chart, notes and new data.  I agree with PA/NP's note.  Key new complaints: Improving symptoms of biventricular heart failure with diuresis.  The orthopneic and markedly edematous Key examination changes: Superobesity limits the physical exam but she does have evidence of jugular venous distention and substantial lower extremity edema.  Tricuspid regurgitation murmur is relatively mild, 1-2/6, less than expected from echo description of severe tricuspid regurgitation; no diastolic murmurs heard. Key new findings / data: ECG shows normal sinus rhythm, she does not have low voltage when we take into account her degree of obesity.  There is evidence of left atrial  abnormality.  There are no ischemic changes. 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  PLAN: Recommend right and left heart catheterization after she has had some additional diuresis.  Tentatively scheduled for Friday. The procedure was discussed in detail and she provided consent. After we have achieved better diuresis and she is close to "dry weight" we will start Ferne Coe or Farxiga and carvedilol.  Would leave spironolactone for later, after we see response of her renal function to diuretics and Entresto. Recheck LVEF in 90 days.  Consider ICD if EF remains less than 35%.  He does have restrictive mitral inflow filling and a severely that her left atrium, but otherwise the presentation is not typical for amyloidosis; there is only mild left ventricular hypertrophy and there is no significant loss of voltage on the ECG.  No evidence of monoclonal spike on her immunoelectrophoresis.  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  Sanda Klein, MD, [place] [phone] 02/21/2022, 1:23 PM

## 2022-02-21 NOTE — H&P (View-Only) (Signed)
Cardiology Consultation   Patient ID: Tina Lara MRN: [id number]; DOB: [birth year]  Admit date: 02/21/2022 Date of Consult: 02/21/2022  PCP:  Velna Ochs, MD   [place] Providers Cardiologist:  Janina Mayo, MD     Patient Profile:   Tina Lara is a 66 y.o. female with a hx of GERD, cirrhosis, HTN, DM, obesity, HLD who is being seen 02/21/2022 for the evaluation of CHF at the request of Dr. Philipp Ovens.  History of Present Illness:   Tina Lara is a 66 yo female with PMH noted above. Initially seen in the ED on 10/29 for shortness of breath and orthopnea. CT angio negative for PE, possible cirrhosis. Given lasix in the ED and DC'ed.  She is followed through the internal medicine clinic.  She presented to their office on 11/8 with symptoms of abdominal pain, dyspnea and lower extremity edema.  She was set up for an outpatient echocardiogram which showed an LVEF of 25 to 30%, global hypokinesis, grade 3 diastolic dysfunction, moderately reduced RV with mild enlargement, mildly elevated pulmonary artery pressure, moderate to severe TR. Kappa free light chain 66.9, Lambda free light chain 27.3, kappa/lambda ratio 2.45, IgG 2494, no M-spike. Placed on torsemide '40mg'$  daily. Offered for inpatient admission with IV diuresis but the patient declined. Scheduled for urgent outpatient cardiology visit. She was seen back in the internal medicine clinic on 11/21 with weights down 14.8lbs. Seen later that day in the cardiology office with Dr. Phineas Inches. Noted to be significantly volume overloaded on exam. It was recommended that she be direct admitted for CHF management with consideration for cMRI/PYP scan.   Labs 11/21 Na+ 141, K+ 4.3, Cr 1.57, albumin 3.0, BNP 1423  Iron 27, UIBC 387, Ferritin 74, Iron sat 7  In talking with the patient, she reports her weights have actually dropped an additional 8lbs since being seen in the clinic yesterday. Main complaint is abd  pain today, some nausea but no vomiting/diarrhea. Clearly remains significantly volume overloaded with LE edema and JVD.   In regards to family hx, reports mother having a stent later in life. No hx of sudden cardiac death, PPM or ICD placement. She denies any anginal symptoms, but has been limited in her physical activity 2/2 dyspnea over the past couple of months.    Past Medical History:  Diagnosis Date   Anxiety    Cataract    NS OD   Chest pain 43/15/4008   Complication of anesthesia    Per patient "needs a lot of anesthesia w/surgery" ,I had toruble with a spinal block not taking "   Diabetes mellitus without complication (HCC)    Type 2   Diabetic retinopathy (HCC)    BDR   Full dentures    GERD (gastroesophageal reflux disease)    Heart failure ([place])    History of surgery on arm    right arm broken   Hyperlipidemia    Hypertension    never diagnosed by physician per pt   Macular degeneration    Obesity     Past Surgical History:  Procedure Laterality Date   BIOPSY  09/30/2017   Procedure: BIOPSY;  Surgeon: Jerene Bears, MD;  Location: [facility];  Service: Gastroenterology;;   CERVICAL CONIZATION W/BX N/A 09/10/2017   Procedure: CONIZATION CERVIX WITH BIOPSY - COLD KNIFE;  Surgeon: Emily Filbert, MD;  Location: [facility];  Service: Gynecology;  Laterality: N/A;   [place]   x  1   CHOLECYSTECTOMY     gall stones   COLONOSCOPY WITH PROPOFOL N/A 09/30/2017   Procedure: COLONOSCOPY WITH PROPOFOL;  Surgeon: Jerene Bears, MD;  Location: [facility];  Service: Gastroenterology;  Laterality: N/A;   DILATION AND CURETTAGE OF UTERUS N/A 09/10/2017   Procedure: DILATATION AND CURETTAGE;  Surgeon: Emily Filbert, MD;  Location: [facility];  Service: Gynecology;  Laterality: N/A;   MULTIPLE TOOTH EXTRACTIONS     dentures upper and lowers   POLYPECTOMY  09/30/2017   Procedure: POLYPECTOMY;  Surgeon: Jerene Bears, MD;  Location: [facility];  Service: Gastroenterology;;    TONSILLECTOMY       Home Medications:  Prior to Admission medications   Medication Sig Start Date End Date Taking? Authorizing Provider  acetaminophen (TYLENOL) 500 MG tablet Take 1,500 mg by mouth every 6 (six) hours as needed for moderate pain or headache.     [provider]  aspirin 81 MG EC tablet Take 1 tablet (81 mg total) by mouth daily. Patient not taking: Reported on 02/20/2022 08/16/16   Shela Leff, MD  atorvastatin (LIPITOR) 40 MG tablet Take 1 tablet (40 mg total) by mouth daily. Patient not taking: Reported on 02/20/2022 09/06/21 09/06/22  Orvis Brill, MD  dapagliflozin propanediol (FARXIGA) 10 MG TABS tablet Take 1 tablet (10 mg total) by mouth daily before breakfast. Patient not taking: Reported on 02/20/2022 09/06/21   Orvis Brill, MD  dicyclomine (BENTYL) 20 MG tablet Take 1 tablet (20 mg total) by mouth 2 (two) times daily as needed for spasms (abdominal pain). 02/07/22   Velna Ochs, MD  ferrous sulfate 325 (65 FE) MG tablet Take 1 tablet (325 mg total) by mouth daily. 09/06/21 02/20/22  Orvis Brill, MD  Hypromellose (ARTIFICIAL TEARS OP) Place 1 drop into both eyes daily as needed (for dry eyes). Patient not taking: Reported on 02/20/2022    [provider]  ibuprofen (ADVIL,MOTRIN) 200 MG tablet Take 400 mg by mouth every 6 (six) hours as needed for headache or moderate pain.     [provider]  lactulose (CHRONULAC) 10 GM/15ML solution Take 15 mLs (10 g total) by mouth 2 (two) times daily as needed for mild constipation. 02/07/22   Velna Ochs, MD  lisinopril (ZESTRIL) 10 MG tablet Take 1 tablet (10 mg total) by mouth daily. 09/06/21   Orvis Brill, MD  Menthol, Topical Analgesic, (BIOFREEZE EX) Apply 1 application topically daily as needed (back pain). Patient not taking: Reported on 02/20/2022    [provider]  pantoprazole (PROTONIX) 40 MG tablet Take 1 tablet (40 mg total) by mouth daily.  02/07/22   Velna Ochs, MD  Semaglutide,0.25 or 0.'5MG'$ /DOS, 2 MG/3ML SOPN Inject 0.25 mg into the skin once a week. 01/22/22   Velna Ochs, MD  torsemide (DEMADEX) 20 MG tablet Take 2 tablets (40 mg total) by mouth daily. 02/15/22   Velna Ochs, MD    Inpatient Medications: Scheduled Meds:  Continuous Infusions:  PRN Meds:   Allergies:    Allergies  Allergen Reactions   Percocet [Oxycodone-Acetaminophen] Itching    Social History:   Social History   Socioeconomic History   Marital status: Divorced    Spouse name: Not on file   Number of children: Not on file   Years of education: Not on file   Highest education level: Not on file  Occupational History   Not on file  Tobacco Use   Smoking status: Former  Packs/day: 0.25    Years: 6.00    Total pack years: 1.50    Types: Cigarettes   Smokeless tobacco: Never   Tobacco comments:    quit 36 yrs ago  Vaping Use   Vaping Use: Never used  Substance and Sexual Activity   Alcohol use: Yes    Comment: occasionally   Drug use: No   Sexual activity: Not Currently    Birth control/protection: None  Other Topics Concern   Not on file  Social History Narrative   Not on file   Social Determinants of Health   Financial Resource Strain: High Risk (02/14/2022)   Overall Financial Resource Strain (CARDIA)    Difficulty of Paying Living Expenses: Hard  Food Insecurity: No Food Insecurity (02/21/2022)   Hunger Vital Sign    Worried About Running Out of Food in the Last Year: Never true    Ran Out of Food in the Last Year: Never true  Recent Concern: [place] Present (02/14/2022)   Hunger Vital Sign    Worried About Running Out of Food in the Last Year: Sometimes true    Ran Out of Food in the Last Year: Sometimes true  Transportation Needs: No Transportation Needs (02/21/2022)   PRAPARE - Hydrologist (Medical): No    Lack of Transportation  (Non-Medical): No  Physical Activity: Inactive (02/14/2022)   Exercise Vital Sign    Days of Exercise per Week: 0 days    Minutes of Exercise per Session: 0 min  Stress: No Stress Concern Present (02/14/2022)   [place]    Feeling of Stress : Not at all  Social Connections: Moderately Isolated (02/14/2022)   Social Connection and Isolation Panel [NHANES]    Frequency of Communication with Friends and Family: Twice a week    Frequency of Social Gatherings with Friends and Family: Once a week    Attends Religious Services: More than 4 times per year    Active Member of [employer] or Organizations: No    Attends Archivist Meetings: Never    Marital Status: Divorced  Human resources officer Violence: Not At Risk (02/21/2022)   Humiliation, Afraid, Rape, and Kick questionnaire    Fear of Current or Ex-Partner: No    Emotionally Abused: No    Physically Abused: No    Sexually Abused: No    Family History:    Family History  Problem Relation Age of Onset   Kidney disease Mother    Heart disease Mother    Diabetes Mother    Seizures Father    Heart disease Father    Other Father    Breast cancer Neg Hx      ROS:  Please see the history of present illness.   All other ROS reviewed and negative.     Physical Exam/Data:   Vitals:   02/21/22 0853  BP: (!) 142/78  Pulse: 87  Resp: 18  Temp: 97.9 F (36.6 C)  TempSrc: Oral  SpO2: 97%  Weight: (!) 156.7 kg  Height: '5\' 9"'$  (1.753 m)   No intake or output data in the 24 hours ending 02/21/22 1118    02/21/2022    8:53 AM 02/20/2022    1:41 PM 02/20/2022    9:43 AM  Last 3 Weights  Weight (lbs) 345 lb 7.4 oz 350 lb 351 lb 14.4 oz  Weight (kg) 156.7 kg 158.759 kg 159.621 kg  Body mass index is 51.02 kg/m.  General:  Morbidly obese female, sitting up on side of bed. No distress HEENT: normal Neck: + JVD Vascular: No carotid bruits; Distal pulses 2+  bilaterally Cardiac:  normal S1, S2; RRR; soft systolic murmur  Lungs: Diminished in bases Abd: protuberate, generalized tenderness to palpation Ext: 2-3+ pitting bilateral LE edema Musculoskeletal:  No deformities, BUE and BLE strength normal and equal Skin: warm and dry  Neuro:  CNs 2-12 intact, no focal abnormalities noted Psych:  Normal affect   EKG:  The EKG was personally reviewed and demonstrates: Sinus Rhythm, 93 bpm PAC, notched p wave Telemetry:  Telemetry was personally reviewed and demonstrates:  Sinus Rhythm  Relevant CV Studies:  Echo: 02/07/22  IMPRESSIONS     1. Left ventricular ejection fraction, by estimation, is 25 to 30%. The  left ventricle has severely decreased function. The left ventricle  demonstrates global hypokinesis. There is mild asymmetric left ventricular  hypertrophy of the inferior segment.  Left ventricular diastolic parameters are consistent with Grade III  diastolic dysfunction (restrictive). Elevated left ventricular  end-diastolic pressure.   2. Right ventricular systolic function is moderately reduced. The right  ventricular size is mildly enlarged. There is mildly elevated pulmonary  artery systolic pressure.   3. The mitral valve is normal in structure. No evidence of mitral valve  regurgitation. No evidence of mitral stenosis.   4. Tricuspid valve regurgitation is moderate to severe.   5. The aortic valve is normal in structure. Aortic valve regurgitation is  mild. Aortic valve sclerosis/calcification is present, without any  evidence of aortic stenosis.   6. There is mild dilatation of the ascending aorta, measuring 37 mm.   7. The inferior vena cava is normal in size with greater than 50%  respiratory variability, suggesting right atrial pressure of 3 mmHg.   FINDINGS   Left Ventricle: Left ventricular ejection fraction, by estimation, is 25  to 30%. The left ventricle has severely decreased function. The left  ventricle  demonstrates global hypokinesis. Definity contrast agent was  given IV to delineate the left  ventricular endocardial borders. The left ventricular internal cavity size  was normal in size. There is mild asymmetric left ventricular hypertrophy  of the inferior segment. Left ventricular diastolic parameters are  consistent with Grade III diastolic  dysfunction (restrictive). Elevated left ventricular end-diastolic  pressure.   Right Ventricle: The right ventricular size is mildly enlarged. No  increase in right ventricular wall thickness. Right ventricular systolic  function is moderately reduced. There is mildly elevated pulmonary artery  systolic pressure. The tricuspid  regurgitant velocity is 2.71 m/s, and with an assumed right atrial  pressure of 8 mmHg, the estimated right ventricular systolic pressure is  05.3 mmHg.   Left Atrium: Left atrial size was normal in size.   Right Atrium: Right atrial size was normal in size.   Pericardium: There is no evidence of pericardial effusion.   Mitral Valve: The mitral valve is normal in structure. No evidence of  mitral valve regurgitation. No evidence of mitral valve stenosis. MV peak  gradient, 4.2 mmHg. The mean mitral valve gradient is 2.0 mmHg.   Tricuspid Valve: The tricuspid valve is normal in structure. Tricuspid  valve regurgitation is moderate to severe. No evidence of tricuspid  stenosis.   Aortic Valve: The aortic valve is normal in structure. Aortic valve  regurgitation is mild. Aortic regurgitation PHT measures 314 msec. Aortic  valve sclerosis/calcification is present, without any evidence of  aortic  stenosis.   Pulmonic Valve: The pulmonic valve was normal in structure. Pulmonic valve  regurgitation is not visualized. No evidence of pulmonic stenosis.   Aorta: There is mild dilatation of the ascending aorta, measuring 37 mm.   Venous: The inferior vena cava is normal in size with greater than 50%  respiratory  variability, suggesting right atrial pressure of 3 mmHg.   IAS/Shunts: No atrial level shunt detected by color flow Doppler.    Laboratory Data:  High Sensitivity Troponin:   Recent Labs  Lab 01/28/22 0741 01/28/22 0944  TROPONINIHS 17 17     Chemistry Recent Labs  Lab 02/20/22 1008  NA 141  K 4.3  CL 102  CO2 29  GLUCOSE 131*  BUN 28*  CREATININE 1.57*  CALCIUM 8.9  GFRNONAA 37*  ANIONGAP 10    Recent Labs  Lab 02/20/22 1008  PROT 7.5  ALBUMIN 3.0*  AST 24  ALT 17  ALKPHOS 105  BILITOT 1.2   Lipids No results for input(s): "CHOL", "TRIG", "HDL", "LABVLDL", "LDLCALC", "CHOLHDL" in the last 168 hours.  HematologyNo results for input(s): "WBC", "RBC", "HGB", "HCT", "MCV", "MCH", "MCHC", "RDW", "PLT" in the last 168 hours. Thyroid No results for input(s): "TSH", "FREET4" in the last 168 hours.  BNP Recent Labs  Lab 02/20/22 1008  BNP 1,423.2*    DDimer No results for input(s): "DDIMER" in the last 168 hours.   Radiology/Studies:  No results found.   Assessment and Plan:   [name] is a 66 y.o. female with a hx of GERD, cirrhosis, HTN, DM, obesity, HLD who is being seen 02/21/2022 for the evaluation of CHF at the request of Dr. Philipp Ovens.  BiV Heart failure GIII DD -- presents for direct admission with progressive dyspnea, LE edema and orthopnea for several months -- outpt echo showed LVEF of 25-30%, global hypokinesis, GIIIDD, moderately reduced RV with mild enlargement. BNP 1423. Has been responding decently to torsemide as an outpatient but remains significantly volume overloaded  -- start IV lasix '80mg'$  BID -- will need R/LHC, plan for 11/24. Further work up pending cath with possible cMRI -- GDMT: continue farxiga, pending renal function add spiro. Has been on lisinopril as an outpatient, hold with plans to transition to [facility].   Shared Decision Making/Informed Consent{ The risks [stroke (1 in 1000), death (1 in 1000), kidney failure  [usually temporary] (1 in 500), bleeding (1 in 200), allergic reaction [possibly serious] (1 in 200)], benefits (diagnostic support and management of coronary artery disease) and alternatives of a cardiac catheterization were discussed in detail with Ms. Collar and she is willing to proceed.  HTN -- blood pressures stable -- as above, holding lisinopril with plans for Entresto likely post cath  HLD -- continue atorvastatin   DM -- Hgb A1c 7.8 (10/23) -- continue farxiga -- SSI while inpatient  IDA -- Iron 27, Iron sat 7, Hgb 10.7 (11/8) -- follow up CBC -- would benefit from IV iron  Abd pain Cirrhosis -- reports ongoing abd pain for the past couple of weeks. LFTs were normal 11/21. Has been on bentyl, protonix -- question whether CHF contributing? CT abd/pelvis with suggestion of cirrhosis -- work up per primary team  Obesity -- diet/lifestyle modifications  Risk Assessment/Risk Scores:   New York Heart Association (NYHA) Functional Class NYHA Class III   For questions or updates, please contact [place] Please consult [email or web address] for contact info under    Signed, Reino Bellis, NP  02/21/2022  11:18 AM  I have seen and examined the patient along with Reino Bellis, NP .  I have reviewed the chart, notes and new data.  I agree with PA/NP's note.  Key new complaints: Improving symptoms of biventricular heart failure with diuresis.  The orthopneic and markedly edematous Key examination changes: Superobesity limits the physical exam but she does have evidence of jugular venous distention and substantial lower extremity edema.  Tricuspid regurgitation murmur is relatively mild, 1-2/6, less than expected from echo description of severe tricuspid regurgitation; no diastolic murmurs heard. Key new findings / data: ECG shows normal sinus rhythm, she does not have low voltage when we take into account her degree of obesity.  There is evidence of left atrial  abnormality.  There are no ischemic changes. 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  PLAN: Recommend right and left heart catheterization after she has had some additional diuresis.  Tentatively scheduled for Friday. The procedure was discussed in detail and she provided consent. After we have achieved better diuresis and she is close to "dry weight" we will start Ferne Coe or Farxiga and carvedilol.  Would leave spironolactone for later, after we see response of her renal function to diuretics and Entresto. Recheck LVEF in 90 days.  Consider ICD if EF remains less than 35%.  He does have restrictive mitral inflow filling and a severely that her left atrium, but otherwise the presentation is not typical for amyloidosis; there is only mild left ventricular hypertrophy and there is no significant loss of voltage on the ECG.  No evidence of monoclonal spike on her immunoelectrophoresis.  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  Sanda Klein, MD, [place] [phone] 02/21/2022, 1:23 PM

## 2022-02-21 NOTE — Progress Notes (Signed)
[place] for Lovenox - obesity dosing Indication: VTE prophylaxis  Allergies  Allergen Reactions   Percocet [Oxycodone-Acetaminophen] Itching    Patient Measurements: Height: '5\' 9"'$  (175.3 cm) Weight: (!) 156.7 kg (345 lb 7.4 oz) IBW/kg (Calculated) : 66.2  Vital Signs: Temp: 97.9 F (36.6 C) (11/22 0853) Temp Source: Oral (11/22 0853) BP: 142/78 (11/22 0853) Pulse Rate: 87 (11/22 0853)  Labs: Recent Labs    02/20/22 1008 02/21/22 1426  HGB  --  11.2*  HCT  --  38.4  PLT  --  211  CREATININE 1.57* 1.52*    Estimated Creatinine Clearance: 60.4 mL/min (A) (by C-G formula based on SCr of 1.52 mg/dL (H)).   Medical History: Past Medical History:  Diagnosis Date   Anxiety    Cataract    NS OD   Chest pain 73/53/2992   Complication of anesthesia    Per patient "needs a lot of anesthesia w/surgery" ,I had toruble with a spinal block not taking "   Diabetes mellitus without complication (HCC)    Type 2   Diabetic retinopathy (HCC)    BDR   Full dentures    GERD (gastroesophageal reflux disease)    Heart failure ([place])    History of surgery on arm    right arm broken   Hyperlipidemia    Hypertension    never diagnosed by physician per pt   Macular degeneration    Obesity      Assessment: 66 yo female admitted for CHF, pharmacy asked to add Lovenox for VTE prophylaxis.  BMI 51.    Goal of Therapy:   Monitor platelets by anticoagulation protocol: Yes   Plan:  Lovenox 80 mg q 24 hrs (= 0.5 mg/kg). Will watch weight, may need to reduce dosing with diuresis.  [place] Crane, Roylene Reason, BCCP Clinical Pharmacist  02/21/2022 3:24 PM   [facility] pharmacy phone numbers are listed on [email or web address]

## 2022-02-21 NOTE — Consult Note (Signed)
   [facility] Inpatient Consult   02/21/2022  Tina Lara [birth year] [id number]  [place] Organization [ACO] Patient: [name]  Primary Care Provider:  Velna Ochs, MD, [facility] Internal Medicine is listed to provide the transition of care follow up   Patient screened for hospitalization with noted a recent  [place] Management service out reach for needs for post hospital transition for care coordination.  Review of patient's electronic medical record reveals patient was a direct admission.  *Met with patient and family regarding post hospital follow up needs.  She endorses her PCP provider office.    Plan:  Continue to follow progress and disposition to assess for post hospital community care coordination/management needs.  Referral request for community care coordination: already acitve with [facility] RNfor care coordination out reaches prior to admission.  Will follow up with [facility] RNCM and provide any known updates for post hospital follow up.  Of note, [facility] Care Management/Population Health does not replace or interfere with any arrangements made by the Inpatient Transition of Care team.  For questions contact:   Natividad Brood, RN BSN [place]  [phone] business mobile phone Toll free office [phone]  *[place]  [phone] Fax number: [phone] [email or web address]

## 2022-02-21 NOTE — H&P (Cosign Needed)
Date: 02/21/2022               Patient Name:  Tina Lara MRN: [id number]  DOB: [birth year] Age / Sex: 66 y.o., female   PCP: Velna Ochs, MD         Medical Service: Internal Medicine Teaching Service         Attending Physician: Dr. Evette Doffing, Mallie Mussel, *    First Contact: Dr. Leigh Aurora  Pager: [phone]  Second Contact: Dr. Idamae Schuller Pager: [phone]       After Hours (After 5p/  First Contact Pager: [phone]  weekends / holidays): Second Contact Pager: [phone]   Chief Complaint: Newly diagnosed HFrEF  History of Present Illness:  [name] is a 66 yo F with a PMH of HFrEF (EF 25 to 30%), GERD, ?cirrhosis, HTN, DM, class 3 class 3 obesity, and HLD who presents for further management of her newly diagnosed HFrEF.    She presents after being seen by her cardiologist in the outpatient setting and noted to be significantly hypervolemic. She was advised to be directly admitted for CHF management and consideration of cMRI/PYP scan.   Patient was seen in [facility] ED 10/29 for SOB and orthopnea. CTA of the chest was negative for PE but noted possible cirrhosis. She was given IV lasix and discharged from the ED. She presented to the [facility] 11/8 with abdominal pain, dyspnea, and BLE edema. A TTE was obtained which showed newly reduced LVEF of 25 to 30%, global hypokinesis, grade 3 diastolic dysfunction, moderately reduced RV systolic function with mild enlargement, mildly elevated PASP, and moderate to severe TR. SPEP, FLC and ratio were obtained as well as IF, which showed Kappa free light chain 66.9, Lambda free light chain 27.3, kappa/lambda ratio 2.45, IgG 2494, no M-spike. Confirming no evidence of amyloid or monoclonal gammopathy. She was placed on torsemide '40mg'$  daily. Offered for inpatient admission with IV diuresis but the patient declined.   At her follow up appointment the day prior to admission patient reported ~15lb weight loss, but she was noted to be  significantly volume overloaded.  She was subsequently directly admitted from clinic.   On admission, she states she has no respiratory symptoms, noting no dyspnea, chest pain, orthopnea, or PND. She sates that she is able to walk around a grocery store without dyspnea and she can get dressed and bathe without dyspnea.   Patient's primary concern is her abdominal pain.  She states that her pain began about 2 months ago postprandially.  She states she felt a nonradiating, burning spasm pain in her epigastrium subsided without any intervention.  Since that time she has had similar symptoms after eating, most notably after eating greasy foods.  The patient has tried eating ice to "cool her stomach ".  Her PCP has also given her Bentyl and Protonix which she notes also helps with her symptoms.  Patient also reports that she has some associated nausea but this does not always occur and she has never had any episodes of emesis. Since her abdominal discomfort began she has also had dysphagia, noting that solid foods sometimes become stuck at the bottom of her chest. She does not feel liquids get stuck and notes that the solid foods eventually pass. She does not report any worsening of this sensation over the last two months. She has not had any significant weight loss and continues to eat.   She denies diarrhea, fever or chills. She usually  has constipation and currently takes lactulose to help with this.   Of note, patient does report chronic NSAID use over the last several months for pain. She takes about four ibuprofen a day, four days out of the week.    Finally, due to concern for monoclonal gammopathy/amyloid spoke with Dr. Alvy Bimler (Hematology) who noted that the patient has a polyclonal process which argues against MGUS or amyloid. She noted that her elevated kappa light chain is likely in the setting of her CKD.   Meds:  Current Meds  Medication Sig   acetaminophen (TYLENOL) 500 MG tablet Take  1,500 mg by mouth every 6 (six) hours as needed for moderate pain or headache.    atorvastatin (LIPITOR) 40 MG tablet Take 1 tablet (40 mg total) by mouth daily.   dicyclomine (BENTYL) 20 MG tablet Take 1 tablet (20 mg total) by mouth 2 (two) times daily as needed for spasms (abdominal pain).   ferrous sulfate 325 (65 FE) MG tablet Take 1 tablet (325 mg total) by mouth daily.   Hypromellose (ARTIFICIAL TEARS OP) Place 1 drop into both eyes daily as needed (for dry eyes).   lactulose (CHRONULAC) 10 GM/15ML solution Take 15 mLs (10 g total) by mouth 2 (two) times daily as needed for mild constipation.   lisinopril (ZESTRIL) 10 MG tablet Take 1 tablet (10 mg total) by mouth daily.   Menthol, Topical Analgesic, (BIOFREEZE EX) Apply 1 application  topically daily as needed (back pain).   pantoprazole (PROTONIX) 40 MG tablet Take 1 tablet (40 mg total) by mouth daily.   Semaglutide,0.25 or 0.'5MG'$ /DOS, 2 MG/3ML SOPN Inject 0.25 mg into the skin once a week.   torsemide (DEMADEX) 20 MG tablet Take 2 tablets (40 mg total) by mouth daily.   Patient does not take Lipitor, aspirin, or lisinopril   Allergies: Allergies as of 02/20/2022 - Review Complete 02/20/2022  Allergen Reaction Noted   Percocet [oxycodone-acetaminophen] Itching 09/13/2017   Past Medical History:  Diagnosis Date   Anxiety    Cataract    NS OD   Chest pain 54/00/8676   Complication of anesthesia    Per patient "needs a lot of anesthesia w/surgery" ,I had toruble with a spinal block not taking "   Diabetes mellitus without complication (HCC)    Type 2   Diabetic retinopathy (HCC)    BDR   Full dentures    GERD (gastroesophageal reflux disease)    Heart failure ([place])    History of surgery on arm    right arm broken   Hyperlipidemia    Hypertension    never diagnosed by physician per pt   Macular degeneration    Obesity     Family History:  Family History  Problem Relation Age of Onset   Kidney disease Mother     Heart disease Mother    Diabetes Mother    Seizures Father    Heart disease Father    Other Father    Breast cancer Neg Hx    Mom: ESRD, CAD  Dad: HTN   Social History:  Smoked for about a year as a teen.  Drinks one or two mixed drinks occasionally Lives alone but has family that checks on her regularly and her very involved in her life  Review of Systems: A complete ROS was negative except as per HPI.   Physical Exam: Blood pressure (!) 142/78, pulse 87, temperature 97.9 F (36.6 C), temperature source Oral, resp. rate 18, height '5\' 9"'$  (  1.753 m), weight (!) 156.7 kg, SpO2 97 %.  Constitutional: Well-developed, well-nourished, and in no distress.  Cardiovascular: Normal rate, regular rhythm, intact distal pulses. No gallop and no friction rub.  No murmur heard. 2+ edema of BLE, JVD to mid way up the neck  Pulmonary: Non labored breathing on room air, no wheezing or rales  Abdominal: Soft. Normal bowel sounds. Non distended and non tender Musculoskeletal: Normal range of motion.        General: No tenderness Neurological: Alert and oriented to person, place, and time. Non focal  Skin: Skin is warm and dry.    EKG: pending as of this note    TTE 02/07/2022 Left ventricular ejection fraction, by estimation, is 25 to 30%. The left ventricle has severely decreased function. The left ventricle demonstrates global hypokinesis. There is mild asymmetric left ventricular hypertrophy of the inferior segment. Left ventricular diastolic parameters are consistent with Grade III diastolic dysfunction (restrictive). Elevated left ventricular end-diastolic pressure. 1. Right ventricular systolic function is moderately reduced. The right ventricular size is mildly enlarged. There is mildly elevated pulmonary artery systolic pressure. 2. The mitral valve is normal in structure. No evidence of mitral valve regurgitation. No evidence of mitral stenosis. 3. 4. Tricuspid valve regurgitation  is moderate to severe. The aortic valve is normal in structure. Aortic valve regurgitation is mild. Aortic valve sclerosis/calcification is present, without any evidence of aortic stenosis. 5. 6. There is mild dilatation of the ascending aorta, measuring 37 mm. The inferior vena cava is normal in size with greater than 50% respiratory variability, suggesting right atrial pressure of 3 mmHg.   Assessment & Plan by Problem: Active Problems:   HFrEF (heart failure with reduced ejection fraction) ([place]) Ms. Vermont L. Wentworth is a 66 yo F with a PMH of HFrEF (EF 25 to 30%), GERD, ?cirrhosis, HTN, DM, class 3 class 3 obesity, and HLD who presents for further management of her newly diagnosed HFrEF with signs of hypervolemia on exam.   #HFrEF Patient recently noted to have EF of 25 to 30% on TTE 11/8. She continued to be fluid overloaded despite diuresis in the outpatient setting. BNP elevated to 1400. Cardiology is following and the plan is for IV diuresis as well as LHC/RHC 11/24.   -IV lasix '80mg'$  BID  -Strict I/Os -Daily weights   #Undifferentiated abdominal pain Patient notes 2 months of post prandial pain, primarily in the epigastrium that feels like a burning sensation and improves with protonix use. She also notes possible dysphagia. These is in the setting of frequent and chronic NSAID use. Her symptoms are most consistent reflux. She has had no anorexia, weight loss, emesis, or bleeding with bowel movements. However because of her dysphagia, the onset of her symptoms, and that she has a persistent IDA a GI consult may be warranted while inpatient after she is stable from a cardiac perspective.  -Continue to discuss with patient the importance of discontinuing ibuprofen   #? Cirrhosis Possible cirrhosis noted on recent ct scan of the abdomen and pelvis. Patient noted to have mildly elevated INR 1.3, alb of 3. It was felt that her imaging findings were 2/2 R sided heart failure.  -Plan for  [place] elastography of the liver in the outpatient setting.   #?AKI Unclear of her baseline, but 1.08 is the lowest her sCr has been. Likely cardiorenal in nature.  -IV diuresis per above.  -Daily BMPs  #T2DM Last A1c 7.8 1 mo ago.  -SSI and transition to  LA+SSI tomorrow   #IDA Has had colonoscopy in 09/2017 which was notable for polyps Two 4 to 5 mm polyps in the transverse colon, one 5 mm polyp in the descending colon, inflamed sigmoid diverticulum in the sigmoid colon and mild diverticulosis in the sigmoid colon, in the descending colon and in the ascending colon. Polyps were noted to be tubular adenomas with no high grade dysplasia or malignancy and the inflamed sigmoid diverticulum showed no adenomatous change or malignancy. Her hgb is stable from 2 weeks ago. Iron panel from 2 weeks ago showed TSAT of 7% and ferritin of 74. Patient was referred to GI regarding this. She denies melena, hematochezia or hematemesis.  -IV iron for repletion while inpatient her iron deficit ~'800mg'$    #Mild leukopenia  Unclear etiology. Appears to be stable over the past month. Last differential obtained showed decreased ALC (0.5) but otherwise normal diff.  -Repeat cbc with diff in the AM -Continue to monitor   Dispo: Admit patient to Inpatient with expected length of stay greater than 2 midnights.  Signed: Rick Duff, MD 02/21/2022, 4:03 PM  After 5pm on weekdays and 1pm on weekends: On Call pager: [phone]

## 2022-02-21 NOTE — Hospital Course (Addendum)
Tina Lara is a 66 y.o. female with past medical history of HFrEF (EF 25 to 30%), GERD, questionable cirrhosis, hypertension, type 2 diabetes mellitus, hyperlipidemia who presented with concerns of shortness of breath.  Patient found in acute exacerbation of HFrEF.  Patient was admitted for further treatment and management.   #High-output cardiac failure Patient was found to have high-output cardiac failure during hospitalization.  Etiology is currently obesity versus cirrhosis versus anemia.  Patient initially presented to the emergency room with concerns of shortness of breath.  Patient was found to be in acute heart failure exacerbation.  Patient's most recent echocardiogram showed ejection fraction of 25 to 30%.  Patient had a heart cath during hospitalization, which showed high-output cardiac failure with no ischemic disease.  Patient was started on GDMT including Entresto, Farxiga, Coreg, and viral lactone.  Patient is to follow-up with cardiology team.  Patient became euvolemic during hospitalization with more than 20 L being pulled off.  Patient to be discharged on GDMT with close follow-up with heart failure clinic as well as PCP.     #WHO class II pulmonary artery hypertension Cardiac catheterization during hospitalization revealed patient had WHO class II pulmonary artery hypertension.  Likely secondary to left heart failure.  Patient discharged on GDMT.  Follow-up echo outpatient.   #CKD IIIb  Patient creatinine remained at baseline during hospitalization.  Ensure patient's creatinine remains baseline around 1.2-1.4.    #Hypertension Patient's blood pressures during hospitalization remained between 110s and 130s.  Patient remains on Coreg, spironolactone, and Entresto.  Continue to monitor blood pressure outpatient.    #Hypomagnesemia, resolved #Hypokalemia, resolved  Magnesium and potassium during hospitalization repleted as needed.  #GERD Patient continue to be on Protonix  daily and hospitalization.   #Type 2 diabetes mellitus Patient's sugars during hospitalization remained within normal limits.  Patient did start Farxiga 10 mg daily during hospitalization.  Patient had minimal use of sliding scale insulin.  Patient discharged with 10 mg Farxiga.    #Possible cirrhosis Patient to have outpatient elastography of liver outpatient.  Possible cirrhosis could be etiology of her high-output cardiac failure.  Continue to follow-up outpatient.   #Iron deficiency anemia Patient has an iron deficit of around 800 mg on admission.  Patient got IV Ferrlecit during hospitalization and was transition to oral iron every other day.  Continue oral ferrous sulfate 325 mg every other day.    #Mild leukopenia Patient's white blood cell count at 3.5.  Unclear etiology.  Continue to workup outpatient.   #Hyperlipidemia Patient continued on atorvastatin 40 mg daily and aspirin 81 mg daily during hospitalization.

## 2022-02-22 DIAGNOSIS — I110 Hypertensive heart disease with heart failure: Secondary | ICD-10-CM | POA: Diagnosis not present

## 2022-02-22 DIAGNOSIS — I5021 Acute systolic (congestive) heart failure: Secondary | ICD-10-CM | POA: Diagnosis not present

## 2022-02-22 DIAGNOSIS — E7849 Other hyperlipidemia: Secondary | ICD-10-CM

## 2022-02-22 DIAGNOSIS — Z87891 Personal history of nicotine dependence: Secondary | ICD-10-CM

## 2022-02-22 DIAGNOSIS — I5041 Acute combined systolic (congestive) and diastolic (congestive) heart failure: Secondary | ICD-10-CM

## 2022-02-22 DIAGNOSIS — I10 Essential (primary) hypertension: Secondary | ICD-10-CM

## 2022-02-22 LAB — IRON AND TIBC
Iron: 122 ug/dL (ref 28–170)
Saturation Ratios: 27 % (ref 10.4–31.8)
TIBC: 449 ug/dL (ref 250–450)
UIBC: 327 ug/dL

## 2022-02-22 LAB — COMPREHENSIVE METABOLIC PANEL
ALT: 18 U/L (ref 0–44)
AST: 21 U/L (ref 15–41)
Albumin: 3 g/dL — ABNORMAL LOW (ref 3.5–5.0)
Alkaline Phosphatase: 90 U/L (ref 38–126)
Anion gap: 10 (ref 5–15)
BUN: 24 mg/dL — ABNORMAL HIGH (ref 8–23)
CO2: 29 mmol/L (ref 22–32)
Calcium: 8.9 mg/dL (ref 8.9–10.3)
Chloride: 100 mmol/L (ref 98–111)
Creatinine, Ser: 1.53 mg/dL — ABNORMAL HIGH (ref 0.44–1.00)
GFR, Estimated: 38 mL/min — ABNORMAL LOW (ref >60)
Glucose, Bld: 124 mg/dL — ABNORMAL HIGH (ref 70–99)
Potassium: 3.5 mmol/L (ref 3.5–5.1)
Sodium: 139 mmol/L (ref 135–145)
Total Bilirubin: 1.1 mg/dL (ref 0.3–1.2)
Total Protein: 7.4 g/dL (ref 6.5–8.1)

## 2022-02-22 LAB — CBC WITH DIFFERENTIAL/PLATELET
Abs Immature Granulocytes: 0.01 10*3/uL (ref 0.00–0.07)
Abs Immature Granulocytes: 0.01 10*3/uL (ref 0.00–0.07)
Basophils Absolute: 0 10*3/uL (ref 0.0–0.1)
Basophils Absolute: 0 10*3/uL (ref 0.0–0.1)
Basophils Relative: 0 %
Basophils Relative: 1 %
Eosinophils Absolute: 0.1 10*3/uL (ref 0.0–0.5)
Eosinophils Absolute: 0.1 10*3/uL (ref 0.0–0.5)
Eosinophils Relative: 3 %
Eosinophils Relative: 4 %
HCT: 37.8 % (ref 36.0–46.0)
HCT: 40.3 % (ref 36.0–46.0)
Hemoglobin: 10.8 g/dL — ABNORMAL LOW (ref 12.0–15.0)
Hemoglobin: 11.5 g/dL — ABNORMAL LOW (ref 12.0–15.0)
Immature Granulocytes: 0 %
Immature Granulocytes: 0 %
Lymphocytes Relative: 15 %
Lymphocytes Relative: 17 %
Lymphs Abs: 0.5 10*3/uL — ABNORMAL LOW (ref 0.7–4.0)
Lymphs Abs: 0.5 10*3/uL — ABNORMAL LOW (ref 0.7–4.0)
MCH: 23.8 pg — ABNORMAL LOW (ref 26.0–34.0)
MCH: 23.8 pg — ABNORMAL LOW (ref 26.0–34.0)
MCHC: 28.5 g/dL — ABNORMAL LOW (ref 30.0–36.0)
MCHC: 28.6 g/dL — ABNORMAL LOW (ref 30.0–36.0)
MCV: 83.3 fL (ref 80.0–100.0)
MCV: 83.3 fL (ref 80.0–100.0)
Monocytes Absolute: 0.6 10*3/uL (ref 0.1–1.0)
Monocytes Absolute: 0.6 10*3/uL (ref 0.1–1.0)
Monocytes Relative: 20 %
Monocytes Relative: 22 %
Neutro Abs: 1.6 10*3/uL — ABNORMAL LOW (ref 1.7–7.7)
Neutro Abs: 1.8 10*3/uL (ref 1.7–7.7)
Neutrophils Relative %: 58 %
Neutrophils Relative %: 60 %
Platelets: 193 10*3/uL (ref 150–400)
Platelets: 209 10*3/uL (ref 150–400)
RBC: 4.54 MIL/uL (ref 3.87–5.11)
RBC: 4.84 MIL/uL (ref 3.87–5.11)
RDW: 16.9 % — ABNORMAL HIGH (ref 11.5–15.5)
RDW: 17 % — ABNORMAL HIGH (ref 11.5–15.5)
WBC: 2.8 10*3/uL — ABNORMAL LOW (ref 4.0–10.5)
WBC: 3 10*3/uL — ABNORMAL LOW (ref 4.0–10.5)
nRBC: 0 % (ref 0.0–0.2)
nRBC: 0 % (ref 0.0–0.2)

## 2022-02-22 LAB — PROTIME-INR
INR: 1.4 — ABNORMAL HIGH (ref 0.8–1.2)
Prothrombin Time: 17 seconds — ABNORMAL HIGH (ref 11.4–15.2)

## 2022-02-22 LAB — GLUCOSE, CAPILLARY
Glucose-Capillary: 134 mg/dL — ABNORMAL HIGH (ref 70–99)
Glucose-Capillary: 142 mg/dL — ABNORMAL HIGH (ref 70–99)
Glucose-Capillary: 117 mg/dL — ABNORMAL HIGH (ref 70–99)
Glucose-Capillary: 138 mg/dL — ABNORMAL HIGH (ref 70–99)

## 2022-02-22 LAB — RETICULOCYTES
Immature Retic Fract: 13.8 % (ref 2.3–15.9)
RBC.: 4.46 MIL/uL (ref 3.87–5.11)
Retic Count, Absolute: 59.3 10*3/uL (ref 19.0–186.0)
Retic Ct Pct: 1.3 % (ref 0.4–3.1)

## 2022-02-22 LAB — MAGNESIUM: Magnesium: 1.8 mg/dL (ref 1.7–2.4)

## 2022-02-22 LAB — FERRITIN: Ferritin: 40 ng/mL (ref 11–307)

## 2022-02-22 MED ORDER — MAGNESIUM SULFATE 2 GM/50ML IV SOLN
2.0000 g | Freq: Once | INTRAVENOUS | Status: AC
  Administered 2022-02-22: 2 g via INTRAVENOUS
  Filled 2022-02-22: qty 50

## 2022-02-22 MED ORDER — SODIUM CHLORIDE 0.9 % IV SOLN
125.0000 mg | Freq: Every day | INTRAVENOUS | Status: DC
  Administered 2022-02-22: 125 mg via INTRAVENOUS
  Filled 2022-02-22 (×2): qty 10

## 2022-02-22 MED ORDER — DAPAGLIFLOZIN PROPANEDIOL 10 MG PO TABS
10.0000 mg | ORAL_TABLET | Freq: Every day | ORAL | Status: DC
  Administered 2022-02-22 – 2022-02-27 (×6): 10 mg via ORAL
  Filled 2022-02-22 (×6): qty 1

## 2022-02-22 MED ORDER — ACETAMINOPHEN 325 MG PO TABS
650.0000 mg | ORAL_TABLET | Freq: Four times a day (QID) | ORAL | Status: DC | PRN
  Administered 2022-02-23 – 2022-02-26 (×3): 650 mg via ORAL
  Filled 2022-02-22 (×3): qty 2

## 2022-02-22 NOTE — Evaluation (Signed)
Physical Therapy Evaluation Patient Details Name: Tina Lara MRN: [id number] DOB: Jul 23, 1955 Today's Date: 02/22/2022  History of Present Illness  Pt is a 66 y.o. F who presents 02/21/2022 with hypervolemia and admitted for CHF management. Significant PMH: HFrEF (EF 25-30%), GERD, ? Cirrhosis, HTN, DM class 3 obesity.  Clinical Impression  PTA, pt lives alone in a ranch style home and is a household ambulator using a cane. Pt presents with decreased cardiopulmonary endurance, weakness and impaired balance. Pt ambulating 60 ft with a Rollator at a min guard assist level. HR peak 115 bpm. Encouraged continued daily mobilization while inpatient. Pt will benefit from HHPT at d/c to address deficits and maximize functional mobility.     Recommendations for follow up therapy are one component of a multi-disciplinary discharge planning process, led by the attending physician.  Recommendations may be updated based on patient status, additional functional criteria and insurance authorization.  Follow Up Recommendations Home health PT      Assistance Recommended at Discharge PRN  Patient can return home with the following  A little help with bathing/dressing/bathroom;Assistance with cooking/housework;Assist for transportation;Help with stairs or ramp for entrance    Equipment Recommendations Rollator (4 wheels) (bariatric)  Recommendations for Other Services       Functional Status Assessment Patient has had a recent decline in their functional status and demonstrates the ability to make significant improvements in function in a reasonable and predictable amount of time.     Precautions / Restrictions Precautions Precautions: Fall Restrictions Weight Bearing Restrictions: No      Mobility  Bed Mobility               General bed mobility comments: Sitting EOB upon arrival    Transfers Overall transfer level: Needs assistance Equipment used: Rollator (4  wheels) Transfers: Sit to/from Stand Sit to Stand: Supervision           General transfer comment: Supervision for safety    Ambulation/Gait Ambulation/Gait assistance: Min guard Gait Distance (Feet): 60 Feet Assistive device: Rollator (4 wheels) Gait Pattern/deviations: Decreased stride length, Step-through pattern, Trunk flexed, Wide base of support Gait velocity: decreased Gait velocity interpretation: <1.8 ft/sec, indicate of risk for recurrent falls   General Gait Details: Wider BOS, increased lateral sway, min guard for safety. Fatigues easily  Financial trader Rankin (Stroke Patients Only)       Balance Overall balance assessment: Needs assistance Sitting-balance support: Feet supported Sitting balance-Leahy Scale: Good     Standing balance support: Bilateral upper extremity supported Standing balance-Leahy Scale: Poor Standing balance comment: reliant on external support                             Pertinent Vitals/Pain Pain Assessment Pain Assessment: No/denies pain    Home Living Family/patient expects to be discharged to:: Private residence Living Arrangements: Alone Available Help at Discharge: Family;Available PRN/intermittently Type of Home: House Home Access: Ramped entrance       Home Layout: One level Home Equipment: Cane - single point;Toilet riser      Prior Function Prior Level of Function : Independent/Modified Independent             Mobility Comments: mainly household ambulator with cane ADLs Comments: uses [employer] delivery     Hand Dominance        Extremity/Trunk Assessment   Upper Extremity  Assessment Upper Extremity Assessment: Generalized weakness    Lower Extremity Assessment Lower Extremity Assessment: Generalized weakness    Cervical / Trunk Assessment Cervical / Trunk Assessment: Other exceptions Cervical / Trunk Exceptions: increased body habitus   Communication   Communication: No difficulties  Cognition Arousal/Alertness: Awake/alert Behavior During Therapy: WFL for tasks assessed/performed Overall Cognitive Status: Within Functional Limits for tasks assessed                                          General Comments      Exercises     Assessment/Plan    PT Assessment Patient needs continued PT services  PT Problem List Decreased strength;Decreased activity tolerance;Decreased balance;Decreased mobility;Cardiopulmonary status limiting activity       PT Treatment Interventions DME instruction;Stair training;Gait training;Therapeutic activities;Functional mobility training;Therapeutic exercise;Balance training;Patient/family education    PT Goals (Current goals can be found in the Care Plan section)  Acute Rehab PT Goals Patient Stated Goal: to walk longer distances PT Goal Formulation: With patient Time For Goal Achievement: 03/08/22 Potential to Achieve Goals: Good    Frequency Min 3X/week     Co-evaluation               AM-PAC PT "6 Clicks" Mobility  Outcome Measure Help needed turning from your back to your side while in a flat bed without using bedrails?: None Help needed moving from lying on your back to sitting on the side of a flat bed without using bedrails?: None Help needed moving to and from a bed to a chair (including a wheelchair)?: A Little Help needed standing up from a chair using your arms (e.g., wheelchair or bedside chair)?: A Little Help needed to walk in hospital room?: A Little Help needed climbing 3-5 steps with a railing? : A Lot 6 Click Score: 19    End of Session   Activity Tolerance: Patient tolerated treatment well Patient left: in bed;with call bell/phone within reach Nurse Communication: Mobility status PT Visit Diagnosis: Unsteadiness on feet (R26.81);Difficulty in walking, not elsewhere classified (R26.2)    Time: 0488-8916 PT Time Calculation (min)  (ACUTE ONLY): 18 min   Charges:   PT Evaluation $PT Eval Moderate Complexity: 1 Mod          Wyona Almas, PT, DPT Acute Rehabilitation Services Office [phone]   Deno Etienne 02/22/2022, 10:14 AM

## 2022-02-22 NOTE — Progress Notes (Signed)
HD#1 Subjective:   Summary: This is a 66 year old female with past medical history of HFrEF (EF 25 to 30%), GERD, questionable cirrhosis, hypertension, type 2 diabetes mellitus, hyperlipidemia who presented with concerns of shortness of breath.  Patient found in acute exacerbation of HFrEF.  Patient was admitted for further treatment and management.  Overnight Events: No acute overnight events  Patient is sitting up in bed upon my exam.  She denies any shortness of breath at this time.  She states that she is feeling better.  She denies any dizziness, lightheadedness, or any other concerns at this time.  She reports that the Protonix helped with her abdominal pain, and has no issues with her abdominal pain this morning.  Objective:  Vital signs in last 24 hours: Vitals:   02/21/22 1854 02/22/22 0516 02/22/22 0518 02/22/22 0930  BP: 125/72 (!) 144/88  130/61  Pulse:  (!) 102  89  Resp:      Temp:  97.9 F (36.6 C)    TempSrc:  Oral    SpO2: 100% 100%  100%  Weight:   (!) 153.1 kg   Height:       Supplemental O2: Room Air SpO2: 100 %   Physical Exam:  Constitutional: Patient sitting in bed in no acute distress HENT: normocephalic atraumatic, mucous membranes moist Eyes: conjunctiva non-erythematous Neck: JVD noted up to earlobe Cardiovascular: regular rate and rhythm, no m/r/g Pulmonary/Chest: normal work of breathing on room air, lungs clear to auscultation bilaterally Abdominal: soft, non-tender, non-distended MSK: normal bulk and tone Neurological: alert & oriented x 3 Skin: warm and dry Extremities: 2+ pitting edema to bilateral lower extremities  Filed Weights   02/21/22 0853 02/22/22 0518  Weight: (!) 156.7 kg (!) 153.1 kg     Intake/Output Summary (Last 24 hours) at 02/22/2022 1102 Last data filed at 02/22/2022 1000 Gross per 24 hour  Intake 930 ml  Output 4000 ml  Net -3070 ml   Net IO Since Admission: -3,070 mL [02/22/22 1102]  Pertinent Labs:     Latest Ref Rng & Units 02/22/2022   12:27 AM 02/21/2022    2:26 PM 02/07/2022   11:12 AM  CBC  WBC 4.0 - 10.5 K/uL 2.8  3.0  3.1   Hemoglobin 12.0 - 15.0 g/dL 10.8  11.2  10.7   Hematocrit 36.0 - 46.0 % 37.8  38.4  35.7   Platelets 150 - 400 K/uL 193  211  211        Latest Ref Rng & Units 02/22/2022   12:27 AM 02/21/2022    5:43 PM 02/21/2022    2:26 PM  CMP  Glucose 70 - 99 mg/dL 124   121   BUN 8 - 23 mg/dL 24   25   Creatinine 0.44 - 1.00 mg/dL 1.53   1.52   Sodium 135 - 145 mmol/L 139   140   Potassium 3.5 - 5.1 mmol/L 3.5   3.8   Chloride 98 - 111 mmol/L 100   102   CO2 22 - 32 mmol/L 29   29   Calcium 8.9 - 10.3 mg/dL 8.9   9.0   Total Protein 6.5 - 8.1 g/dL 7.4  8.7    Total Bilirubin 0.3 - 1.2 mg/dL 1.1  1.6    Alkaline Phos 38 - 126 U/L 90  113    AST 15 - 41 U/L 21  27    ALT 0 - 44 U/L 18  17  Imaging: No results found.  Assessment/Plan:   Principal Problem:   Acute HFrEF (heart failure with reduced ejection fraction) (HCC) Active Problems:   HTN (hypertension)   Diabetes mellitus without complication (HCC)   HLD (hyperlipidemia)   GERD (gastroesophageal reflux disease)   Cirrhosis ([place])   Patient Summary: Tina Lara is a 66 y.o. female with past medical history of HFrEF (EF 25 to 30%), GERD, questionable cirrhosis, hypertension, type 2 diabetes mellitus, hyperlipidemia who presented with concerns of shortness of breath.  Patient found in acute exacerbation of HFrEF.  Patient was admitted for further treatment and management.  #Acute HFrEF exacerbation Patient's most recent echo on 02/07/2022 showing left ventricular ejection fraction of 25 to 30% with mild asymmetric LVH as well as grade 3 diastolic dysfunction.  Patient is current classification NYHA III.  Patient also had right ventricular systolic function moderately reduced.  Patient had 80 mg IV Lasix with about 1900 mL output yesterday.  Slowly introduce GDMT.  Continue to hold lisinopril  until kidney function improves.  Once kidney function stabilizes, start Entresto and add spironolactone.  On exam today, patient does not have any crackles on exam.  Patient does have JVD as well as 2+ pitting edema.  Plan will be to continue to diurese. -Cardiology following, appreciate recommendations -Right/left heart cath on 11/24 -There was consideration for possible cardiac amyloidosis, but after discussion with heme-onc, this is less likely per H&P note -Continue diuresing with Lasix 80 mg twice daily -Start introducing GDMT after cardiac catheterization -Strict I's and O's -Daily weights -Start Farxiga 10 mg daily -Goal Mag >2, and goal K>4.0  #Prerenal AKI Patient's baseline creatinine is around 0.8-1.0.  Patient's creatinine up to 1.53 today.  Likely prerenal in the setting of acute heart failure exacerbation.  Patient will continue to diurese.  We will continue to monitor kidney function. -Continue to monitor BMP -Continue diuresing  #Hypertension Patient's home medications for hypertension include lisinopril 10 mg daily.  Patient's lisinopril currently held due to acute kidney injury.  Plan is actually to discontinue lisinopril, and start Entresto after cardiac catheterization.  Patient's blood pressure currently measuring into the 120s and 130s.  Patient does have some blood pressure into the 140s.  With Lasix, this should help with blood pressure as well. -Continue to monitor blood pressure -Hold lisinopril  #Hypomagnesemia Patient's mag 1.5. Will replete and recheck. Mag likely decreased due to Braxton with 2g  -Will recheck in AM  #GERD Patient's abdominal pain was likely secondary to GERD.  Patient reports that Protonix has significantly helped her abdominal pain and has no abdominal pain today.  We will continue with Protonix. -Continue Protonix 40 mg daily -Discontinue Carafate  #Type 2 diabetes mellitus Patient is a type II diabetic.  Patient's  home med includes Farxiga 10 mg.  Patient's most recent A1c was 7.8 on 01/15/2022.  Patient's glucose today has been ranging between 121-131.  Patient is currently on sliding scale insulin has only used 2 units in the past 24 hours.  Will not consider starting any long acting insulin at this time. -Continue sliding scale insulin -Continue Farxiga 10 mg daily -Continue to monitor blood glucose  #Possible cirrhosis Patient had an abdominal CT on 01/28/2022 showing possible cirrhosis on imaging.  Patient CMP within normal limits.  Patient does have decreased albumin at 3.0.  Liver enzymes within normal limits.  PTT 17 and INR 1.4.  Patient does not have an acute decompensation at this time,  but needs to be worked up further outpatient.  Patient does have outpatient ultrasound elastography liver pending. -Follow-up outpatient ultrasound elastography liver  #Iron deficiency anemia Patient has a history of iron deficiency anemia, and takes iron supplements at home.  Patient's iron deficit was around 800 mg.  Patient started on IV Ferrlecit.  Iron studies have come back within normal limits today.  Will continue with IV Ferrlecit for today and 2 more doses, and transition to oral afterwards.  Patient denies any melena, hematochezia, or hematemesis. -Today is day 2 of 4 of IV Ferrlecit -At day 4, plan to transition to oral iron supplement -Hemoglobin stable at 10.8  #Mild neutropenia and leukopenia Unclear etiology currently but CBC showing decreased white blood cell count at 2.8 and decreased neutrophil count of 1.6.  Unclear etiology, but could consider potential bone suppression.  Will obtain reticulocyte count. -Reticulocyte count pending -Repeat CBC  #Hyperlipidemia -Continue atorvastatin 40 mg nightly -Continue aspirin 81 mg daily  Diet: Heart Healthy IVF: None,None VTE: Enoxaparin Code: Full  Dispo: Anticipated discharge to Home in 3 days pending clinical improvement.   [facility] Resident PGY-1 [phone] Please contact the on call pager after 5 pm and on weekends at [phone].

## 2022-02-22 NOTE — Progress Notes (Signed)
Pt is denies feelings of hurting self

## 2022-02-22 NOTE — Progress Notes (Signed)
Progress Note  Patient Name: Tina Lara Date of Encounter: 02/22/2022  Primary Cardiologist: Janina Mayo, MD  Subjective   No acute events overnight. Patient urinated 2.9 L with a net negative of 3.3 L in the last 24 hours. Symptomatically, she said she feels better in terms of her breathing status.  Inpatient Medications    Scheduled Meds:  acetaminophen  650 mg Oral Q6H   aspirin EC  81 mg Oral Daily   atorvastatin  40 mg Oral QHS   enoxaparin (LOVENOX) injection  80 mg Subcutaneous Q24H   furosemide  80 mg Intravenous BID   insulin aspart  0-5 Units Subcutaneous QHS   insulin aspart  0-9 Units Subcutaneous TID WC   pantoprazole  40 mg Oral Daily   senna-docusate  1 tablet Oral Daily   Continuous Infusions:  ferric gluconate (FERRLECIT) IVPB     PRN Meds: bisacodyl, ondansetron (ZOFRAN) IV   Vital Signs    Vitals:   02/21/22 0853 02/21/22 1854 02/22/22 0516 02/22/22 0518  BP: (!) 142/78 125/72 (!) 144/88   Pulse: 87  (!) 102   Resp: 18     Temp: 97.9 F (36.6 C)  97.9 F (36.6 C)   TempSrc: Oral  Oral   SpO2: 97% 100% 100%   Weight: (!) 156.7 kg   (!) 153.1 kg  Height: '5\' 9"'$  (1.753 m)       Intake/Output Summary (Last 24 hours) at 02/22/2022 0812 Last data filed at 02/22/2022 0519 Gross per 24 hour  Intake 570 ml  Output 2900 ml  Net -2330 ml   Filed Weights   02/21/22 0853 02/22/22 0518  Weight: (!) 156.7 kg (!) 153.1 kg    Telemetry     Personally reviewed, normal sinus rhythm and heart rates controlled  ECG   EKG on 01/28/22 showed normal sinus rhythm  Physical Exam   GEN: No acute distress.   Neck: JVD elevated beyond the earlobes Cardiac: RRR, no murmur, rub, or gallop.  Respiratory: Bibasilar Rales present GI: Soft, nontender, bowel sounds present. MS: 2+ pitting edema in the lower extremities no deformity. Neuro:  Nonfocal. Psych: Alert and oriented x 3. Normal affect.  Labs    Chemistry Recent Labs  Lab  02/20/22 1008 02/21/22 1426 02/21/22 1743 02/22/22 0027  NA 141 140  --  139  K 4.3 3.8  --  3.5  CL 102 102  --  100  CO2 29 29  --  29  GLUCOSE 131* 121*  --  124*  BUN 28* 25*  --  24*  CREATININE 1.57* 1.52*  --  1.53*  CALCIUM 8.9 9.0  --  8.9  PROT 7.5  --  8.7* 7.4  ALBUMIN 3.0*  --  3.6 3.0*  AST 24  --  27 21  ALT 17  --  17 18  ALKPHOS 105  --  113 90  BILITOT 1.2  --  1.6* 1.1  GFRNONAA 37* 38*  --  38*  ANIONGAP 10 9  --  10     Hematology Recent Labs  Lab 02/21/22 1426 02/22/22 0027  WBC 3.0* 2.8*  RBC 4.63 4.54  HGB 11.2* 10.8*  HCT 38.4 37.8  MCV 82.9 83.3  MCH 24.2* 23.8*  MCHC 29.2* 28.6*  RDW 17.0* 16.9*  PLT 211 193    Cardiac Enzymes Recent Labs  Lab 01/28/22 0741 01/28/22 0944  TROPONINIHS 17 17    BNP Recent Labs  Lab 02/20/22 1008  BNP 1,423.2*     DDimerNo results for input(s): "DDIMER" in the last 168 hours.   Radiology    No results found.  Cardiac Studies  Echo from 02/07/2022 LVEF 25 to 30% Mild asymmetric LVH Grade 3 diastolic dysfunction RV systolic function is moderately reduced Mildly elevated pulmonary artery systolic pressure Tricuspid valve regurgitation is moderate to severe No evidence of mitral valve regurgitation  Assessment & Plan    Patient is a 66 year old F known to have new onset biventricular heart failure, HTN, DM2 was sent from the cardiology clinic for management of ADHF.  # Wet and warm/ADHF/new onset cardiomyopathy LVEF 25 to 30% with no device/NYHA III/ACC AHA stage C, currently decompensated -Continue diuresis with IV Lasix 80 mg twice daily -Daily weights, ins and outs and 1.2 L fluid restriction -Patient is scheduled tentatively for RHC and LHC on 02/23/2022 if she is near compensated. Patient is already consented for cardiac catheterization  # HTN, controlled -Hold antihypertensive medications until euvolemic and we will reintroduce GDMT after adequate diuresis.  # HLD -Continue  atorvastatin 40 mg nightly  I have spent a total of 35 minutes with patient reviewing chart , telemetry, EKGs, labs and examining patient as well as establishing an assessment and plan that was discussed with the patient.  > 50% of time was spent in direct patient care.    Signed, Chalmers Guest, MD  02/22/2022, 8:12 AM

## 2022-02-23 ENCOUNTER — Encounter (HOSPITAL_COMMUNITY): Payer: Self-pay | Admitting: Interventional Cardiology

## 2022-02-23 ENCOUNTER — Encounter (HOSPITAL_COMMUNITY)
Admission: AD | Disposition: A | Discharge status: Home Health | Payer: Self-pay | Attending: Student in an Organized Health Care Education/Training Program

## 2022-02-23 DIAGNOSIS — Z87891 Personal history of nicotine dependence: Secondary | ICD-10-CM | POA: Diagnosis not present

## 2022-02-23 DIAGNOSIS — I110 Hypertensive heart disease with heart failure: Secondary | ICD-10-CM | POA: Diagnosis not present

## 2022-02-23 DIAGNOSIS — I5021 Acute systolic (congestive) heart failure: Secondary | ICD-10-CM | POA: Diagnosis not present

## 2022-02-23 DIAGNOSIS — I2510 Atherosclerotic heart disease of native coronary artery without angina pectoris: Secondary | ICD-10-CM

## 2022-02-23 HISTORY — PX: RIGHT/LEFT HEART CATH AND CORONARY ANGIOGRAPHY: CATH118266

## 2022-02-23 LAB — POCT I-STAT EG7
Acid-Base Excess: 6 mmol/L — ABNORMAL HIGH (ref 0.0–2.0)
Acid-Base Excess: 6 mmol/L — ABNORMAL HIGH (ref 0.0–2.0)
Bicarbonate: 32.5 mmol/L — ABNORMAL HIGH (ref 20.0–28.0)
Bicarbonate: 32.3 mmol/L — ABNORMAL HIGH (ref 20.0–28.0)
Calcium, Ion: 1.16 mmol/L (ref 1.15–1.40)
Calcium, Ion: 1.17 mmol/L (ref 1.15–1.40)
HCT: 38 % (ref 36.0–46.0)
HCT: 38 % (ref 36.0–46.0)
Hemoglobin: 12.9 g/dL (ref 12.0–15.0)
Hemoglobin: 12.9 g/dL (ref 12.0–15.0)
O2 Saturation: 68 %
O2 Saturation: 67 %
Potassium: 3.3 mmol/L — ABNORMAL LOW (ref 3.5–5.1)
Potassium: 3.4 mmol/L — ABNORMAL LOW (ref 3.5–5.1)
Sodium: 143 mmol/L (ref 135–145)
Sodium: 143 mmol/L (ref 135–145)
TCO2: 34 mmol/L — ABNORMAL HIGH (ref 22–32)
TCO2: 34 mmol/L — ABNORMAL HIGH (ref 22–32)
pCO2, Ven: 52.5 mmHg (ref 44–60)
pCO2, Ven: 52.3 mmHg (ref 44–60)
pH, Ven: 7.4 (ref 7.25–7.43)
pH, Ven: 7.398 (ref 7.25–7.43)
pO2, Ven: 36 mmHg (ref 32–45)
pO2, Ven: 36 mmHg (ref 32–45)

## 2022-02-23 LAB — CBC WITH DIFFERENTIAL/PLATELET
Abs Immature Granulocytes: 0.01 10*3/uL (ref 0.00–0.07)
Basophils Absolute: 0 10*3/uL (ref 0.0–0.1)
Basophils Relative: 1 %
Eosinophils Absolute: 0.1 10*3/uL (ref 0.0–0.5)
Eosinophils Relative: 3 %
HCT: 40.5 % (ref 36.0–46.0)
Hemoglobin: 11.6 g/dL — ABNORMAL LOW (ref 12.0–15.0)
Immature Granulocytes: 0 %
Lymphocytes Relative: 14 %
Lymphs Abs: 0.4 10*3/uL — ABNORMAL LOW (ref 0.7–4.0)
MCH: 23.9 pg — ABNORMAL LOW (ref 26.0–34.0)
MCHC: 28.6 g/dL — ABNORMAL LOW (ref 30.0–36.0)
MCV: 83.3 fL (ref 80.0–100.0)
Monocytes Absolute: 0.5 10*3/uL (ref 0.1–1.0)
Monocytes Relative: 17 %
Neutro Abs: 1.9 10*3/uL (ref 1.7–7.7)
Neutrophils Relative %: 65 %
Platelets: 187 10*3/uL (ref 150–400)
RBC: 4.86 MIL/uL (ref 3.87–5.11)
RDW: 16.9 % — ABNORMAL HIGH (ref 11.5–15.5)
WBC: 2.9 10*3/uL — ABNORMAL LOW (ref 4.0–10.5)
nRBC: 0 % (ref 0.0–0.2)

## 2022-02-23 LAB — POCT I-STAT 7, (LYTES, BLD GAS, ICA,H+H)
Acid-Base Excess: 5 mmol/L — ABNORMAL HIGH (ref 0.0–2.0)
Bicarbonate: 31 mmol/L — ABNORMAL HIGH (ref 20.0–28.0)
Calcium, Ion: 1.15 mmol/L (ref 1.15–1.40)
HCT: 37 % (ref 36.0–46.0)
Hemoglobin: 12.6 g/dL (ref 12.0–15.0)
O2 Saturation: 91 %
Potassium: 3.3 mmol/L — ABNORMAL LOW (ref 3.5–5.1)
Sodium: 142 mmol/L (ref 135–145)
TCO2: 32 mmol/L (ref 22–32)
pCO2 arterial: 49.5 mmHg — ABNORMAL HIGH (ref 32–48)
pH, Arterial: 7.404 (ref 7.35–7.45)
pO2, Arterial: 61 mmHg — ABNORMAL LOW (ref 83–108)

## 2022-02-23 LAB — GLUCOSE, CAPILLARY
Glucose-Capillary: 105 mg/dL — ABNORMAL HIGH (ref 70–99)
Glucose-Capillary: 103 mg/dL — ABNORMAL HIGH (ref 70–99)
Glucose-Capillary: 107 mg/dL — ABNORMAL HIGH (ref 70–99)
Glucose-Capillary: 148 mg/dL — ABNORMAL HIGH (ref 70–99)
Glucose-Capillary: 140 mg/dL — ABNORMAL HIGH (ref 70–99)

## 2022-02-23 LAB — COMPREHENSIVE METABOLIC PANEL
ALT: 18 U/L (ref 0–44)
AST: 23 U/L (ref 15–41)
Albumin: 3.3 g/dL — ABNORMAL LOW (ref 3.5–5.0)
Alkaline Phosphatase: 99 U/L (ref 38–126)
Anion gap: 13 (ref 5–15)
BUN: 22 mg/dL (ref 8–23)
CO2: 29 mmol/L (ref 22–32)
Calcium: 9.1 mg/dL (ref 8.9–10.3)
Chloride: 99 mmol/L (ref 98–111)
Creatinine, Ser: 1.66 mg/dL — ABNORMAL HIGH (ref 0.44–1.00)
GFR, Estimated: 34 mL/min — ABNORMAL LOW (ref >60)
Glucose, Bld: 131 mg/dL — ABNORMAL HIGH (ref 70–99)
Potassium: 3.3 mmol/L — ABNORMAL LOW (ref 3.5–5.1)
Sodium: 141 mmol/L (ref 135–145)
Total Bilirubin: 1 mg/dL (ref 0.3–1.2)
Total Protein: 8 g/dL (ref 6.5–8.1)

## 2022-02-23 LAB — MAGNESIUM: Magnesium: 2.2 mg/dL (ref 1.7–2.4)

## 2022-02-23 SURGERY — RIGHT/LEFT HEART CATH AND CORONARY ANGIOGRAPHY
Anesthesia: LOCAL

## 2022-02-23 MED ORDER — SODIUM CHLORIDE 0.9% FLUSH: 3.0000 mL | INTRAVENOUS | Status: DC | PRN

## 2022-02-23 MED ORDER — ASPIRIN 81 MG PO CHEW
81.0000 mg | CHEWABLE_TABLET | ORAL | Status: AC
  Administered 2022-02-23: 81 mg via ORAL
  Filled 2022-02-23: qty 1

## 2022-02-23 MED ORDER — LIDOCAINE HCL (PF) 1 % IJ SOLN
INTRAMUSCULAR | Status: AC
  Filled 2022-02-23: qty 30

## 2022-02-23 MED ORDER — SODIUM CHLORIDE 0.9 % IV SOLN
125.0000 mg | Freq: Every day | INTRAVENOUS | Status: AC
  Administered 2022-02-23 – 2022-02-24 (×2): 125 mg via INTRAVENOUS
  Filled 2022-02-23 (×2): qty 10

## 2022-02-23 MED ORDER — VERAPAMIL HCL 2.5 MG/ML IV SOLN
INTRAVENOUS | Status: AC
  Filled 2022-02-23: qty 2

## 2022-02-23 MED ORDER — HEPARIN (PORCINE) IN NACL 1000-0.9 UT/500ML-% IV SOLN
INTRAVENOUS | Status: DC | PRN
  Administered 2022-02-23 (×2): 500 mL

## 2022-02-23 MED ORDER — SODIUM CHLORIDE 0.9 % IV SOLN: 250.0000 mL | INTRAVENOUS | Status: DC | PRN

## 2022-02-23 MED ORDER — FUROSEMIDE 10 MG/ML IJ SOLN: 40.0000 mg | Freq: Every day | INTRAMUSCULAR | Status: DC

## 2022-02-23 MED ORDER — BISACODYL 5 MG PO TBEC
5.0000 mg | DELAYED_RELEASE_TABLET | Freq: Every day | ORAL | Status: DC | PRN
  Administered 2022-02-23: 5 mg via ORAL
  Filled 2022-02-23: qty 1

## 2022-02-23 MED ORDER — ENOXAPARIN SODIUM 80 MG/0.8ML IJ SOSY
75.0000 mg | PREFILLED_SYRINGE | INTRAMUSCULAR | Status: DC
  Administered 2022-02-23 – 2022-02-26 (×4): 75 mg via SUBCUTANEOUS
  Filled 2022-02-23 (×4): qty 0.8

## 2022-02-23 MED ORDER — FENTANYL CITRATE (PF) 100 MCG/2ML IJ SOLN
INTRAMUSCULAR | Status: AC
  Filled 2022-02-23: qty 2

## 2022-02-23 MED ORDER — POTASSIUM CHLORIDE CRYS ER 20 MEQ PO TBCR
40.0000 meq | EXTENDED_RELEASE_TABLET | Freq: Two times a day (BID) | ORAL | Status: AC
  Administered 2022-02-23 (×2): 40 meq via ORAL
  Filled 2022-02-23 (×2): qty 2

## 2022-02-23 MED ORDER — IOHEXOL 350 MG/ML SOLN
INTRAVENOUS | Status: DC | PRN
  Administered 2022-02-23: 55 mL

## 2022-02-23 MED ORDER — MIDAZOLAM HCL 2 MG/2ML IJ SOLN
INTRAMUSCULAR | Status: DC | PRN
  Administered 2022-02-23: 1 mg via INTRAVENOUS

## 2022-02-23 MED ORDER — VERAPAMIL HCL 2.5 MG/ML IV SOLN
INTRAVENOUS | Status: DC | PRN
  Administered 2022-02-23: 10 mL via INTRA_ARTERIAL

## 2022-02-23 MED ORDER — MIDAZOLAM HCL 2 MG/2ML IJ SOLN
INTRAMUSCULAR | Status: AC
  Filled 2022-02-23: qty 2

## 2022-02-23 MED ORDER — FENTANYL CITRATE (PF) 100 MCG/2ML IJ SOLN
INTRAMUSCULAR | Status: DC | PRN
  Administered 2022-02-23: 25 ug via INTRAVENOUS

## 2022-02-23 MED ORDER — HEPARIN SODIUM (PORCINE) 1000 UNIT/ML IJ SOLN
INTRAMUSCULAR | Status: AC
  Filled 2022-02-23: qty 10

## 2022-02-23 MED ORDER — SODIUM CHLORIDE 0.9 % IV SOLN: INTRAVENOUS | Status: DC

## 2022-02-23 MED ORDER — CARVEDILOL 6.25 MG PO TABS
6.2500 mg | ORAL_TABLET | Freq: Two times a day (BID) | ORAL | Status: DC
  Administered 2022-02-24 – 2022-02-27 (×7): 6.25 mg via ORAL
  Filled 2022-02-23 (×7): qty 1

## 2022-02-23 MED ORDER — HEPARIN (PORCINE) IN NACL 1000-0.9 UT/500ML-% IV SOLN
INTRAVENOUS | Status: AC
  Filled 2022-02-23: qty 1000

## 2022-02-23 MED ORDER — HEPARIN SODIUM (PORCINE) 1000 UNIT/ML IJ SOLN
INTRAMUSCULAR | Status: DC | PRN
  Administered 2022-02-23: 6000 [IU] via INTRAVENOUS

## 2022-02-23 MED ORDER — FUROSEMIDE 10 MG/ML IJ SOLN
40.0000 mg | Freq: Two times a day (BID) | INTRAMUSCULAR | Status: DC
  Administered 2022-02-23 – 2022-02-24 (×2): 40 mg via INTRAVENOUS
  Filled 2022-02-23 (×2): qty 4

## 2022-02-23 MED ORDER — LIDOCAINE HCL (PF) 1 % IJ SOLN
INTRAMUSCULAR | Status: DC | PRN
  Administered 2022-02-23: 5 mL

## 2022-02-23 MED ORDER — SODIUM CHLORIDE 0.9% FLUSH: 3.0000 mL | Freq: Two times a day (BID) | INTRAVENOUS | Status: DC

## 2022-02-23 SURGICAL SUPPLY — 12 items
CATH 5FR JL3.5 JR4 ANG PIG MP (CATHETERS) IMPLANT
CATH SWAN GANZ 7F STRAIGHT (CATHETERS) IMPLANT
DEVICE RAD COMP TR BAND LRG (VASCULAR PRODUCTS) IMPLANT
GLIDESHEATH SLEND A-KIT 6F 22G (SHEATH) IMPLANT
GLIDESHEATH SLENDER 7FR .021G (SHEATH) IMPLANT
GUIDEWIRE INQWIRE 1.5J.035X260 (WIRE) IMPLANT
INQWIRE 1.5J .035X260CM (WIRE) ×1
KIT HEART LEFT (KITS) ×1 IMPLANT
PACK CARDIAC CATHETERIZATION (CUSTOM PROCEDURE TRAY) ×1 IMPLANT
SHEATH PROBE COVER 6X72 (BAG) IMPLANT
TRANSDUCER W/STOPCOCK (MISCELLANEOUS) ×1 IMPLANT
TUBING CIL FLEX 10 FLL-RA (TUBING) ×1 IMPLANT

## 2022-02-23 NOTE — Progress Notes (Signed)
[place] for Lovenox - obesity dosing Indication: VTE prophylaxis  Allergies  Allergen Reactions   Percocet [Oxycodone-Acetaminophen] Itching    Patient Measurements: Height: '5\' 9"'$  (175.3 cm) Weight: (!) 148.9 kg (328 lb 4.8 oz) IBW/kg (Calculated) : 66.2  Vital Signs: Temp: 98.7 F (37.1 C) (11/24 0440) Temp Source: Oral (11/24 0440) BP: 119/57 (11/24 0912) Pulse Rate: 83 (11/24 0912)  Labs: Recent Labs    02/21/22 1426 02/22/22 0027 02/22/22 1420 02/23/22 0035  HGB 11.2* 10.8* 11.5* 11.6*  HCT 38.4 37.8 40.3 40.5  PLT 211 193 209 187  LABPROT  --  17.0*  --   --   INR  --  1.4*  --   --   CREATININE 1.52* 1.53*  --  1.66*     Estimated Creatinine Clearance: 53.7 mL/min (A) (by C-G formula based on SCr of 1.66 mg/dL (H)).   Medical History: Past Medical History:  Diagnosis Date   Anxiety    Cataract    NS OD   Chest pain 47/42/5956   Complication of anesthesia    Per patient "needs a lot of anesthesia w/surgery" ,I had toruble with a spinal block not taking "   Diabetes mellitus without complication (HCC)    Type 2   Diabetic retinopathy (HCC)    BDR   Full dentures    GERD (gastroesophageal reflux disease)    Heart failure ([place])    History of surgery on arm    right arm broken   Hyperlipidemia    Hypertension    never diagnosed by physician per pt   Macular degeneration    Obesity      Assessment: 66 yo female admitted for CHF, pharmacy asked to add Lovenox for VTE prophylaxis.  BMI 51.  Wt has trended down with diuresis.  Goal of Therapy:   Monitor platelets by anticoagulation protocol: Yes   Plan:  Adjust Lovenox to '75mg'$  SQ q24h  Arrie Senate, PharmD, BCPS, [facility] Clinical Pharmacist [phone] Please check AMION for all [place] numbers 02/23/2022

## 2022-02-23 NOTE — Interval H&P Note (Signed)
Cath Lab Visit (complete for each Cath Lab visit)  Clinical Evaluation Leading to the Procedure:   ACS: No.  Non-ACS:    Anginal Classification: CCS Lara  Anti-ischemic medical therapy: Minimal Therapy (1 class of medications)  Non-Invasive Test Results: No non-invasive testing performed  Prior CABG: No previous CABG      History and Physical Interval Note:  02/23/2022 8:28 AM  Tina Lara  has presented today for surgery, with the diagnosis of hf.  The various methods of treatment have been discussed with the patient and family. After consideration of risks, benefits and other options for treatment, the patient has consented to  Procedure(s): RIGHT/LEFT HEART CATH AND CORONARY ANGIOGRAPHY (N/A) as a surgical intervention.  The patient's history has been reviewed, patient examined, no change in status, stable for surgery.  I have reviewed the patient's chart and labs.  Questions were answered to the patient's satisfaction.     Tina Lara

## 2022-02-23 NOTE — Progress Notes (Signed)
Rounding Note    Patient Name: Tina Lara Date of Encounter: 02/23/2022  [place] Cardiologist: Janina Mayo, MD   Subjective   Seen immediately after cath. No CV complaints at rest. No CAD. Elevated filling pressures still, despite almost 8 liters net diuresis. PAH explained by left heart failure.  Inpatient Medications    Scheduled Meds:  aspirin EC  81 mg Oral Daily   atorvastatin  40 mg Oral QHS   dapagliflozin propanediol  10 mg Oral Daily   enoxaparin (LOVENOX) injection  75 mg Subcutaneous Q24H   furosemide  80 mg Intravenous BID   insulin aspart  0-5 Units Subcutaneous QHS   insulin aspart  0-9 Units Subcutaneous TID WC   pantoprazole  40 mg Oral Daily   potassium chloride  40 mEq Oral BID   senna-docusate  1 tablet Oral Daily   Continuous Infusions:  ferric gluconate (FERRLECIT) IVPB     PRN Meds: acetaminophen, bisacodyl, ondansetron (ZOFRAN) IV   Vital Signs    Vitals:   02/23/22 0902 02/23/22 0907 02/23/22 0912 02/23/22 0941  BP: 118/71 121/86 (!) 119/57 (!) 114/49  Pulse: 80 82 83 79  Resp: '16 17 11 13  '$ Temp:    97.6 F (36.4 C)  TempSrc:    Oral  SpO2: 91% 92% 91% 92%  Weight:      Height:        Intake/Output Summary (Last 24 hours) at 02/23/2022 0950 Last data filed at 02/23/2022 0602 Gross per 24 hour  Intake 635.94 ml  Output 8350 ml  Net -7714.06 ml      02/23/2022    4:40 AM 02/22/2022    5:18 AM 02/21/2022    8:53 AM  Last 3 Weights  Weight (lbs) 328 lb 4.8 oz 337 lb 8 oz 345 lb 7.4 oz  Weight (kg) 148.916 kg 153.089 kg 156.7 kg      Telemetry    NSR - Personally Reviewed  ECG    No new tracing - Personally Reviewed  Physical Exam  Morbid obesity GEN: No acute distress.   Neck: No JVD Cardiac: RRR, no murmurs, rubs, or gallops.  Respiratory: Clear to auscultation bilaterally. GI: Soft, nontender, non-distended  MS: 2+ edema; No deformity. Neuro:  Nonfocal  Psych: Normal affect   Labs     High Sensitivity Troponin:   Recent Labs  Lab 01/28/22 0741 01/28/22 0944  TROPONINIHS 17 17     Chemistry Recent Labs  Lab 02/21/22 1426 02/21/22 1743 02/22/22 0024 02/22/22 0027 02/23/22 0035  NA 140  --   --  139 141  K 3.8  --   --  3.5 3.3*  CL 102  --   --  100 99  CO2 29  --   --  29 29  GLUCOSE 121*  --   --  124* 131*  BUN 25*  --   --  24* 22  CREATININE 1.52*  --   --  1.53* 1.66*  CALCIUM 9.0  --   --  8.9 9.1  MG  --  1.9 1.8  --  2.2  PROT  --  8.7*  --  7.4 8.0  ALBUMIN  --  3.6  --  3.0* 3.3*  AST  --  27  --  21 23  ALT  --  17  --  18 18  ALKPHOS  --  113  --  90 99  BILITOT  --  1.6*  --  1.1 1.0  GFRNONAA 38*  --   --  38* 34*  ANIONGAP 9  --   --  10 13    Lipids No results for input(s): "CHOL", "TRIG", "HDL", "LABVLDL", "LDLCALC", "CHOLHDL" in the last 168 hours.  Hematology Recent Labs  Lab 02/22/22 0027 02/22/22 1420 02/23/22 0035  WBC 2.8* 3.0* 2.9*  RBC 4.54  4.46 4.84 4.86  HGB 10.8* 11.5* 11.6*  HCT 37.8 40.3 40.5  MCV 83.3 83.3 83.3  MCH 23.8* 23.8* 23.9*  MCHC 28.6* 28.5* 28.6*  RDW 16.9* 17.0* 16.9*  PLT 193 209 187   Thyroid No results for input(s): "TSH", "FREET4" in the last 168 hours.  BNP Recent Labs  Lab 02/20/22 1008  BNP 1,423.2*    DDimer No results for input(s): "DDIMER" in the last 168 hours.   Radiology     CARDIAC CATHETERIZATION  Result Date: 02/23/2022 CONCLUSIONS: Left heart failure with high cardiac output 9.46 L/min with LVEDP 21 mmHg. Mild pulmonary hypertension with mean PA pressure 34 mmHg.  Pulmonary capillary wedge pressure mean 23 mmHg; pulmonary vascular resistance 1.16 Woods units.  WHO group 2 pulmonary hypertension etiology. Left dominant coronary anatomy.  No obstructive disease.  Mid LAD 40 to 50%. RECOMMENDATIONS: Exclude causes for high cardiac output such as systemic AV shunts or intrapulmonary shunting. Guideline directed therapy for systolic heart failure.    Cardiac Studies   R  and L heart cath  [facility] Cardiac Output 9.46 L/min  [facility] Cardiac Output Index 3.67 (L/min)/BSA  RA A Wave 13 mmHg  RA V Wave 11 mmHg  RA Mean 10 mmHg  RV Systolic Pressure 53 mmHg  RV Diastolic Pressure 4 mmHg  RV EDP 25 mmHg  PA Systolic Pressure 49 mmHg  PA Diastolic Pressure 11 mmHg  PA Mean 34 mmHg  PW A Wave 26 mmHg  PW V Wave 35 mmHg  PW Mean 23 mmHg  AO Systolic Pressure 355 mmHg  AO Diastolic Pressure 67 mmHg  AO Mean 92 mmHg  LV Systolic Pressure 732 mmHg  LV Diastolic Pressure 7 mmHg  LV EDP 20 mmHg  AOp Systolic Pressure 202 mmHg  AOp Diastolic Pressure 61 mmHg  AOp Mean Pressure 90 mmHg  LVp Systolic Pressure 542 mmHg  LVp Diastolic Pressure 6 mmHg  LVp EDP Pressure 21 mmHg  QP/QS 1  TPVR Index 9.27 HRUI  TSVR Index 25.1 HRUI  PVR SVR Ratio 0.13  TPVR/TSVR Ratio 0.37   Echo: 02/07/22   1. Left ventricular ejection fraction, by estimation, is 25 to 30%. The  left ventricle has severely decreased function. The left ventricle  demonstrates global hypokinesis. There is mild asymmetric left ventricular  hypertrophy of the inferior segment.  Left ventricular diastolic parameters are consistent with Grade III  diastolic dysfunction (restrictive). Elevated left ventricular  end-diastolic pressure.   2. Right ventricular systolic function is moderately reduced. The right  ventricular size is mildly enlarged. There is mildly elevated pulmonary  artery systolic pressure.   3. The mitral valve is normal in structure. No evidence of mitral valve  regurgitation. No evidence of mitral stenosis.   4. Tricuspid valve regurgitation is moderate to severe.   5. The aortic valve is normal in structure. Aortic valve regurgitation is  mild. Aortic valve sclerosis/calcification is present, without any  evidence of aortic stenosis.   6. There is mild dilatation of the ascending aorta, measuring 37 mm.   7. The inferior vena cava is normal in size with greater than 50%  respiratory variability, suggesting right atrial pressure of 3 mmHg.    Patient Profile     66 y.o. female with cirrhosis, morbid obesity, HTN, DM/HLP with newly recognized HFrEF.  Assessment & Plan    CHF:  biventricular manifestations. Still hypervolemic. Continue diuretics. CMP: nonischemic; high CO likely explained by cirrhosis, anemia and obesity. The latter is likely to be the dominant cause of her CMP. 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 HTN: long term prefer use of entresto, carvedilol, spironolactone, diuretics. Start carvedilol tomorrow. Hold off entresto and spironolactone while we aggressively diurese. DM: fair, but not ideal control. Continue atorvastatin. Cirrhosis: RA pressure is not that high, compared to left heart pressures. This is not likely to be cardiac cirrhosis. Could be due to NASH. Aortic atherosclerosis: mild, documented on CT. Normal caliber aorta.     For questions or updates, please contact [place] Please consult [email or web address] for contact info under        Signed, Sanda Klein, MD  02/23/2022, 9:50 AM

## 2022-02-23 NOTE — CV Procedure (Signed)
Mild pulmonary hypertension with PA mean 34 mmHg.,  Likely WHO group 2.  Capillary wedge pressure mean 23 mmHg; LVEDP 20 mmHg; cardiac output cardiac output 9.5 L/min; cardiac index 3.67 L/min/m. PA saturation 68% and aortic O2 saturation 91% Pulmonary vascular resistance 1.16 Woods units Widely patent coronary arteries with irregularities noted in the mid LAD.  Left dominant circulatory anatomy. LV gram was not performed. 45 cc of contrast used  Surprisingly, patient has high cardiac output with 9.5 L/min, suggesting High Output Heart failure.  Etiology for high output is uncertain.  High output failure is intuitively incorrect given severe decrease in LV function.  Not able to detect any airway and sampling and calculations.

## 2022-02-23 NOTE — Progress Notes (Signed)
HD#2 Subjective:   Summary: This is a 66 year old female with past medical history of HFrEF (EF 25 to 30%), GERD, questionable cirrhosis, hypertension, type 2 diabetes mellitus, hyperlipidemia who presented with concerns of shortness of breath.  Patient found in acute exacerbation of HFrEF.  Patient was admitted for further treatment and management.  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  Overnight Events: No acute overnight events  Patient endorses that her right heart cath went well.  She currently has no concerns.  She states that this morning around 3:00, she did get a little lightheaded when she stood up too fast.  She denies any sweats, chest pain, shortness of breath, or any complaints at this time.  Patient is stating that she would like to know when she gets to go home.  Patient reports great appetite.  No other concerns at this time.  Objective:  Vital signs in last 24 hours: Vitals:   02/23/22 0941 02/23/22 1000 02/23/22 1051 02/23/22 1100  BP: (!) 114/49 97/66 (!) 127/55 131/60  Pulse: 79   81  Resp: '13 13  15  '$ Temp: 97.6 F (36.4 C)     TempSrc: Oral     SpO2: 92%   97%  Weight:      Height:       Supplemental O2: Room Air SpO2: 97 %   Physical Exam:  Constitutional: Patient sitting in bed in no acute distress HENT: normocephalic atraumatic, mucous membranes moist Eyes: conjunctiva non-erythematous Neck: JVD noted up to earlobe Cardiovascular: regular rate and rhythm, no m/r/g Pulmonary/Chest: normal work of breathing on room air, lungs clear to auscultation bilaterally Abdominal: soft, non-tender, non-distended MSK: normal bulk and tone Neurological: alert & oriented x 3 Skin: warm and dry Extremities: 2+ pitting edema to bilateral lower extremities  Filed Weights   02/21/22 0853 02/22/22 0518 02/23/22 0440  Weight: (!) 156.7 kg (!) 153.1 kg (!) 148.9 kg     Intake/Output Summary (Last 24 hours) at  02/23/2022 1138 Last data filed at 02/23/2022 0602 Gross per 24 hour  Intake 635.94 ml  Output 7650 ml  Net -7014.06 ml   Net IO Since Admission: -10,084.06 mL [02/23/22 1138]  Pertinent Labs:    Latest Ref Rng & Units 02/23/2022   12:35 AM 02/22/2022    2:20 PM 02/22/2022   12:27 AM  CBC  WBC 4.0 - 10.5 K/uL 2.9  3.0  2.8   Hemoglobin 12.0 - 15.0 g/dL 11.6  11.5  10.8   Hematocrit 36.0 - 46.0 % 40.5  40.3  37.8   Platelets 150 - 400 K/uL 187  209  193        Latest Ref Rng & Units 02/23/2022   12:35 AM 02/22/2022   12:27 AM 02/21/2022    5:43 PM  CMP  Glucose 70 - 99 mg/dL 131  124    BUN 8 - 23 mg/dL 22  24    Creatinine 0.44 - 1.00 mg/dL 1.66  1.53    Sodium 135 - 145 mmol/L 141  139    Potassium 3.5 - 5.1 mmol/L 3.3  3.5    Chloride 98 - 111 mmol/L 99  100    CO2 22 - 32 mmol/L 29  29    Calcium 8.9 - 10.3 mg/dL 9.1  8.9    Total Protein 6.5 - 8.1 g/dL 8.0  7.4  8.7   Total Bilirubin 0.3 - 1.2 mg/dL 1.0  1.1  1.6   Alkaline Phos 38 - 126 U/L 99  90  113   AST 15 - 41 U/L '23  21  27   '$ ALT 0 - 44 U/L '18  18  17     '$ Imaging: CARDIAC CATHETERIZATION  Result Date: 02/23/2022 CONCLUSIONS: Left heart failure with high cardiac output 9.46 L/min with LVEDP 21 mmHg. Mild pulmonary hypertension with mean PA pressure 34 mmHg.  Pulmonary capillary wedge pressure mean 23 mmHg; pulmonary vascular resistance 1.16 Woods units.  WHO group 2 pulmonary hypertension etiology. Left dominant coronary anatomy.  No obstructive disease.  Mid LAD 40 to 50%. RECOMMENDATIONS: Exclude causes for high cardiac output such as systemic AV shunts or intrapulmonary shunting. Guideline directed therapy for systolic heart failure.    Assessment/Plan:   Principal Problem:   Acute HFrEF (heart failure with reduced ejection fraction) (HCC) Active Problems:   HTN (hypertension)   Diabetes mellitus without complication (HCC)   HLD (hyperlipidemia)   GERD (gastroesophageal reflux disease)    Cirrhosis ([place])   Patient Summary: Tina Lara is a 66 y.o. female with past medical history of HFrEF (EF 25 to 30%), GERD, questionable cirrhosis, hypertension, type 2 diabetes mellitus, hyperlipidemia who presented with concerns of shortness of breath.  Patient found in acute exacerbation of HFrEF.  Patient was admitted for further treatment and management.  #High-output cardiac failure Patient had cardiac cath today showing high output cardiac failure.  Patient also had WHO class II pulmonary artery hypertension.  Patient has had output of 7000 mL yesterday after getting 80 mg IV Lasix twice daily.  Patient did report that she had some lightheadedness when she stood up this early morning.  This could be in the setting of volume depletion.  Will decrease diuresing today.  Patient denies any respiratory issues today.  Patient states that she has been walking around with no concerns.  She denies any shortness of breath.  On exam today, patient still has some JVD as well as 2+ pitting edema.  We will continue to gently diurese with 40 mg IV Lasix.  Plan to slowly introduce GDMT.  Farxiga 10 mg daily is already on.  Plan to start carvedilol tomorrow.  Once diuresing is done and AKI is resolved, plan to start Entresto and spironolactone. -Cardiology following, appreciate recommendations -Right/left heart cath today -Continue diuresing with Lasix 40 mg daily  -Strict I's and O's -Daily weights -Conitnue Farxiga 10 mg daily -Start carvedilol 6.25 mg twice daily tomorrow -Goal Mag >2, and goal K>4.0  #WHO class II pulmonary artery hypertension Cardiac catheterization today revealed the patient has pulmonary artery hypertension in the setting of left heart failure. -Cardiology following -Plan is to start GDMT slowly  #Prerenal AKI Patient's baseline creatinine is around 0.8-1.0.  Patient's creatinine up to 1.66 today.  Likely prerenal in the setting of acute heart failure exacerbation.  Patient  will continue to diurese.  We will continue to monitor kidney function. -Continue to monitor BMP -Continue diuresing  #Hypertension Patient's home medications for hypertension include lisinopril 10 mg daily.  Patient's lisinopril currently held due to acute kidney injury.  Plan is actually to discontinue lisinopril, and start Entresto after cardiac catheterization.  Patient's blood pressure currently measuring into the 120s and 130s.  Patient does have some blood pressure into the 140s.  With Lasix, this should help with blood pressure as well. -Continue to monitor blood pressure -Hold lisinopril  #Hypomagnesemia, resolved #Hypokalemia  Patient's mag 2.2. K is 3.3. Plan to replete  potassium and recheck in AM.  -Recheck  BMP in AM  #GERD Patient's abdominal pain was likely secondary to GERD.  Patient reports that Protonix has significantly helped her abdominal pain and has no abdominal pain today.  We will continue with Protonix. -Continue Protonix 40 mg daily  #Type 2 diabetes mellitus Patient is a type II diabetic.  Patient's home med includes Farxiga 10 mg.  Patient's most recent A1c was 7.8 on 01/15/2022.  Patient's glucose today has been ranging between 105-134.  Patient is currently on sliding scale insulin has only used 1 units in the past 24 hours. -Continue sliding scale insulin -Continue Farxiga 10 mg daily -Continue to monitor blood glucose  #Possible cirrhosis Patient had an abdominal CT on 01/28/2022 showing possible cirrhosis on imaging.  Patient CMP within normal limits.  Patient does have decreased albumin at 3.3.  Liver enzymes within normal limits.  PTT 17 and INR 1.4.  Patient does not have an acute decompensation at this time, but needs to be worked up further outpatient.  Patient does have outpatient ultrasound elastography liver pending. -Follow-up outpatient ultrasound elastography liver  #Iron deficiency anemia Patient has a history of iron deficiency anemia, and  takes iron supplements at home.  Patient's iron deficit was around 800 mg.  Patient started on IV Ferrlecit.  Iron studies have come back within normal limits today.  Will continue with IV Ferrlecit for today and 2 more doses, and transition to oral afterwards.  Patient denies any melena, hematochezia, or hematemesis. -Today is day 4 of 4 of IV Ferrlecit -Plan to transition to oral iron supplement -Hemoglobin stable at 11.6  #Mild leukopenia Unclear etiology currently but CBC showing decreased white blood cell count at 2.9. Reticulocyte count within normal limits.  -Unclear etiology at this moment -Consider outpatient hematology follow-up  #Hyperlipidemia -Continue atorvastatin 40 mg nightly -Continue aspirin 81 mg daily  Diet: Heart Healthy IVF: None,None VTE: Enoxaparin Code: Full  Dispo: Anticipated discharge to Home in 3 days pending clinical improvement.   [place] Resident PGY-1 [phone] Please contact the on call pager after 5 pm and on weekends at [phone].

## 2022-02-23 NOTE — TOC Progression Note (Signed)
Transition of Care [facility]) - Progression Note    Patient Details  Name: Tina Lara MRN: [id number] Date of Birth: [birth year]  [facility] CM/SW Contact  Zenon Mayo, RN Phone Number: 02/23/2022, 3:12 PM  Clinical Narrative:    rom home, for heart cath today, will medically manage, conts on IV lasix, pt eval recs HHPT.  TOC following.        Expected Discharge Plan and Services                                                 Social Determinants of Health (SDOH) Interventions    Readmission Risk Interventions     No data to display

## 2022-02-24 DIAGNOSIS — I110 Hypertensive heart disease with heart failure: Secondary | ICD-10-CM | POA: Diagnosis not present

## 2022-02-24 DIAGNOSIS — I5021 Acute systolic (congestive) heart failure: Secondary | ICD-10-CM | POA: Diagnosis not present

## 2022-02-24 DIAGNOSIS — Z87891 Personal history of nicotine dependence: Secondary | ICD-10-CM | POA: Diagnosis not present

## 2022-02-24 LAB — BASIC METABOLIC PANEL
Anion gap: 10 (ref 5–15)
BUN: 18 mg/dL (ref 8–23)
CO2: 29 mmol/L (ref 22–32)
Calcium: 9.3 mg/dL (ref 8.9–10.3)
Chloride: 103 mmol/L (ref 98–111)
Creatinine, Ser: 1.46 mg/dL — ABNORMAL HIGH (ref 0.44–1.00)
GFR, Estimated: 40 mL/min — ABNORMAL LOW (ref >60)
Glucose, Bld: 132 mg/dL — ABNORMAL HIGH (ref 70–99)
Potassium: 3.9 mmol/L (ref 3.5–5.1)
Sodium: 142 mmol/L (ref 135–145)

## 2022-02-24 LAB — CBC WITH DIFFERENTIAL/PLATELET
Abs Immature Granulocytes: 0.01 10*3/uL (ref 0.00–0.07)
Basophils Absolute: 0 10*3/uL (ref 0.0–0.1)
Basophils Relative: 1 %
Eosinophils Absolute: 0.1 10*3/uL (ref 0.0–0.5)
Eosinophils Relative: 3 %
HCT: 43.4 % (ref 36.0–46.0)
Hemoglobin: 12.3 g/dL (ref 12.0–15.0)
Immature Granulocytes: 0 %
Lymphocytes Relative: 15 %
Lymphs Abs: 0.5 10*3/uL — ABNORMAL LOW (ref 0.7–4.0)
MCH: 23.5 pg — ABNORMAL LOW (ref 26.0–34.0)
MCHC: 28.3 g/dL — ABNORMAL LOW (ref 30.0–36.0)
MCV: 83 fL (ref 80.0–100.0)
Monocytes Absolute: 0.6 10*3/uL (ref 0.1–1.0)
Monocytes Relative: 20 %
Neutro Abs: 1.9 10*3/uL (ref 1.7–7.7)
Neutrophils Relative %: 61 %
Platelets: 218 10*3/uL (ref 150–400)
RBC: 5.23 MIL/uL — ABNORMAL HIGH (ref 3.87–5.11)
RDW: 17.4 % — ABNORMAL HIGH (ref 11.5–15.5)
WBC: 3.1 10*3/uL — ABNORMAL LOW (ref 4.0–10.5)
nRBC: 0 % (ref 0.0–0.2)

## 2022-02-24 LAB — GLUCOSE, CAPILLARY
Glucose-Capillary: 124 mg/dL — ABNORMAL HIGH (ref 70–99)
Glucose-Capillary: 131 mg/dL — ABNORMAL HIGH (ref 70–99)
Glucose-Capillary: 115 mg/dL — ABNORMAL HIGH (ref 70–99)
Glucose-Capillary: 143 mg/dL — ABNORMAL HIGH (ref 70–99)

## 2022-02-24 LAB — MAGNESIUM: Magnesium: 2.2 mg/dL (ref 1.7–2.4)

## 2022-02-24 LAB — TSH: TSH: 5.47 u[IU]/mL — ABNORMAL HIGH (ref 0.350–4.500)

## 2022-02-24 LAB — T4, FREE: Free T4: 1.1 ng/dL (ref 0.61–1.12)

## 2022-02-24 MED ORDER — POTASSIUM CHLORIDE CRYS ER 20 MEQ PO TBCR: 40.0000 meq | EXTENDED_RELEASE_TABLET | Freq: Two times a day (BID) | ORAL | Status: DC

## 2022-02-24 MED ORDER — SPIRONOLACTONE 12.5 MG HALF TABLET
12.5000 mg | ORAL_TABLET | Freq: Every day | ORAL | Status: DC
  Administered 2022-02-24 – 2022-02-27 (×4): 12.5 mg via ORAL
  Filled 2022-02-24 (×4): qty 1

## 2022-02-24 MED ORDER — POTASSIUM CHLORIDE CRYS ER 20 MEQ PO TBCR: 20.0000 meq | EXTENDED_RELEASE_TABLET | Freq: Two times a day (BID) | ORAL | Status: DC

## 2022-02-24 MED ORDER — FUROSEMIDE 10 MG/ML IJ SOLN
40.0000 mg | Freq: Once | INTRAMUSCULAR | Status: AC
  Administered 2022-02-24: 40 mg via INTRAVENOUS
  Filled 2022-02-24: qty 4

## 2022-02-24 MED ORDER — POTASSIUM CHLORIDE CRYS ER 20 MEQ PO TBCR
20.0000 meq | EXTENDED_RELEASE_TABLET | Freq: Once | ORAL | Status: AC
  Administered 2022-02-24: 20 meq via ORAL
  Filled 2022-02-24: qty 1

## 2022-02-24 MED ORDER — CARVEDILOL 3.125 MG PO TABS: 3.1250 mg | ORAL_TABLET | Freq: Two times a day (BID) | ORAL | Status: DC

## 2022-02-24 NOTE — Progress Notes (Signed)
Rounding Note    Patient Name: Tina Lara Date of Encounter: 02/24/2022  [place] Cardiologist: Janina Mayo, MD   Subjective   Continues to diurese well. Net -12.7 l since admission. Weight down 20 lb. Creatinine lower than yesterday. K now normal.  Inpatient Medications    Scheduled Meds:  aspirin EC  81 mg Oral Daily   atorvastatin  40 mg Oral QHS   carvedilol  6.25 mg Oral BID WC   dapagliflozin propanediol  10 mg Oral Daily   enoxaparin (LOVENOX) injection  75 mg Subcutaneous Q24H   furosemide  40 mg Intravenous BID   insulin aspart  0-5 Units Subcutaneous QHS   insulin aspart  0-9 Units Subcutaneous TID WC   pantoprazole  40 mg Oral Daily   senna-docusate  1 tablet Oral Daily   Continuous Infusions:  PRN Meds: acetaminophen, bisacodyl, bisacodyl, ondansetron (ZOFRAN) IV   Vital Signs    Vitals:   02/23/22 2028 02/24/22 0505 02/24/22 0749 02/24/22 1145  BP: 130/62 131/78 (!) 141/77 123/67  Pulse: 92 88 78 81  Resp: '18 19 18 14  '$ Temp: 98 F (36.7 C) 97.7 F (36.5 C) (!) 97.4 F (36.3 C) 97.8 F (36.6 C)  TempSrc: Oral Oral Oral Oral  SpO2: 96% 100% 99% 97%  Weight:  (!) 147.7 kg    Height:        Intake/Output Summary (Last 24 hours) at 02/24/2022 1227 Last data filed at 02/24/2022 1146 Gross per 24 hour  Intake 1327.49 ml  Output 4000 ml  Net -2672.51 ml      02/24/2022    5:05 AM 02/23/2022    4:40 AM 02/22/2022    5:18 AM  Last 3 Weights  Weight (lbs) 325 lb 9.6 oz 328 lb 4.8 oz 337 lb 8 oz  Weight (kg) 147.691 kg 148.916 kg 153.089 kg      Telemetry    NSR - Personally Reviewed  ECG    No new tracing - Personally Reviewed  Physical Exam  Morbid obesity GEN: No acute distress.   Neck: No JVD Cardiac: RRR, no murmurs, rubs, or gallops.  Respiratory: Clear to auscultation bilaterally. GI: Soft, nontender, non-distended  MS: 3+ pitting edema bilat calves to the knees; No deformity. Neuro:  Nonfocal   Psych: Normal affect   Labs    High Sensitivity Troponin:   Recent Labs  Lab 01/28/22 0741 01/28/22 0944  TROPONINIHS 17 17     Chemistry Recent Labs  Lab 02/21/22 1743 02/22/22 0024 02/22/22 0027 02/23/22 0035 02/23/22 0858 02/23/22 0901 02/24/22 0048  NA  --   --  139 141 143  143 142 142  K  --   --  3.5 3.3* 3.4*  3.3* 3.3* 3.9  CL  --   --  100 99  --   --  103  CO2  --   --  29 29  --   --  29  GLUCOSE  --   --  124* 131*  --   --  132*  BUN  --   --  24* 22  --   --  18  CREATININE  --   --  1.53* 1.66*  --   --  1.46*  CALCIUM  --   --  8.9 9.1  --   --  9.3  MG 1.9 1.8  --  2.2  --   --  2.2  PROT 8.7*  --  7.4 8.0  --   --   --  ALBUMIN 3.6  --  3.0* 3.3*  --   --   --   AST 27  --  21 23  --   --   --   ALT 17  --  18 18  --   --   --   ALKPHOS 113  --  90 99  --   --   --   BILITOT 1.6*  --  1.1 1.0  --   --   --   GFRNONAA  --   --  38* 34*  --   --  40*  ANIONGAP  --   --  10 13  --   --  10    Lipids No results for input(s): "CHOL", "TRIG", "HDL", "LABVLDL", "LDLCALC", "CHOLHDL" in the last 168 hours.  Hematology Recent Labs  Lab 02/22/22 1420 02/23/22 0035 02/23/22 0858 02/23/22 0901 02/24/22 0048  WBC 3.0* 2.9*  --   --  3.1*  RBC 4.84 4.86  --   --  5.23*  HGB 11.5* 11.6* 12.9  12.9 12.6 12.3  HCT 40.3 40.5 38.0  38.0 37.0 43.4  MCV 83.3 83.3  --   --  83.0  MCH 23.8* 23.9*  --   --  23.5*  MCHC 28.5* 28.6*  --   --  28.3*  RDW 17.0* 16.9*  --   --  17.4*  PLT 209 187  --   --  218   Thyroid No results for input(s): "TSH", "FREET4" in the last 168 hours.  BNP Recent Labs  Lab 02/20/22 1008  BNP 1,423.2*    DDimer No results for input(s): "DDIMER" in the last 168 hours.   Radiology    CARDIAC CATHETERIZATION  Result Date: 02/23/2022 CONCLUSIONS: Left heart failure with high cardiac output 9.46 L/min with LVEDP 21 mmHg. Mild pulmonary hypertension with mean PA pressure 34 mmHg.  Pulmonary capillary wedge pressure mean 23  mmHg; pulmonary vascular resistance 1.16 Woods units.  WHO group 2 pulmonary hypertension etiology. Left dominant coronary anatomy.  No obstructive disease.  Mid LAD 40 to 50%. RECOMMENDATIONS: Exclude causes for high cardiac output such as systemic AV shunts or intrapulmonary shunting. Guideline directed therapy for systolic heart failure.    Cardiac Studies    R and L heart cath   CONCLUSIONS: Left heart failure with high cardiac output 9.46 L/min with LVEDP 21 mmHg. Mild pulmonary hypertension with mean PA pressure 34 mmHg.  Pulmonary capillary wedge pressure mean 23 mmHg; pulmonary vascular resistance 1.16 Woods units.  WHO group 2 pulmonary hypertension etiology. Left dominant coronary anatomy.  No obstructive disease.  Mid LAD 40 to 50%.   RECOMMENDATIONS:   Exclude causes for high cardiac output such as systemic AV shunts or intrapulmonary shunting. Guideline directed therapy for systolic heart failure.  Fick Cardiac Output 9.46 L/min  Fick Cardiac Output Index 3.67 (L/min)/BSA  RA A Wave 13 mmHg  RA V Wave 11 mmHg  RA Mean 10 mmHg  RV Systolic Pressure 53 mmHg  RV Diastolic Pressure 4 mmHg  RV EDP 25 mmHg  PA Systolic Pressure 49 mmHg  PA Diastolic Pressure 11 mmHg  PA Mean 34 mmHg  PW A Wave 26 mmHg  PW V Wave 35 mmHg  PW Mean 23 mmHg  AO Systolic Pressure 295 mmHg  AO Diastolic Pressure 67 mmHg  AO Mean 92 mmHg  LV Systolic Pressure 188 mmHg  LV Diastolic Pressure 7 mmHg  LV EDP 20 mmHg  AOp Systolic Pressure 416 mmHg  AOp Diastolic Pressure 61 mmHg  AOp Mean Pressure 90 mmHg  LVp Systolic Pressure 253 mmHg  LVp Diastolic Pressure 6 mmHg  LVp EDP Pressure 21 mmHg  QP/QS 1  TPVR Index 9.27 HRUI  TSVR Index 25.1 HRUI  PVR SVR Ratio 0.13  TPVR/TSVR Ratio 0.37   Echo: 02/07/22   1. Left ventricular ejection fraction, by estimation, is 25 to 30%. The  left ventricle has severely decreased function. The left ventricle  demonstrates global hypokinesis. There is  mild asymmetric left ventricular  hypertrophy of the inferior segment.  Left ventricular diastolic parameters are consistent with Grade III  diastolic dysfunction (restrictive). Elevated left ventricular  end-diastolic pressure.   2. Right ventricular systolic function is moderately reduced. The right  ventricular size is mildly enlarged. There is mildly elevated pulmonary  artery systolic pressure.   3. The mitral valve is normal in structure. No evidence of mitral valve  regurgitation. No evidence of mitral stenosis.   4. Tricuspid valve regurgitation is moderate to severe.   5. The aortic valve is normal in structure. Aortic valve regurgitation is  mild. Aortic valve sclerosis/calcification is present, without any  evidence of aortic stenosis.   6. There is mild dilatation of the ascending aorta, measuring 37 mm.   7. The inferior vena cava is normal in size with greater than 50%  respiratory variability, suggesting right atrial pressure of 3 mmHg.    Patient Profile     66 y.o. female with cirrhosis, morbid obesity, HTN, DM/HLP with newly recognized HFrEF.   Assessment & Plan    CHF:  biventricular manifestations. Still hypervolemic. Continue diuretics. On Farxiga and carvedilol. Add Entresto when we transition to PO diuretics (I think at least one more day of IV diuretics today). CMP nonischemic: high CO likely explained by cirrhosis, anemia and obesity. The latter is likely to be the dominant cause of her CMP. 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 HTN: long term prefer use of entresto, carvedilol, spironolactone, diuretics. Started carvedilol. Hold off entresto while we aggressively diurese. Add low dose spiro today. DM: fair, but not ideal control. Continue atorvastatin. Cirrhosis: RA pressure is not that high, compared to left heart pressures. This is not likely to be cardiac cirrhosis. Could be due to NASH. Aortic atherosclerosis: mild, documented on CT.  Normal caliber aorta.        For questions or updates, please contact [place] Please consult [email or web address] for contact info under        Signed, Sanda Klein, MD  02/24/2022, 12:27 PM

## 2022-02-24 NOTE — Progress Notes (Signed)
HD#3 Subjective:   Summary: This is a 66 year old female with past medical history of HFrEF (EF 25 to 30%), GERD, questionable cirrhosis, hypertension, type 2 diabetes mellitus, hyperlipidemia who presented with concerns of shortness of breath.  Patient found in acute exacerbation of HFrEF.  Patient was admitted for further treatment and management.  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  Overnight Events: No acute overnight events  Patient is resting comfortably in bed upon my exam. No chest pain, shortness of breath. States had some bleeding from stomach. No other concerns today.   Objective:  Vital signs in last 24 hours: Vitals:   02/23/22 2028 02/24/22 0505 02/24/22 0749 02/24/22 1145  BP: 130/62 131/78 (!) 141/77 123/67  Pulse: 92 88 78 81  Resp: '18 19 18 14  '$ Temp: 98 F (36.7 C) 97.7 F (36.5 C) (!) 97.4 F (36.3 C) 97.8 F (36.6 C)  TempSrc: Oral Oral Oral Oral  SpO2: 96% 100% 99% 97%  Weight:  (!) 147.7 kg    Height:       Supplemental O2: Room Air SpO2: 97 %   Physical Exam:  Constitutional: Patient sitting in bed in no acute distress HENT: normocephalic atraumatic, mucous membranes moist Eyes: conjunctiva non-erythematous Neck: JVD noted up to earlobe Cardiovascular: regular rate and rhythm, no m/r/g Pulmonary/Chest: normal work of breathing on room air, lungs clear to auscultation bilaterally Abdominal: soft, non-tender, non-distended MSK: normal bulk and tone Neurological: alert & oriented x 3 Skin: warm and dry Extremities: 2+ pitting edema to bilateral lower extremities  Filed Weights   02/22/22 0518 02/23/22 0440 02/24/22 0505  Weight: (!) 153.1 kg (!) 148.9 kg (!) 147.7 kg     Intake/Output Summary (Last 24 hours) at 02/24/2022 1237 Last data filed at 02/24/2022 1146 Gross per 24 hour  Intake 1327.49 ml  Output 4000 ml  Net -2672.51 ml   Net IO Since Admission: -12,716.57 mL [02/24/22  1237]  Pertinent Labs:    Latest Ref Rng & Units 02/24/2022   12:48 AM 02/23/2022    9:01 AM 02/23/2022    8:58 AM  CBC  WBC 4.0 - 10.5 K/uL 3.1     Hemoglobin 12.0 - 15.0 g/dL 12.3  12.6  12.9    12.9   Hematocrit 36.0 - 46.0 % 43.4  37.0  38.0    38.0   Platelets 150 - 400 K/uL 218          Latest Ref Rng & Units 02/24/2022   12:48 AM 02/23/2022    9:01 AM 02/23/2022    8:58 AM  CMP  Glucose 70 - 99 mg/dL 132     BUN 8 - 23 mg/dL 18     Creatinine 0.44 - 1.00 mg/dL 1.46     Sodium 135 - 145 mmol/L 142  142  143    143   Potassium 3.5 - 5.1 mmol/L 3.9  3.3  3.3    3.4   Chloride 98 - 111 mmol/L 103     CO2 22 - 32 mmol/L 29     Calcium 8.9 - 10.3 mg/dL 9.3       Imaging: No results found.  Assessment/Plan:   Principal Problem:   Acute HFrEF (heart failure with reduced ejection fraction) (HCC) Active Problems:   HTN (hypertension)   Diabetes mellitus without complication (HCC)   HLD (hyperlipidemia)   GERD (gastroesophageal reflux disease)   Cirrhosis ([place])   Patient Summary:  Tina Lara is a 66 y.o. female with past medical history of HFrEF (EF 25 to 30%), GERD, questionable cirrhosis, hypertension, type 2 diabetes mellitus, hyperlipidemia who presented with concerns of shortness of breath.  Patient found in acute exacerbation of HFrEF.  Patient was admitted for further treatment and management.  #High-output cardiac failure Patient had cardiac cath today showing high output cardiac failure.  Patient also had WHO class II pulmonary artery hypertension.  Patient's diuresis was slowed down yesterday, given patient was developing lightheadedness.  Patient has had output of 0.8L yesterday after getting 40 mg IV Lasix twice daily.  Patient notes that she has been walking around the room with no concerns.  She denies any shortness of breath.  On exam, patient does have JVD and 2+ pitting edema. Plan to slowly introduce GDMT.  Farxiga 10 mg daily is already on.   Start carvedilol 6.25 mg twice daily today.  Once diuresing is done and AKI is resolved, plan to start Entresto and spironolactone.  Will try to workup etiology behind high-output cardiac failure. -Cardiology following, appreciate recommendations -Continue diuresing with Lasix 40 mg BID, increase if output is not adequate -Strict I's and O's -Daily weights -Conitnue Farxiga 10 mg daily -Continue carvedilol 6.25 mg twice daily today -Start other GDMT as kidney function allows  -Goal Mag >2, and goal K>4.0 -TSH pending  #WHO class II pulmonary artery hypertension Cardiac catheterization today revealed the patient has pulmonary artery hypertension in the setting of left heart failure. -Cardiology following -Plan is to start GDMT slowly -Currently on Farxiga 10 mg daily  -Continue carvedilol 6.25 mg BID today  #Prerenal AKI, improving  Patient's baseline creatinine is around 0.8-1.0.  Patient's creatinine decreased to 1.46 from 1.66 today.  Likely prerenal in the setting of acute heart failure exacerbation.  Patient will continue to diurese.  We will continue to monitor kidney function. -Continue to monitor BMP -Continue diuresing  #Hypertension Patient's blood pressure currently measuring into the 120s and 130s.  Patient does have some blood pressure into the 140s. Carvedilol started today.  Will continue to monitor blood pressure -Continue to monitor blood pressure -Hold lisinopril, will plan to start entresto and spironolactone as the patient's kidney function comes back to baseline   #Hypomagnesemia, resolved #Hypokalemia, resolved  Patient's mag 2.2. K is 3.9.  -Recheck BMP and mag in AM given patient is continuously diuresing  #GERD -Continue Protonix 40 mg daily  #Type 2 diabetes mellitus Patient is a type II diabetic.  Patient's home med includes Farxiga 10 mg.  Patient's most recent A1c was 7.8 on 01/15/2022.  Patient's glucose today has been ranging between 105-140.   Patient is currently on sliding scale insulin has only used 1 units in the past 24 hours. -Continue sliding scale insulin -Continue Farxiga 10 mg daily -Continue to monitor blood glucose  #Possible cirrhosis Patient had an abdominal CT on 01/28/2022 showing possible cirrhosis on imaging.  Patient CMP within normal limits.  Patient does have decreased albumin at 3.3.  Liver enzymes within normal limits.  PTT 17 and INR 1.4.  Patient does not have an acute decompensation at this time, but needs to be worked up further outpatient.  Patient does have outpatient ultrasound elastography liver pending.  Given diagnosis of high-output cardiac failure, cirrhosis could be etiology. -Follow-up outpatient ultrasound elastography liver  #Iron deficiency anemia Patient has a history of iron deficiency anemia, and takes iron supplements at home.  Patient's iron deficit was around 800 mg.  Patient  started on IV Ferrlecit.  Iron studies have come back within normal limits today.  Will continue with IV Ferrlecit for today and transition to oral afterwards.  Patient denies any melena, hematochezia, or hematemesis. -Today is day 4 of 4 of IV Ferrlecit -Plan to transition to oral iron supplement starting tomorrow  -Hemoglobin stable at 12.3  #Mild leukopenia Unclear etiology currently but CBC showing decreased white blood cell count at 3.1. Reticulocyte count within normal limits.  -Unclear etiology at this moment -Consider outpatient hematology follow-up  #Hyperlipidemia -Continue atorvastatin 40 mg nightly -Continue aspirin 81 mg daily  Diet: Heart Healthy IVF: None,None VTE: Enoxaparin Code: Full  Dispo: Anticipated discharge to Home in 1 days pending clinical improvement.   [facility] Resident PGY-1 [phone] Please contact the on call pager after 5 pm and on weekends at [phone].

## 2022-02-24 NOTE — Progress Notes (Signed)
Mobility Specialist Progress Note    02/24/22 1206  Mobility  Activity Ambulated with assistance in hallway  Level of Assistance Standby assist, set-up cues, supervision of patient - no hands on  Assistive Device Four wheel walker  Distance Ambulated (ft) 250 ft  Activity Response Tolerated well  Mobility Referral Yes  $Mobility charge 1 Mobility   Pre-Mobility: 81 HR Post-Mobility: 109 HR  Pt received sitting EOB and agreeable. No complaints on walk. Returned to sitting EOB with call bell in reach.    Hildred Alamin Mobility Specialist  Please Psychologist, sport and exercise or Rehab Office at [phone]

## 2022-02-25 DIAGNOSIS — Z87891 Personal history of nicotine dependence: Secondary | ICD-10-CM | POA: Diagnosis not present

## 2022-02-25 DIAGNOSIS — I110 Hypertensive heart disease with heart failure: Secondary | ICD-10-CM | POA: Diagnosis not present

## 2022-02-25 DIAGNOSIS — I5021 Acute systolic (congestive) heart failure: Secondary | ICD-10-CM | POA: Diagnosis not present

## 2022-02-25 LAB — GLUCOSE, CAPILLARY
Glucose-Capillary: 115 mg/dL — ABNORMAL HIGH (ref 70–99)
Glucose-Capillary: 142 mg/dL — ABNORMAL HIGH (ref 70–99)
Glucose-Capillary: 156 mg/dL — ABNORMAL HIGH (ref 70–99)
Glucose-Capillary: 141 mg/dL — ABNORMAL HIGH (ref 70–99)

## 2022-02-25 LAB — CBC
HCT: 43.2 % (ref 36.0–46.0)
Hemoglobin: 12.4 g/dL (ref 12.0–15.0)
MCH: 23.6 pg — ABNORMAL LOW (ref 26.0–34.0)
MCHC: 28.7 g/dL — ABNORMAL LOW (ref 30.0–36.0)
MCV: 82.3 fL (ref 80.0–100.0)
Platelets: 211 10*3/uL (ref 150–400)
RBC: 5.25 MIL/uL — ABNORMAL HIGH (ref 3.87–5.11)
RDW: 17 % — ABNORMAL HIGH (ref 11.5–15.5)
WBC: 2.9 10*3/uL — ABNORMAL LOW (ref 4.0–10.5)
nRBC: 0 % (ref 0.0–0.2)

## 2022-02-25 LAB — BASIC METABOLIC PANEL
Anion gap: 12 (ref 5–15)
BUN: 17 mg/dL (ref 8–23)
CO2: 27 mmol/L (ref 22–32)
Calcium: 9.3 mg/dL (ref 8.9–10.3)
Chloride: 101 mmol/L (ref 98–111)
Creatinine, Ser: 1.4 mg/dL — ABNORMAL HIGH (ref 0.44–1.00)
GFR, Estimated: 42 mL/min — ABNORMAL LOW (ref >60)
Glucose, Bld: 154 mg/dL — ABNORMAL HIGH (ref 70–99)
Potassium: 3.8 mmol/L (ref 3.5–5.1)
Sodium: 140 mmol/L (ref 135–145)

## 2022-02-25 LAB — MAGNESIUM: Magnesium: 2 mg/dL (ref 1.7–2.4)

## 2022-02-25 MED ORDER — FERROUS SULFATE 325 (65 FE) MG PO TABS
325.0000 mg | ORAL_TABLET | ORAL | Status: DC
  Administered 2022-02-25 – 2022-02-27 (×2): 325 mg via ORAL
  Filled 2022-02-25 (×2): qty 1

## 2022-02-25 MED ORDER — POTASSIUM CHLORIDE CRYS ER 20 MEQ PO TBCR
40.0000 meq | EXTENDED_RELEASE_TABLET | Freq: Once | ORAL | Status: AC
  Administered 2022-02-25: 40 meq via ORAL
  Filled 2022-02-25: qty 2

## 2022-02-25 MED ORDER — BISACODYL 5 MG PO TBEC
5.0000 mg | DELAYED_RELEASE_TABLET | Freq: Every day | ORAL | Status: DC
  Administered 2022-02-26 – 2022-02-27 (×2): 5 mg via ORAL
  Filled 2022-02-25 (×2): qty 1

## 2022-02-25 MED ORDER — FUROSEMIDE 10 MG/ML IJ SOLN
40.0000 mg | Freq: Two times a day (BID) | INTRAMUSCULAR | Status: AC
  Administered 2022-02-25 (×2): 40 mg via INTRAVENOUS
  Filled 2022-02-25 (×2): qty 4

## 2022-02-25 NOTE — Progress Notes (Signed)
HD#4 Subjective:   Summary: This is a 66 year old female with past medical history of HFrEF (EF 25 to 30%), GERD, questionable cirrhosis, hypertension, type 2 diabetes mellitus, hyperlipidemia who presented with concerns of shortness of breath.  Patient found in acute exacerbation of HFrEF.  Patient was admitted for further treatment and management.  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  Overnight Events: We were notified, patient had 8 beats of NSVT overnight when patient was getting up to get weighed.  Patient had no symptoms at the time.  Self resolved.  Patient is resting in bed comfortably upon my exam.  Patient states that she is doing well.  She denies any chest pain or shortness of breath.  No acute concerns at this time.  Plan dicussed in detail.   Objective:  Vital signs in last 24 hours: Vitals:   02/24/22 1145 02/24/22 1711 02/24/22 1945 02/25/22 0438  BP: 123/67 (!) 122/50 103/68 133/61  Pulse: 81  72 91  Resp: '14  16 16  '$ Temp: 97.8 F (36.6 C)  98 F (36.7 C) 97.8 F (36.6 C)  TempSrc: Oral  Oral Oral  SpO2: 97%  99%   Weight:    (!) 145.6 kg  Height:       Supplemental O2: Room Air SpO2: 99 %   Physical Exam:   Constitutional: Patient sitting in bed in no acute distress HENT: normocephalic atraumatic, mucous membranes moist Eyes: conjunctiva non-erythematous Neck: JVD noted up to earlobe Cardiovascular: regular rate and rhythm, no m/r/g Pulmonary/Chest: normal work of breathing on room air, lungs clear to auscultation bilaterally Abdominal: soft, non-tender, non-distended MSK: normal bulk and tone Neurological: alert & oriented x 3 Skin: warm and dry Extremities: 2+ pitting edema to bilateral lower extremities  Filed Weights   02/23/22 0440 02/24/22 0505 02/25/22 0438  Weight: (!) 148.9 kg (!) 147.7 kg (!) 145.6 kg     Intake/Output Summary (Last 24 hours) at 02/25/2022 0613 Last data filed at  02/25/2022 0300 Gross per 24 hour  Intake 720 ml  Output 4000 ml  Net -3280 ml    Net IO Since Admission: -14,236.57 mL [02/25/22 0613]  Pertinent Labs:    Latest Ref Rng & Units 02/25/2022   12:50 AM 02/24/2022   12:48 AM 02/23/2022    9:01 AM  CBC  WBC 4.0 - 10.5 K/uL 2.9  3.1    Hemoglobin 12.0 - 15.0 g/dL 12.4  12.3  12.6   Hematocrit 36.0 - 46.0 % 43.2  43.4  37.0   Platelets 150 - 400 K/uL 211  218         Latest Ref Rng & Units 02/25/2022   12:50 AM 02/24/2022   12:48 AM 02/23/2022    9:01 AM  CMP  Glucose 70 - 99 mg/dL 154  132    BUN 8 - 23 mg/dL 17  18    Creatinine 0.44 - 1.00 mg/dL 1.40  1.46    Sodium 135 - 145 mmol/L 140  142  142   Potassium 3.5 - 5.1 mmol/L 3.8  3.9  3.3   Chloride 98 - 111 mmol/L 101  103    CO2 22 - 32 mmol/L 27  29    Calcium 8.9 - 10.3 mg/dL 9.3  9.3      Imaging: No results found.  Assessment/Plan:   Principal Problem:   Acute HFrEF (heart failure with reduced ejection fraction) (HCC) Active Problems:  HTN (hypertension)   Diabetes mellitus without complication ([place])   HLD (hyperlipidemia)   GERD (gastroesophageal reflux disease)   Cirrhosis ([place])   Patient Summary: Tina Lara is a 66 y.o. female with past medical history of HFrEF (EF 25 to 30%), GERD, questionable cirrhosis, hypertension, type 2 diabetes mellitus, hyperlipidemia who presented with concerns of shortness of breath.  Patient found in acute exacerbation of HFrEF.  Patient was admitted for further treatment and management.  #High-output cardiac failure Patient has output of 4 L after 80 mg IV Lasix yesterday.  Patient's respiratory status is improving.  Patient still with volume overload on exam, with JVD and 2+ pitting edema.  Etiology of high-output cardiac failure likely secondary to cirrhosis, anemia, or obesity thyroid etiology was ruled out.  Continue with slowly introducing GDMT.  -Cardiology following, appreciate recommendations -Continue  diuresing with Lasix 40 mg IV BID -Strict I's and O's -Daily weights -Conitnue Farxiga 10 mg daily -Continue carvedilol 6.25 mg twice daily today -Continue spironolactone 12.5 mg daily -Goal Mag >2, and goal K>4.0 -Plan to start Entresto once IV diuresis he has completed, likely will need 1 more day of IV diuresis  #WHO class II pulmonary artery hypertension Cardiac catheterization revealed the patient has pulmonary artery hypertension in the setting of left heart failure. -Cardiology following, appreciate recs -Currently on Farxiga 10 mg daily  -Continue carvedilol 6.25 mg BID today -Continue spironolactone 12.5 mg daily -Add Entresto when patient is complete with IV diuresis  #Prerenal AKI, improving  Patient's baseline creatinine is around 0.8-1.0.  Patient's creatinine decreased to 1.40 from 1.46 today.  Will continue to diurese patient. -Continue to monitor BMP -Continue diuresing  #Hypertension Patient's blood pressure currently measuring into the between 110s and 130s.  Carvedilol and spironolactone started yesterday. Will continue to monitor blood pressure -Continue to monitor blood pressure -Continue carvedilol 6.25 mg twice daily -Continue spironolactone 12.5 mg daily -Will add Entresto once patient is transitioned to p.o. Lasix  #Hypomagnesemia, resolved #Hypokalemia, resolved  Patient's mag 2.0. K is 3.8.  -Recheck BMP and mag in AM given patient is continuously diuresing  #GERD -Continue Protonix 40 mg daily  #Type 2 diabetes mellitus Patient is a type II diabetic.  Patient's home med includes Farxiga 10 mg.  Patient's most recent A1c was 7.8 on 01/15/2022.  Patient's glucose today has been ranging between 115-154.  Patient is currently on sliding scale insulin has only used 1 unit in the past 24 hours. -Continue sliding scale insulin -Continue Farxiga 10 mg daily -Continue to monitor blood glucose  #Possible cirrhosis Patient had an abdominal CT on 01/28/2022  showing possible cirrhosis on imaging.  Patient CMP within normal limits.  Patient does have decreased albumin at 3.3.  Liver enzymes within normal limits.  PTT 17 and INR 1.4.  Patient does not have an acute decompensation at this time, but needs to be worked up further outpatient.  Patient does have outpatient ultrasound elastography liver pending.  Given diagnosis of high-output cardiac failure, cirrhosis could be etiology. -Follow-up outpatient ultrasound elastography liver  #Iron deficiency anemia Patient has a history of iron deficiency anemia, and takes iron supplements at home.  Patient's iron deficit was around 800 mg.  Patient completed IV Ferrlecit.  Plan to transition to oral supplementation. -Plan to transition to oral iron supplement starting today -CBC showing hemoglobin 12.4 -Continue ferrous sulfate 325 mg every other day  #Mild leukopenia Unclear etiology currently but CBC showing decreased white blood cell count at 2.9. Reticulocyte  count within normal limits.  -Unclear etiology at this moment -Consider outpatient hematology follow-up  #Hyperlipidemia -Continue atorvastatin 40 mg nightly -Continue aspirin 81 mg daily  Diet: Heart Healthy IVF: None,None VTE: Enoxaparin Code: Full  Dispo: Anticipated discharge to Home in 1 days pending clinical improvement.   [facility] Resident PGY-1 [phone] Please contact the on call pager after 5 pm and on weekends at [phone].

## 2022-02-25 NOTE — Progress Notes (Signed)
Rounding Note    Patient Name: Tina Lara Date of Encounter: 02/25/2022  [place] Cardiologist: Janina Mayo, MD   Subjective   Net diuresis 14 L since admission.  Weight down another 4 pounds since yesterday, 24 pounds since admission. Renal parameters show slight improving trend with diuresis.  Electrolytes okay. So far tolerating heart failure medications without hypotension, but blood pressure has been as low as 101/50.  Inpatient Medications    Scheduled Meds:  aspirin EC  81 mg Oral Daily   atorvastatin  40 mg Oral QHS   carvedilol  6.25 mg Oral BID WC   dapagliflozin propanediol  10 mg Oral Daily   enoxaparin (LOVENOX) injection  75 mg Subcutaneous Q24H   ferrous sulfate  325 mg Oral QODAY   furosemide  40 mg Intravenous BID   insulin aspart  0-5 Units Subcutaneous QHS   insulin aspart  0-9 Units Subcutaneous TID WC   pantoprazole  40 mg Oral Daily   senna-docusate  1 tablet Oral Daily   spironolactone  12.5 mg Oral Daily   Continuous Infusions:  PRN Meds: acetaminophen, bisacodyl, bisacodyl, ondansetron (ZOFRAN) IV   Vital Signs    Vitals:   02/24/22 1945 02/25/22 0438 02/25/22 0500 02/25/22 0820  BP: 103/68 133/61  101/69  Pulse: 72 91  80  Resp: '16 16  20  '$ Temp: 98 F (36.7 C) 97.8 F (36.6 C)  97.6 F (36.4 C)  TempSrc: Oral Oral  Oral  SpO2: 99%  99% 97%  Weight:  (!) 145.6 kg    Height:        Intake/Output Summary (Last 24 hours) at 02/25/2022 1053 Last data filed at 02/25/2022 0950 Gross per 24 hour  Intake 840 ml  Output 4000 ml  Net -3160 ml      02/25/2022    4:38 AM 02/24/2022    5:05 AM 02/23/2022    4:40 AM  Last 3 Weights  Weight (lbs) 321 lb 325 lb 9.6 oz 328 lb 4.8 oz  Weight (kg) 145.605 kg 147.691 kg 148.916 kg      Telemetry    Sinus rhythm, single 12 beat run of nonsustained VT- Personally Reviewed  ECG    No new tracing- Personally Reviewed  Physical Exam  Morbidly obese GEN: No  acute distress.   Neck: Difficult to see JVD Cardiac: RRR, no murmurs, rubs, or gallops.  Respiratory: Clear to auscultation bilaterally. GI: Soft, nontender, non-distended  MS: Pitting 2+ edema almost to the knees bilaterally; No deformity. Neuro:  Nonfocal  Psych: Normal affect   Labs    High Sensitivity Troponin:   Recent Labs  Lab 01/28/22 0741 01/28/22 0944  TROPONINIHS 17 17     Chemistry Recent Labs  Lab 02/21/22 1743 02/22/22 0024 02/22/22 0027 02/23/22 0035 02/23/22 0858 02/23/22 0901 02/24/22 0048 02/25/22 0050  NA  --   --  139 141   < > 142 142 140  K  --   --  3.5 3.3*   < > 3.3* 3.9 3.8  CL  --   --  100 99  --   --  103 101  CO2  --   --  29 29  --   --  29 27  GLUCOSE  --   --  124* 131*  --   --  132* 154*  BUN  --   --  24* 22  --   --  18 17  CREATININE  --   --  1.53* 1.66*  --   --  1.46* 1.40*  CALCIUM  --   --  8.9 9.1  --   --  9.3 9.3  MG 1.9   < >  --  2.2  --   --  2.2 2.0  PROT 8.7*  --  7.4 8.0  --   --   --   --   ALBUMIN 3.6  --  3.0* 3.3*  --   --   --   --   AST 27  --  21 23  --   --   --   --   ALT 17  --  18 18  --   --   --   --   ALKPHOS 113  --  90 99  --   --   --   --   BILITOT 1.6*  --  1.1 1.0  --   --   --   --   GFRNONAA  --   --  38* 34*  --   --  40* 42*  ANIONGAP  --   --  10 13  --   --  10 12   < > = values in this interval not displayed.    Lipids No results for input(s): "CHOL", "TRIG", "HDL", "LABVLDL", "LDLCALC", "CHOLHDL" in the last 168 hours.  Hematology Recent Labs  Lab 02/23/22 0035 02/23/22 0858 02/23/22 0901 02/24/22 0048 02/25/22 0050  WBC 2.9*  --   --  3.1* 2.9*  RBC 4.86  --   --  5.23* 5.25*  HGB 11.6*   < > 12.6 12.3 12.4  HCT 40.5   < > 37.0 43.4 43.2  MCV 83.3  --   --  83.0 82.3  MCH 23.9*  --   --  23.5* 23.6*  MCHC 28.6*  --   --  28.3* 28.7*  RDW 16.9*  --   --  17.4* 17.0*  PLT 187  --   --  218 211   < > = values in this interval not displayed.   Thyroid  Recent Labs  Lab  02/24/22 0122 02/24/22 1538  TSH 5.470*  --   FREET4  --  1.10    BNP Recent Labs  Lab 02/20/22 1008  BNP 1,423.2*    DDimer No results for input(s): "DDIMER" in the last 168 hours.   Radiology    No results found.  Cardiac Studies    R and L heart cath   CONCLUSIONS: Left heart failure with high cardiac output 9.46 L/min with LVEDP 21 mmHg. Mild pulmonary hypertension with mean PA pressure 34 mmHg.  Pulmonary capillary wedge pressure mean 23 mmHg; pulmonary vascular resistance 1.16 [place] units.  WHO group 2 pulmonary hypertension etiology. Left dominant coronary anatomy.  No obstructive disease.  Mid LAD 40 to 50%.   RECOMMENDATIONS:   Exclude causes for high cardiac output such as systemic AV shunts or intrapulmonary shunting. Guideline directed therapy for systolic heart failure.   Fick Cardiac Output 9.46 L/min  Fick Cardiac Output Index 3.67 (L/min)/BSA  RA A Wave 13 mmHg  RA V Wave 11 mmHg  RA Mean 10 mmHg  RV Systolic Pressure 53 mmHg  RV Diastolic Pressure 4 mmHg  RV EDP 25 mmHg  PA Systolic Pressure 49 mmHg  PA Diastolic Pressure 11 mmHg  PA Mean 34 mmHg  PW A Wave 26 mmHg  PW V Wave 35 mmHg  PW Mean 23 mmHg  AO Systolic  Pressure 427 mmHg  AO Diastolic Pressure 67 mmHg  AO Mean 92 mmHg  LV Systolic Pressure 062 mmHg  LV Diastolic Pressure 7 mmHg  LV EDP 20 mmHg  AOp Systolic Pressure 376 mmHg  AOp Diastolic Pressure 61 mmHg  AOp Mean Pressure 90 mmHg  LVp Systolic Pressure 283 mmHg  LVp Diastolic Pressure 6 mmHg  LVp EDP Pressure 21 mmHg  QP/QS 1  TPVR Index 9.27 HRUI  TSVR Index 25.1 HRUI  PVR SVR Ratio 0.13  TPVR/TSVR Ratio 0.37   Echo: 02/07/22   1. Left ventricular ejection fraction, by estimation, is 25 to 30%. The  left ventricle has severely decreased function. The left ventricle  demonstrates global hypokinesis. There is mild asymmetric left ventricular  hypertrophy of the inferior segment.  Left ventricular diastolic parameters  are consistent with Grade III  diastolic dysfunction (restrictive). Elevated left ventricular  end-diastolic pressure.   2. Right ventricular systolic function is moderately reduced. The right  ventricular size is mildly enlarged. There is mildly elevated pulmonary  artery systolic pressure.   3. The mitral valve is normal in structure. No evidence of mitral valve  regurgitation. No evidence of mitral stenosis.   4. Tricuspid valve regurgitation is moderate to severe.   5. The aortic valve is normal in structure. Aortic valve regurgitation is  mild. Aortic valve sclerosis/calcification is present, without any  evidence of aortic stenosis.   6. There is mild dilatation of the ascending aorta, measuring 37 mm.   7. The inferior vena cava is normal in size with greater than 50%  respiratory variability, suggesting right atrial pressure of 3 mmHg.    Patient Profile     66 y.o. 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    Assessment & Plan    CHF:  biventricular manifestations. Still hypervolemic. Continue diuretics. On Farxiga and carvedilol and spironolactone. Add Entresto when we transition to PO diuretics, if BP allows. CMP nonischemic: high CO likely explained by cirrhosis, anemia and obesity. The latter is likely to be the dominant cause of her CMP. 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  Reevaluate by transthoracic echo once we have reached euvolemia. HTN: long term prefer use of entresto (or at least angiotensin receptor blocker), carvedilol, spironolactone, diuretics.  BP now borderline low.  Might not tolerate Entresto. DM: fair, but not ideal control. Continue atorvastatin. Cirrhosis: RA pressure is not that high, compared to left heart pressures. This is not likely to be cardiac cirrhosis. Could be due to NASH. Nonobstructive CAD/aortic  atherosclerosis: mild, documented on coronary angiography and chest CT. Normal caliber aorta.   Dyslipidemia: On statin, recent LDL cholesterol 72, close to target less than 70.  Note low HDL at 33, unlikely to improve without substantial weight loss.     For questions or updates, please contact [facility] Please consult [email or web address] for contact info under        Signed, Sanda Klein, MD  02/25/2022, 10:53 AM

## 2022-02-25 NOTE — Plan of Care (Signed)
  Problem: Clinical Measurements: Goal: Will remain free from infection Outcome: Completed/Met   Problem: Activity: Goal: Risk for activity intolerance will decrease Outcome: Completed/Met   Problem: Nutrition: Goal: Adequate nutrition will be maintained Outcome: Completed/Met   Problem: Elimination: Goal: Will not experience complications related to bowel motility Outcome: Completed/Met Goal: Will not experience complications related to urinary retention Outcome: Completed/Met   Problem: Pain Managment: Goal: General experience of comfort will improve Outcome: Completed/Met   Problem: Safety: Goal: Ability to remain free from injury will improve Outcome: Completed/Met   Problem: Skin Integrity: Goal: Risk for impaired skin integrity will decrease Outcome: Completed/Met   

## 2022-02-26 ENCOUNTER — Other Ambulatory Visit (HOSPITAL_COMMUNITY): Payer: Self-pay

## 2022-02-26 ENCOUNTER — Encounter (HOSPITAL_COMMUNITY): Payer: Self-pay | Admitting: Student in an Organized Health Care Education/Training Program

## 2022-02-26 LAB — BASIC METABOLIC PANEL
Anion gap: 9 (ref 5–15)
BUN: 19 mg/dL (ref 8–23)
CO2: 28 mmol/L (ref 22–32)
Calcium: 8.9 mg/dL (ref 8.9–10.3)
Chloride: 100 mmol/L (ref 98–111)
Creatinine, Ser: 1.46 mg/dL — ABNORMAL HIGH (ref 0.44–1.00)
GFR, Estimated: 40 mL/min — ABNORMAL LOW (ref >60)
Glucose, Bld: 132 mg/dL — ABNORMAL HIGH (ref 70–99)
Potassium: 3.7 mmol/L (ref 3.5–5.1)
Sodium: 137 mmol/L (ref 135–145)

## 2022-02-26 LAB — GLUCOSE, CAPILLARY
Glucose-Capillary: 152 mg/dL — ABNORMAL HIGH (ref 70–99)
Glucose-Capillary: 161 mg/dL — ABNORMAL HIGH (ref 70–99)
Glucose-Capillary: 128 mg/dL — ABNORMAL HIGH (ref 70–99)
Glucose-Capillary: 138 mg/dL — ABNORMAL HIGH (ref 70–99)

## 2022-02-26 LAB — MAGNESIUM: Magnesium: 2 mg/dL (ref 1.7–2.4)

## 2022-02-26 MED ORDER — POTASSIUM CHLORIDE CRYS ER 20 MEQ PO TBCR
40.0000 meq | EXTENDED_RELEASE_TABLET | Freq: Once | ORAL | Status: AC
  Administered 2022-02-26: 40 meq via ORAL
  Filled 2022-02-26: qty 2

## 2022-02-26 MED ORDER — FUROSEMIDE 10 MG/ML IJ SOLN
40.0000 mg | Freq: Once | INTRAMUSCULAR | Status: AC
  Administered 2022-02-26: 40 mg via INTRAVENOUS
  Filled 2022-02-26: qty 4

## 2022-02-26 MED ORDER — FUROSEMIDE 10 MG/ML IJ SOLN
40.0000 mg | Freq: Two times a day (BID) | INTRAMUSCULAR | Status: DC
  Administered 2022-02-26: 40 mg via INTRAVENOUS
  Filled 2022-02-26: qty 4

## 2022-02-26 MED ORDER — FUROSEMIDE 10 MG/ML IJ SOLN
20.0000 mg | Freq: Once | INTRAMUSCULAR | Status: AC
  Administered 2022-02-26: 20 mg via INTRAVENOUS
  Filled 2022-02-26: qty 2

## 2022-02-26 MED ORDER — FUROSEMIDE 10 MG/ML IJ SOLN: 40.0000 mg | Freq: Two times a day (BID) | INTRAMUSCULAR | Status: DC

## 2022-02-26 MED ORDER — FUROSEMIDE 10 MG/ML IJ SOLN: 20.0000 mg | Freq: Once | INTRAMUSCULAR | Status: DC

## 2022-02-26 MED ORDER — FUROSEMIDE 10 MG/ML IJ SOLN: 40.0000 mg | Freq: Once | INTRAMUSCULAR | Status: DC

## 2022-02-26 MED ORDER — SACUBITRIL-VALSARTAN 24-26 MG PO TABS
1.0000 | ORAL_TABLET | Freq: Two times a day (BID) | ORAL | Status: DC
  Administered 2022-02-26 – 2022-02-27 (×3): 1 via ORAL
  Filled 2022-02-26 (×3): qty 1

## 2022-02-26 NOTE — Progress Notes (Signed)
Heart Failure Nurse Navigator Progress Note  PCP: Velna Ochs, MD PCP-Cardiologist: Suares Admission Diagnosis: None Admitted from: PCP to ED  Presentation:   Tina Lara presented with JVD, shortness of breath, Bilateral lower extremity edema, recent admission to hospital after a trip to [place] with signs of CHF on 01/28/22. Was given lasix and recommended a increase of dose. On admission BNP was 1423, was down 15 pounds after initial diuresing . A R/LHC was done on 11/24 showed high cardiac output and mild pulmonary hypertension without obstructive CAD.   Patient was educated on the sign and symptoms of heart failure, daily weights, when to call her doctor or go to the ED, Diet/ fluid restrictions ( reported that patient does eat lunch meat regularly as well as bacon.) Education on taking all her medications as prescribed and attending all medical appointments. Patient voiced her understanding, a scheduled hospital appointment has been made for 03/09/22 @ 9 am.    ECHO/ LVEF: 25-30% HFrEF G2DD  Clinical Course:  Past Medical History:  Diagnosis Date   Anxiety    Cataract    NS OD   Chest pain 02/72/5366   Complication of anesthesia    Per patient "needs a lot of anesthesia w/surgery" ,I had toruble with a spinal block not taking "   Diabetes mellitus without complication (HCC)    Type 2   Diabetic retinopathy (HCC)    BDR   Full dentures    GERD (gastroesophageal reflux disease)    Heart failure ([place])    History of surgery on arm    right arm broken   Hyperlipidemia    Hypertension    never diagnosed by physician per pt   Macular degeneration    Obesity      Social History   Socioeconomic History   Marital status: Divorced    Spouse name: Not on file   Number of children: 1   Years of education: Not on file   Highest education level: High school graduate  Occupational History   Occupation: disability  Tobacco Use   Smoking status: Former    Packs/day: 0.25     Years: 6.00    Total pack years: 1.50    Types: Cigarettes   Smokeless tobacco: Never   Tobacco comments:    quit 36 yrs ago  Vaping Use   Vaping Use: Never used  Substance and Sexual Activity   Alcohol use: Yes    Comment: occasionally   Drug use: No   Sexual activity: Not Currently    Birth control/protection: None  Other Topics Concern   Not on file  Social History Narrative   Not on file   Social Determinants of Health   Financial Resource Strain: High Risk (02/26/2022)   Overall Financial Resource Strain (CARDIA)    Difficulty of Paying Living Expenses: Very hard  Food Insecurity: No Food Insecurity (02/21/2022)   Hunger Vital Sign    Worried About Running Out of Food in the Last Year: Never true    Ran Out of Food in the Last Year: Never true  Recent Concern: Food Insecurity - Food Insecurity Present (02/14/2022)   Hunger Vital Sign    Worried About Running Out of Food in the Last Year: Sometimes true    Ran Out of Food in the Last Year: Sometimes true  Transportation Needs: No Transportation Needs (02/26/2022)   PRAPARE - Hydrologist (Medical): No    Lack of Transportation (Non-Medical): No  Physical Activity: Inactive (02/14/2022)   Exercise Vital Sign    Days of Exercise per Week: 0 days    Minutes of Exercise per Session: 0 min  Stress: No Stress Concern Present (02/14/2022)   [place]    Feeling of Stress : Not at all  Social Connections: Moderately Isolated (02/14/2022)   Social Connection and Isolation Panel [NHANES]    Frequency of Communication with Friends and Family: Twice a week    Frequency of Social Gatherings with Friends and Family: Once a week    Attends Religious Services: More than 4 times per year    Active Member of [employer] or Organizations: No    Attends Music therapist: Never    Marital Status: Divorced   Teacher, early years/pre  and Provision:  Detailed education and instructions provided on heart failure disease management including the following:  Signs and symptoms of Heart Failure When to call the physician Importance of daily weights Low sodium diet Fluid restriction Medication management Anticipated future follow-up appointments  Patient education given on each of the above topics.  Patient acknowledges understanding via teach back method and acceptance of all instructions.  Education Materials:  "Living Better With Heart Failure" Booklet, HF zone tool, & Daily Weight Tracker Tool.  Patient has scale at home: yes Patient has pill box at home: NA    High Risk Criteria for Readmission and/or Poor Patient Outcomes: Heart failure hospital admissions (last 6 months): 2  No Show rate: 9 % Difficult social situation: No Demonstrates medication adherence: yes Primary Language: English Literacy level: Reading, writing, and comprehension  Barriers of Care:   New HF Diet/ fluid ( Salt, lunch meats, bacon) Daily weights, continued HF Education  Considerations/Referrals:   Referral made to Heart Failure Pharmacist Stewardship: Yes Referral made to Heart Failure CSW/NCM TOC: No Referral made to Heart & Vascular TOC clinic: Yes, 03/09/22 @ 9 am.  Items for Follow-up on DC/TOC: New HF Diet/ fluid/ daily weights ( salt, lunch meats, bacon) Continued HF education   Earnestine Leys, BSN, Lawyer Chat Only

## 2022-02-26 NOTE — Progress Notes (Addendum)
HD#5 Subjective:   Summary: This is a 66 year old female with past medical history of HFrEF (EF 25 to 30%), GERD, questionable cirrhosis, hypertension, type 2 diabetes mellitus, hyperlipidemia who presented with concerns of shortness of breath.  Patient found in acute exacerbation of HFrEF.  Patient was admitted for further treatment and management.  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  Overnight Events: No overnight events   Patient states that she is better. No other concerns today. Patient reports that she has been having to urinate a lot. She has no chest pain, lightheadedness, or vision changes.  Objective:  Vital signs in last 24 hours: Vitals:   02/25/22 1550 02/25/22 1933 02/26/22 0544 02/26/22 1124  BP: 115/72 (!) 109/92 121/74 99/66  Pulse: 80 79 79 73  Resp: '18 18 18 18  '$ Temp: 98.6 F (37 C) (!) 97.5 F (36.4 C) 97.8 F (36.6 C)   TempSrc: Oral Oral    SpO2: 98% 96% 98% 97%  Weight:   (!) 144.2 kg   Height:       Supplemental O2: Room Air SpO2: 97 %   Physical Exam:   Constitutional: Patient sitting in bed in no acute distress HENT: normocephalic atraumatic, mucous membranes moist Eyes: conjunctiva non-erythematous Neck: JVD noted up to earlobe Cardiovascular: regular rate and rhythm, no m/r/g Pulmonary/Chest: normal work of breathing on room air, lungs clear to auscultation bilaterally Abdominal: soft, non-tender, non-distended MSK: normal bulk and tone Neurological: alert & oriented x 3 Skin: warm and dry Extremities: 2+ pitting edema to bilateral lower extremities  Filed Weights   02/24/22 0505 02/25/22 0438 02/26/22 0544  Weight: (!) 147.7 kg (!) 145.6 kg (!) 144.2 kg     Intake/Output Summary (Last 24 hours) at 02/26/2022 1414 Last data filed at 02/26/2022 1307 Gross per 24 hour  Intake 840 ml  Output 4225 ml  Net -3385 ml   Net IO Since Admission: -17,921.57 mL [02/26/22  1414]  Pertinent Labs:    Latest Ref Rng & Units 02/25/2022   12:50 AM 02/24/2022   12:48 AM 02/23/2022    9:01 AM  CBC  WBC 4.0 - 10.5 K/uL 2.9  3.1    Hemoglobin 12.0 - 15.0 g/dL 12.4  12.3  12.6   Hematocrit 36.0 - 46.0 % 43.2  43.4  37.0   Platelets 150 - 400 K/uL 211  218         Latest Ref Rng & Units 02/26/2022   12:43 AM 02/25/2022   12:50 AM 02/24/2022   12:48 AM  CMP  Glucose 70 - 99 mg/dL 132  154  132   BUN 8 - 23 mg/dL '19  17  18   '$ Creatinine 0.44 - 1.00 mg/dL 1.46  1.40  1.46   Sodium 135 - 145 mmol/L 137  140  142   Potassium 3.5 - 5.1 mmol/L 3.7  3.8  3.9   Chloride 98 - 111 mmol/L 100  101  103   CO2 22 - 32 mmol/L '28  27  29   '$ Calcium 8.9 - 10.3 mg/dL 8.9  9.3  9.3     Imaging: No results found.  Assessment/Plan:   Principal Problem:   Acute HFrEF (heart failure with reduced ejection fraction) ([place]) Active Problems:   HTN (hypertension)   Diabetes mellitus without complication (HCC)   HLD (hyperlipidemia)   GERD (gastroesophageal reflux disease)   Cirrhosis ([place])   Patient Summary: Tina Lara  is a 66 y.o. female with past medical history of HFrEF (EF 25 to 30%), GERD, questionable cirrhosis, hypertension, type 2 diabetes mellitus, hyperlipidemia who presented with concerns of shortness of breath.  Patient found in acute exacerbation of HFrEF.  Patient was admitted for further treatment and management.  #High-output cardiac failure Patient has output of 3 L after 80 mg IV Lasix yesterday.  Patient's respiratory status is back to baseline.  Patient still has volume overload on exam with JVD and 2+ pitting edema.  Etiology of high-output cardiac failure likely secondary to cirrhosis, anemia, or obesity thyroid etiology was ruled out.  Continue with slowly introducing GDMT.  Cardiology is planning TTE tomorrow. -TTE tomorrow -Cardiology following, appreciate recommendations -Continue diuresing with Lasix 40 mg IV BID for 1 more day, and plan  to transition to p.o. Lasix tomorrow -Strict I's and O's -Daily weights -Conitnue Farxiga 10 mg daily -Continue carvedilol 6.25 mg twice daily today -Continue spironolactone 12.5 mg daily -Goal Mag >2, and goal K>4.0 -Start Entresto 24-26 mg today   #WHO class II pulmonary artery hypertension Cardiac catheterization revealed the patient has pulmonary artery hypertension in the setting of left heart failure. -Cardiology following, appreciate recs -Currently on Farxiga 10 mg daily  -Continue carvedilol 6.25 mg BID today -Continue spironolactone 12.5 mg daily -Add Entresto 24-26 mg today   #Prerenal AKI, improving  Patient's baseline creatinine is around 0.8-1.0.  Patient's creatinine decreased to 1.40 from 1.46 today.  Will continue to diurese patient. -Continue to monitor BMP -Continue diuresing  #Hypertension Patient's blood pressure currently measuring into the between 110s and 130s.  Carvedilol and spironolactone started yesterday. Will continue to monitor blood pressure -Continue to monitor blood pressure -Continue carvedilol 6.25 mg twice daily -Continue spironolactone 12.5 mg daily -Will add Entresto once patient is transitioned to p.o. Lasix  #Hypomagnesemia, resolved #Hypokalemia, resolved  Patient's mag 2.0. K is 3.7.  -Recheck BMP and mag in AM given patient is continuously diuresing  #GERD -Continue Protonix 40 mg daily  #Type 2 diabetes mellitus Patient is a type II diabetic.  Patient's home med includes Farxiga 10 mg.  Patient's most recent A1c was 7.8 on 01/15/2022.  Patient's glucose today has been ranging between 132-161.  Patient is currently on sliding scale insulin has only used 0 unit in the past 24 hours. -Continue sliding scale insulin -Continue Farxiga 10 mg daily -Continue to monitor blood glucose  #Possible cirrhosis Patient had an abdominal CT on 01/28/2022 showing possible cirrhosis on imaging.  Patient CMP within normal limits.  Patient does have  decreased albumin at 3.3.  Liver enzymes within normal limits.  PTT 17 and INR 1.4.  Patient does not have an acute decompensation at this time, but needs to be worked up further outpatient.  Patient does have outpatient ultrasound elastography liver pending.  Given diagnosis of high-output cardiac failure, cirrhosis could be etiology. -Follow-up outpatient ultrasound elastography liver  #Iron deficiency anemia Patient has a history of iron deficiency anemia, and takes iron supplements at home.  Patient's iron deficit was around 800 mg.  Patient completed IV Ferrlecit.  Continue oral supplementation of 325 mg ferrous sulfate every other day -CBC pending -Continue ferrous sulfate 325 mg ferrous sulfate every other day  #Mild leukopenia Unclear etiology currently but CBC showing decreased white blood cell count at 2.9. Reticulocyte count within normal limits.  -Unclear etiology at this moment -Consider outpatient hematology follow-up  #Hyperlipidemia -Continue atorvastatin 40 mg nightly -Continue aspirin 81 mg daily  Diet: Heart  Healthy IVF: None,None VTE: Enoxaparin Code: Full  Dispo: Anticipated discharge to Home in 1 days pending clinical improvement.   [facility] Resident PGY-1 [phone] Please contact the on call pager after 5 pm and on weekends at [phone].

## 2022-02-26 NOTE — Plan of Care (Signed)
  Problem: Clinical Measurements: Goal: Respiratory complications will improve Outcome: Completed/Met   Problem: Skin Integrity: Goal: Risk for impaired skin integrity will decrease Outcome: Completed/Met

## 2022-02-26 NOTE — Progress Notes (Signed)
   Heart Failure Stewardship Pharmacist Progress Note   PCP: Velna Ochs, MD PCP-Cardiologist: Janina Mayo, MD    HPI:  66 yo F with PMH of GERD, cirrhosis, HTN, DM, obesity, and HLD.  She was seen in the ED on 01/28/2022 with SOB for 1 weeks and orthopnea. Signs of CHF. She also had leg swelling. In the ED she also reported recent trip to [place]. She underwent CT PE. No PE. Liver c/f possible cirrhosis. She was given a dose of lasix in the ED and recommended to increase it for 2 days. They messaged her PCP regarding an upcoming visit. Arranged from FU with cardiology. No plan for admission or further w/u.   She presented to clinic on 11/8 for the above symptoms. She had an echo done showing systolic BiV failure EF 03-50%, moderately dilated RV, grade III diastolic dysfunction. Small pericardial effusion. She had elevated Kappa/Lambda ratio 2.45. BNP 1423.  Came back for follow up on 11/21. Reports 15 lb down after diuresing with torsemide but still had JVD, dyspnea, and significant LE edema. She was sent for direct admission to the hospital for management.   R/LHC on 11/24 showed high cardiac output and mild pulmonary hypertension without obstructive CAD. Repeat ECHO pending.  Current HF Medications: Diuretic: furosemide 40 mg IV BID Beta Blocker: carvedilol 6.25 mg BID ACE/ARB/ARNI: Entresto 24/26 mg BID MRA: spironolactone 12.5 mg daily SGLT2i: Farxiga 10 mg daily  Prior to admission HF Medications: Diuretic: torsemide 40 mg daily ACE/ARB/ARNI: lisinopril 10 mg daily *not taking [place]  Pertinent Lab Values: Serum creatinine 1.46, BUN 19, Potassium 3.7, Sodium 137, BNP 1423.2, A1c 7.8, Magnesium 2.0 11/8- TSAT 7, Ferritin 74 11/23- TSAT 27, Ferritin 40  Vital Signs: Weight: 318 lbs (admission weight: 345 lbs) Blood pressure: 100-120/70s  Heart rate: 70s  I/O: -2.9L yesterday, net -17.9  Medication Assistance / Insurance Benefits Check: Does the patient have  prescription insurance?  Yes Type of insurance plan: UHC Medicare  Does the patient qualify for medication assistance through manufacturers or grants?   Pending Eligible grants and/or patient assistance programs: pending Medication assistance applications in progress: none  Medication assistance applications approved: none Approved medication assistance renewals will be completed by: pending  Outpatient Pharmacy:  Prior to admission outpatient pharmacy: [facility] Is the patient willing to use [facility] pharmacy at discharge? Yes Is the patient willing to transition their outpatient pharmacy to utilize a [facility] outpatient pharmacy?   Pending    Assessment: 1. Acute on chronic systolic CHF (LVEF 09-38%), due to NICM. NYHA class III symptoms. - Remains volume overloaded on exam, continue furosemide 80 mg IV BID. Strict I/Os and daily weights. Keep K>4 and Mag>2. - Continue carvedilol 6.25 mg BID, with BiV failure will need to be cautious with titrations. - Agree with starting Entresto 24/26 mg BID - Continue spironolactone 12.5 mg daily and Farxiga 10 mg daily - Iron stores low, s/p Ferrlecit 125 mg x 4 doses 11/22-11/25   Plan: 1) Medication changes recommended at this time: - Continue current regimen  2) Patient assistance: Delene Loll copay $47 - Farxiga/Jardiance copay $47  3)  Education  - To be completed prior to discharge  Kerby Nora, PharmD, BCPS Heart Failure Stewardship Pharmacist Phone [phone]

## 2022-02-26 NOTE — Progress Notes (Signed)
Cardiology Progress Note  Patient ID: Tina Lara MRN: [id number] DOB: [birth year] Date of Encounter: 02/26/2022  Primary Cardiologist: Janina Mayo, MD  Subjective   Chief Complaint: SOB  HPI: Still slightly volume up.  Approaching euvolemia.  Reports shortness of breath.  ROS:  All other ROS reviewed and negative. Pertinent positives noted in the HPI.     Inpatient Medications  Scheduled Meds:  aspirin EC  81 mg Oral Daily   atorvastatin  40 mg Oral QHS   bisacodyl  5 mg Oral Daily   carvedilol  6.25 mg Oral BID WC   dapagliflozin propanediol  10 mg Oral Daily   enoxaparin (LOVENOX) injection  75 mg Subcutaneous Q24H   ferrous sulfate  325 mg Oral QODAY   furosemide  40 mg Intravenous Q12H   insulin aspart  0-5 Units Subcutaneous QHS   insulin aspart  0-9 Units Subcutaneous TID WC   pantoprazole  40 mg Oral Daily   sacubitril-valsartan  1 tablet Oral BID   senna-docusate  1 tablet Oral Daily   spironolactone  12.5 mg Oral Daily   Continuous Infusions:  PRN Meds: acetaminophen, bisacodyl, ondansetron (ZOFRAN) IV   Vital Signs   Vitals:   02/25/22 0820 02/25/22 1550 02/25/22 1933 02/26/22 0544  BP: 101/69 115/72 (!) 109/92 121/74  Pulse: 80 80 79 79  Resp: '20 18 18 18  '$ Temp: 97.6 F (36.4 C) 98.6 F (37 C) (!) 97.5 F (36.4 C) 97.8 F (36.6 C)  TempSrc: Oral Oral Oral   SpO2: 97% 98% 96% 98%  Weight:    (!) 144.2 kg  Height:        Intake/Output Summary (Last 24 hours) at 02/26/2022 1107 Last data filed at 02/26/2022 5573 Gross per 24 hour  Intake 960 ml  Output 3125 ml  Net -2165 ml      02/26/2022    5:44 AM 02/25/2022    4:38 AM 02/24/2022    5:05 AM  Last 3 Weights  Weight (lbs) 318 lb 321 lb 325 lb 9.6 oz  Weight (kg) 144.244 kg 145.605 kg 147.691 kg      Telemetry  Overnight telemetry shows sinus rhythm in the 80s, which I personally reviewed.   Physical Exam   Vitals:   02/25/22 0820 02/25/22 1550 02/25/22 1933 02/26/22  0544  BP: 101/69 115/72 (!) 109/92 121/74  Pulse: 80 80 79 79  Resp: '20 18 18 18  '$ Temp: 97.6 F (36.4 C) 98.6 F (37 C) (!) 97.5 F (36.4 C) 97.8 F (36.6 C)  TempSrc: Oral Oral Oral   SpO2: 97% 98% 96% 98%  Weight:    (!) 144.2 kg  Height:        Intake/Output Summary (Last 24 hours) at 02/26/2022 1107 Last data filed at 02/26/2022 2202 Gross per 24 hour  Intake 960 ml  Output 3125 ml  Net -2165 ml       02/26/2022    5:44 AM 02/25/2022    4:38 AM 02/24/2022    5:05 AM  Last 3 Weights  Weight (lbs) 318 lb 321 lb 325 lb 9.6 oz  Weight (kg) 144.244 kg 145.605 kg 147.691 kg    Body mass index is 46.96 kg/m.  General: Well nourished, well developed, in no acute distress Head: Atraumatic, normal size  Eyes: PEERLA, EOMI  Neck: Supple, no JVD Endocrine: No thryomegaly Cardiac: Normal S1, S2; RRR; no murmurs, rubs, or gallops Lungs: Diminished breath sounds bilaterally Abd: Soft, nontender, no hepatomegaly  Ext: 1+ pitting edema Musculoskeletal: No deformities, BUE and BLE strength normal and equal Skin: Warm and dry, no rashes   Neuro: Alert and oriented to person, place, time, and situation, CNII-XII grossly intact, no focal deficits  Psych: Normal mood and affect   Labs  High Sensitivity Troponin:   Recent Labs  Lab 01/28/22 0741 01/28/22 0944  TROPONINIHS 17 17     Cardiac EnzymesNo results for input(s): "TROPONINI" in the last 168 hours. No results for input(s): "TROPIPOC" in the last 168 hours.  Chemistry Recent Labs  Lab 02/21/22 1743 02/22/22 0027 02/23/22 0035 02/23/22 0858 02/24/22 0048 02/25/22 0050 02/26/22 0043  NA  --  139 141   < > 142 140 137  K  --  3.5 3.3*   < > 3.9 3.8 3.7  CL  --  100 99  --  103 101 100  CO2  --  29 29  --  '29 27 28  '$ GLUCOSE  --  124* 131*  --  132* 154* 132*  BUN  --  24* 22  --  '18 17 19  '$ CREATININE  --  1.53* 1.66*  --  1.46* 1.40* 1.46*  CALCIUM  --  8.9 9.1  --  9.3 9.3 8.9  PROT 8.7* 7.4 8.0  --   --   --    --   ALBUMIN 3.6 3.0* 3.3*  --   --   --   --   AST '27 21 23  '$ --   --   --   --   ALT '17 18 18  '$ --   --   --   --   ALKPHOS 113 90 99  --   --   --   --   BILITOT 1.6* 1.1 1.0  --   --   --   --   GFRNONAA  --  38* 34*  --  40* 42* 40*  ANIONGAP  --  10 13  --  '10 12 9   '$ < > = values in this interval not displayed.    Hematology Recent Labs  Lab 02/23/22 0035 02/23/22 0858 02/23/22 0901 02/24/22 0048 02/25/22 0050  WBC 2.9*  --   --  3.1* 2.9*  RBC 4.86  --   --  5.23* 5.25*  HGB 11.6*   < > 12.6 12.3 12.4  HCT 40.5   < > 37.0 43.4 43.2  MCV 83.3  --   --  83.0 82.3  MCH 23.9*  --   --  23.5* 23.6*  MCHC 28.6*  --   --  28.3* 28.7*  RDW 16.9*  --   --  17.4* 17.0*  PLT 187  --   --  218 211   < > = values in this interval not displayed.   BNP Recent Labs  Lab 02/20/22 1008  BNP 1,423.2*    DDimer No results for input(s): "DDIMER" in the last 168 hours.   Radiology  No results found.  Cardiac Studies  [facility]   CONCLUSIONS: Left heart failure with high cardiac output 9.46 L/min with LVEDP 21 mmHg. Mild pulmonary hypertension with mean PA pressure 34 mmHg.  Pulmonary capillary wedge pressure mean 23 mmHg; pulmonary vascular resistance 1.16 [place] units.  WHO group 2 pulmonary hypertension etiology. Left dominant coronary anatomy.  No obstructive disease.  Mid LAD 40 to 50%.  TTE 02/07/2022  1. Left ventricular ejection fraction, by estimation, is 25 to 30%. The  left ventricle  has severely decreased function. The left ventricle  demonstrates global hypokinesis. There is mild asymmetric left ventricular  hypertrophy of the inferior segment.  Left ventricular diastolic parameters are consistent with Grade III  diastolic dysfunction (restrictive). Elevated left ventricular  end-diastolic pressure.   2. Right ventricular systolic function is moderately reduced. The right  ventricular size is mildly enlarged. There is mildly elevated pulmonary  artery systolic  pressure.   3. The mitral valve is normal in structure. No evidence of mitral valve  regurgitation. No evidence of mitral stenosis.   4. Tricuspid valve regurgitation is moderate to severe.   5. The aortic valve is normal in structure. Aortic valve regurgitation is  mild. Aortic valve sclerosis/calcification is present, without any  evidence of aortic stenosis.   6. There is mild dilatation of the ascending aorta, measuring 37 mm.   7. The inferior vena cava is normal in size with greater than 50%  respiratory variability, suggesting right atrial pressure of 3 mmHg.   Patient Profile  Tina Lara is a 66 y.o. female with cirrhosis, nonobstructive CAD, morbid obesity, diabetes who was admitted on 43/32/9518 for acute systolic heart failure.  Assessment & Plan   #Acute systolic heart failure, EF 25 to 30% #High output heart failure #Cirrhosis #Nonischemic cardiomyopathy -Left heart catheterization with nonobstructive CAD.  Right heart numbers show high output heart failure.  Suspect this is the etiology.  Suspect her cirrhosis is contributing considerably. -Start Entresto 24-26 mg twice daily.  Continue [name] 12.5 mg daily.  Coreg 6.25 mg twice daily.  On Farxiga 10 mg daily. -Approaching euvolemia.  Still with some volume.  We will give Lasix 40 mg IV x2 today. -We will recheck her echocardiogram tomorrow to get a better idea of filling pressures.  To me she is approaching euvolemia.  #Nonobstructive CAD -Aspirin statin.  #Cirrhosis -Lasix and [name].  #CKD stage IIIb -Close monitoring of kidney function.  We will challenge her with Entresto.  Kidney function seems to have reached his nadir.    For questions or updates, please contact [place] Please consult [email or web address] for contact info under   Signed, [place] T. Audie Box, MD, [place]  02/26/2022 11:07 AM

## 2022-02-26 NOTE — Progress Notes (Signed)
Physical Therapy Treatment Patient Details Name: Tina Lara MRN: [id number] DOB: [birth year] Today's Date: 02/26/2022   History of Present Illness Pt is a 66 y.o. F who presents 02/21/2022 with hypervolemia and admitted for CHF management. Significant PMH: HFrEF (EF 25-30%), GERD, ? Cirrhosis, HTN, DM class 3 obesity.    PT Comments    Pt making excellent progress.  She demonstrates good understanding of rollator use and all VSS during session.  Ambulated 200' with rollator but then returned to room as RN needing to start new IV.  Will continue POC.     Recommendations for follow up therapy are one component of a multi-disciplinary discharge planning process, led by the attending physician.  Recommendations may be updated based on patient status, additional functional criteria and insurance authorization.  Follow Up Recommendations  Home health PT     Assistance Recommended at Discharge PRN  Patient can return home with the following A little help with bathing/dressing/bathroom;Assistance with cooking/housework;Assist for transportation;Help with stairs or ramp for entrance   Equipment Recommendations  Rollator (4 wheels) (bariatric)    Recommendations for Other Services       Precautions / Restrictions Precautions Precautions: Fall     Mobility  Bed Mobility               General bed mobility comments: Sitting EOB upon arrival    Transfers Overall transfer level: Needs assistance Equipment used: Rollator (4 wheels) Transfers: Sit to/from Stand Sit to Stand: Supervision           General transfer comment: Supervision for safety; educated on rollator brakes and "parking" on a wall for rest breaks;    Ambulation/Gait Ambulation/Gait assistance: Supervision Gait Distance (Feet): 200 Feet Assistive device: Rollator (4 wheels) Gait Pattern/deviations: Wide base of support Gait velocity: decreased     General Gait Details: Wider BOS, increased  lateral sway, supervision for safety; VSS; educated on rollator for rest breaks as needed.  200' in hall with rollator but did walk 12 ' in room without AD   Stairs             Wheelchair Mobility    Modified Rankin (Stroke Patients Only)       Balance Overall balance assessment: Needs assistance Sitting-balance support: Feet supported Sitting balance-Leahy Scale: Good     Standing balance support: No upper extremity supported Standing balance-Leahy Scale: Good Standing balance comment: walked 12' without AD                            Cognition Arousal/Alertness: Awake/alert Behavior During Therapy: WFL for tasks assessed/performed Overall Cognitive Status: Within Functional Limits for tasks assessed                                          Exercises      General Comments General comments (skin integrity, edema, etc.): VSS      Pertinent Vitals/Pain Pain Assessment Pain Assessment: No/denies pain    Home Living                          Prior Function            PT Goals (current goals can now be found in the care plan section) Progress towards PT goals: Progressing toward goals    Frequency  Min 3X/week      PT Plan Current plan remains appropriate    Co-evaluation              AM-PAC PT "6 Clicks" Mobility   Outcome Measure  Help needed turning from your back to your side while in a flat bed without using bedrails?: None Help needed moving from lying on your back to sitting on the side of a flat bed without using bedrails?: None Help needed moving to and from a bed to a chair (including a wheelchair)?: A Little Help needed standing up from a chair using your arms (e.g., wheelchair or bedside chair)?: A Little Help needed to walk in hospital room?: A Little Help needed climbing 3-5 steps with a railing? : A Little 6 Click Score: 20    End of Session Equipment Utilized During Treatment: Gait  belt Activity Tolerance: Patient tolerated treatment well Patient left: in bed;with call bell/phone within reach Nurse Communication: Mobility status PT Visit Diagnosis: Unsteadiness on feet (R26.81);Difficulty in walking, not elsewhere classified (R26.2)     Time: 1310-1320 PT Time Calculation (min) (ACUTE ONLY): 10 min  Charges:  $Gait Training: 8-22 mins                     Abran Richard, PT Acute Rehab [facility] [place] 02/26/2022, 1:27 PM

## 2022-02-26 NOTE — Care Management Important Message (Signed)
Important Message  Patient Details  Name: Tina Lara MRN: [id number] Date of Birth: 04/08/1955   Medicare Important Message Given:  Yes     [name] 02/26/2022, 9:48 AM

## 2022-02-27 ENCOUNTER — Inpatient Hospital Stay (HOSPITAL_COMMUNITY): Payer: Medicare Other

## 2022-02-27 ENCOUNTER — Other Ambulatory Visit (HOSPITAL_COMMUNITY): Payer: Self-pay

## 2022-02-27 DIAGNOSIS — Z7984 Long term (current) use of oral hypoglycemic drugs: Secondary | ICD-10-CM

## 2022-02-27 DIAGNOSIS — R188 Other ascites: Secondary | ICD-10-CM

## 2022-02-27 DIAGNOSIS — I5021 Acute systolic (congestive) heart failure: Secondary | ICD-10-CM

## 2022-02-27 DIAGNOSIS — K7460 Unspecified cirrhosis of liver: Secondary | ICD-10-CM

## 2022-02-27 DIAGNOSIS — E119 Type 2 diabetes mellitus without complications: Secondary | ICD-10-CM

## 2022-02-27 LAB — BASIC METABOLIC PANEL
Anion gap: 9 (ref 5–15)
BUN: 21 mg/dL (ref 8–23)
CO2: 26 mmol/L (ref 22–32)
Calcium: 9 mg/dL (ref 8.9–10.3)
Chloride: 101 mmol/L (ref 98–111)
Creatinine, Ser: 1.69 mg/dL — ABNORMAL HIGH (ref 0.44–1.00)
GFR, Estimated: 34 mL/min — ABNORMAL LOW (ref >60)
Glucose, Bld: 153 mg/dL — ABNORMAL HIGH (ref 70–99)
Potassium: 3.6 mmol/L (ref 3.5–5.1)
Sodium: 136 mmol/L (ref 135–145)

## 2022-02-27 LAB — CBC WITH DIFFERENTIAL/PLATELET
Abs Immature Granulocytes: 0.01 10*3/uL (ref 0.00–0.07)
Basophils Absolute: 0 10*3/uL (ref 0.0–0.1)
Basophils Relative: 1 %
Eosinophils Absolute: 0.1 10*3/uL (ref 0.0–0.5)
Eosinophils Relative: 3 %
HCT: 44.3 % (ref 36.0–46.0)
Hemoglobin: 13 g/dL (ref 12.0–15.0)
Immature Granulocytes: 0 %
Lymphocytes Relative: 18 %
Lymphs Abs: 0.6 10*3/uL — ABNORMAL LOW (ref 0.7–4.0)
MCH: 24.3 pg — ABNORMAL LOW (ref 26.0–34.0)
MCHC: 29.3 g/dL — ABNORMAL LOW (ref 30.0–36.0)
MCV: 82.6 fL (ref 80.0–100.0)
Monocytes Absolute: 0.6 10*3/uL (ref 0.1–1.0)
Monocytes Relative: 18 %
Neutro Abs: 2.1 10*3/uL (ref 1.7–7.7)
Neutrophils Relative %: 60 %
Platelets: 196 10*3/uL (ref 150–400)
RBC: 5.36 MIL/uL — ABNORMAL HIGH (ref 3.87–5.11)
RDW: 17.2 % — ABNORMAL HIGH (ref 11.5–15.5)
WBC: 3.5 10*3/uL — ABNORMAL LOW (ref 4.0–10.5)
nRBC: 0 % (ref 0.0–0.2)

## 2022-02-27 LAB — ECHOCARDIOGRAM LIMITED
Area-P 1/2: 3.85 cm2
Calc EF: 36 %
Height: 69 in
S' Lateral: 4.7 cm
Single Plane A2C EF: 38 %
Single Plane A4C EF: 33.8 %
Weight: 5027.2 oz

## 2022-02-27 LAB — MAGNESIUM: Magnesium: 2 mg/dL (ref 1.7–2.4)

## 2022-02-27 LAB — GLUCOSE, CAPILLARY: Glucose-Capillary: 161 mg/dL — ABNORMAL HIGH (ref 70–99)

## 2022-02-27 MED ORDER — CARVEDILOL 6.25 MG PO TABS
6.2500 mg | ORAL_TABLET | Freq: Two times a day (BID) | ORAL | 0 refills | Status: DC
  Filled 2022-02-27: qty 60, 30d supply, fill #0

## 2022-02-27 MED ORDER — ENOXAPARIN SODIUM 80 MG/0.8ML IJ SOSY: 70.0000 mg | PREFILLED_SYRINGE | INTRAMUSCULAR | Status: DC

## 2022-02-27 MED ORDER — FERROUS SULFATE 325 (65 FE) MG PO TABS
325.0000 mg | ORAL_TABLET | ORAL | 3 refills | Status: DC
  Filled 2022-02-27: qty 15, 30d supply, fill #0

## 2022-02-27 MED ORDER — SACUBITRIL-VALSARTAN 24-26 MG PO TABS
1.0000 | ORAL_TABLET | Freq: Two times a day (BID) | ORAL | 1 refills | Status: DC
  Filled 2022-02-27: qty 60, 30d supply, fill #0

## 2022-02-27 MED ORDER — SPIRONOLACTONE 25 MG PO TABS
12.5000 mg | ORAL_TABLET | Freq: Every day | ORAL | 0 refills | Status: DC
  Filled 2022-02-27: qty 15, 30d supply, fill #0

## 2022-02-27 MED ORDER — POTASSIUM CHLORIDE CRYS ER 20 MEQ PO TBCR
40.0000 meq | EXTENDED_RELEASE_TABLET | Freq: Once | ORAL | Status: AC
  Administered 2022-02-27: 40 meq via ORAL
  Filled 2022-02-27: qty 2

## 2022-02-27 MED ORDER — DAPAGLIFLOZIN PROPANEDIOL 10 MG PO TABS
10.0000 mg | ORAL_TABLET | Freq: Every day | ORAL | 0 refills | Status: AC
  Filled 2022-02-27: qty 30, 30d supply, fill #0

## 2022-02-27 MED ORDER — TORSEMIDE 20 MG PO TABS
20.0000 mg | ORAL_TABLET | Freq: Every day | ORAL | 0 refills | Status: DC
  Filled 2022-02-27: qty 30, 30d supply, fill #0

## 2022-02-27 MED ORDER — TORSEMIDE 20 MG PO TABS
20.0000 mg | ORAL_TABLET | Freq: Every day | ORAL | Status: DC
  Administered 2022-02-27: 20 mg via ORAL
  Filled 2022-02-27: qty 1

## 2022-02-27 NOTE — Progress Notes (Signed)
PT Cancellation Note  Patient Details Name: TAKEIRA YANES MRN: [id number] DOB: Jan 12, 1956   Cancelled Treatment:    Reason Eval/Treat Not Completed: (P) Other (comment) (pt declining, up walking in room waiting for meds for D/C, pt without questions) Will continue to follow acutely.   Audry Riles. PTA Acute Rehabilitation Services Office: [place] 02/27/2022, 2:57 PM

## 2022-02-27 NOTE — TOC Transition Note (Signed)
Transition of Care [facility]) - CM/SW Discharge Note   Patient Details  Name: Tina Lara MRN: [id number] Date of Birth: 01/21/1956  Transition of Care [facility] CM/SW Contact:  Zenon Mayo, RN Phone Number: 02/27/2022, 11:01 AM   Clinical Narrative:    NCM spoke with patient at the bedsied, offered choice with Medicare.gov list, for HHPT and DME, she does not have a preference.  NCM made rerferral for HHPT to Cyndi with [facility], she is able to take referral.  Soc will begin 24 to 48hrs post dc.  NCM made referral to [facility] with [employer] for the bariatric rolling walker. This will be brought up to patient's room.  Patient states she will be going to her daughter's address at  [address] [place] [address].  Patient states her daughter will transport her home at dc.   Final next level of care: [place] Barriers to Discharge: No Barriers Identified   Patient Goals and CMS Choice Patient states their goals for this hospitalization and ongoing recovery are:: return home with daughtert CMS Medicare.gov Compare Post Acute Care list provided to:: Patient Choice offered to / list presented to : Patient  Discharge Placement                       Discharge Plan and Services                DME Arranged: Walker rolling with seat DME Agency: [employer] Date DME Agency Contacted: 02/27/22 Time DME Agency Contacted: 1100 Representative spoke with at DME Agency: Ulice Dash HH Arranged: PT [place]: [place] Date [place]: 02/27/22 Time HH Agency Contacted: 1100 Representative spoke with at [place]: Cyndi  Social Determinants of Health (SDOH) Interventions Housing Interventions: Intervention Not Indicated Transportation Interventions: Intervention Not Indicated Utilities Interventions: Intervention Not Indicated Alcohol Usage Interventions: Intervention Not Indicated (Score <7) Financial Strain Interventions: Other  (Comment) (cost)   Readmission Risk Interventions     No data to display

## 2022-02-27 NOTE — Progress Notes (Signed)
Heart Failure Stewardship Pharmacist Progress Note   PCP: Tina Ochs, MD PCP-Cardiologist: Tina Mayo, MD    HPI:  66 yo F with PMH of GERD, cirrhosis, HTN, DM, obesity, and HLD.  She was seen in the ED on 01/28/2022 with SOB for 1 weeks and orthopnea. Signs of CHF. She also had leg swelling. In the ED she also reported recent trip to [place]. She underwent CT PE. No PE. Liver c/f possible cirrhosis. She was given a dose of lasix in the ED and recommended to increase it for 2 days. They messaged her PCP regarding an upcoming visit. Arranged from FU with cardiology. No plan for admission or further w/u.   She presented to clinic on 11/8 for the above symptoms. She had an echo done showing systolic BiV failure EF 41-66%, moderately dilated RV, grade III diastolic dysfunction. Small pericardial effusion. She had elevated Kappa/Lambda ratio 2.45. BNP 1423.  Came back for follow up on 11/21. Reports 15 lb down after diuresing with torsemide but still had JVD, dyspnea, and significant LE edema. She was sent for direct admission to the hospital for management.   R/LHC on 11/24 showed high cardiac output and mild pulmonary hypertension without obstructive CAD. Repeat ECHO pending.  Current HF Medications: Diuretic: torsemide 20 mg daily Beta Blocker: carvedilol 6.25 mg BID ACE/ARB/ARNI: Entresto 24/26 mg BID MRA: spironolactone 12.5 mg daily SGLT2i: Farxiga 10 mg daily  Prior to admission HF Medications: Diuretic: torsemide 40 mg daily ACE/ARB/ARNI: lisinopril 10 mg daily *not taking [place]  Pertinent Lab Values: Serum creatinine 1.69, BUN 21, Potassium 3.6, Sodium 136, BNP 1423.2, A1c 7.8, Magnesium 2.0 11/8- TSAT 7, Ferritin 74 11/23- TSAT 27, Ferritin 40  Vital Signs: Weight: 314 lbs (admission weight: 345 lbs) Blood pressure: 110/60s  Heart rate: 70s  I/O: -4.1L yesterday, net -20.5L  Medication Assistance / Insurance Benefits Check: Does the patient have prescription  insurance?  Yes Type of insurance plan: UHC Medicare  Does the patient qualify for medication assistance through manufacturers or grants?   Pending Eligible grants and/or patient assistance programs: pending Medication assistance applications in progress: none  Medication assistance applications approved: none Approved medication assistance renewals will be completed by: pending  Outpatient Pharmacy:  Prior to admission outpatient pharmacy: [facility] Is the patient willing to use [place] pharmacy at discharge? Yes Is the patient willing to transition their outpatient pharmacy to utilize a [facility] outpatient pharmacy?   Pending    Assessment: 1. Acute on chronic systolic CHF (LVEF 06-30%), due to NICM. NYHA class III symptoms. - Agree with transitioning to torsemide 20 mg daily. Strict I/Os and daily weights. Keep K>4 and Mag>2. KCl 40 mEq x 1 given for replacement. - Continue carvedilol 6.25 mg BID, with BiV failure will need to be cautious with titrations. - Continue Entresto 24/26 mg BID - Continue spironolactone 12.5 mg daily and Farxiga 10 mg daily - Iron stores low, s/p Ferrlecit 125 mg x 4 doses 11/22-11/25   Plan: 1) Medication changes recommended at this time: - Continue current regimen  2) Patient assistance: Delene Loll copay $47 - Farxiga/Jardiance copay $47  3)  Education  - Patient has been educated on current HF medications and potential additions to HF medication regimen - Patient verbalizes understanding that over the next few months, these medication doses may change and more medications may be added to optimize HF regimen - Patient has been educated on basic disease state pathophysiology and goals of therapy   Tina Lara  Tina Lara, PharmD, BCPS Heart Failure Cytogeneticist Phone [phone]

## 2022-02-27 NOTE — Discharge Summary (Signed)
Name: Tina Lara MRN: [id number] DOB: [birth year] 66 y.o. PCP: Velna Ochs, MD  Date of Admission: 02/21/2022  8:32 AM Date of Discharge: 02/27/2022 Attending Physician: Dr. Johnnye Sima   Discharge Diagnosis: Principal Problem:   Acute HFrEF (heart failure with reduced ejection fraction) ([place]) Active Problems:   HTN (hypertension)   Diabetes mellitus without complication (HCC)   HLD (hyperlipidemia)   GERD (gastroesophageal reflux disease)   Cirrhosis ([place])    Discharge Medications: Allergies as of 02/27/2022       Reactions   Percocet [oxycodone-acetaminophen] Itching        Medication List     STOP taking these medications    lisinopril 10 MG tablet Commonly known as: ZESTRIL       TAKE these medications    acetaminophen 500 MG tablet Commonly known as: TYLENOL Take 1,500 mg by mouth every 6 (six) hours as needed for moderate pain or headache.   ARTIFICIAL TEARS OP Place 1 drop into both eyes daily as needed (for dry eyes).   aspirin EC 81 MG tablet Take 1 tablet (81 mg total) by mouth daily.   atorvastatin 40 MG tablet Commonly known as: Lipitor Take 1 tablet (40 mg total) by mouth daily.   BIOFREEZE EX Apply 1 application  topically daily as needed (back pain).   carvedilol 6.25 MG tablet Commonly known as: COREG Take 1 tablet (6.25 mg total) by mouth 2 (two) times daily with a meal.   dapagliflozin propanediol 10 MG Tabs tablet Commonly known as: Farxiga Take 1 tablet (10 mg total) by mouth daily before breakfast.   dicyclomine 20 MG tablet Commonly known as: BENTYL Take 1 tablet (20 mg total) by mouth 2 (two) times daily as needed for spasms (abdominal pain).   ferrous sulfate 325 (65 FE) MG tablet Take 1 tablet (325 mg total) by mouth every other day. What changed: when to take this   ibuprofen 200 MG tablet Commonly known as: ADVIL Take 400 mg by mouth every 6 (six) hours as needed for headache or moderate pain.    lactulose 10 GM/15ML solution Commonly known as: CHRONULAC Take 15 mLs (10 g total) by mouth 2 (two) times daily as needed for mild constipation.   pantoprazole 40 MG tablet Commonly known as: PROTONIX Take 1 tablet (40 mg total) by mouth daily.   sacubitril-valsartan 24-26 MG Commonly known as: ENTRESTO Take 1 tablet by mouth 2 (two) times daily.   Semaglutide(0.25 or 0.'5MG'$ /DOS) 2 MG/3ML Sopn Inject 0.25 mg into the skin once a week.   spironolactone 25 MG tablet Commonly known as: [name] Take 0.5 tablets (12.5 mg total) by mouth daily. Start taking on: February 28, 2022   torsemide 20 MG tablet Commonly known as: DEMADEX Take 1 tablet (20 mg total) by mouth daily. What changed: how much to take               Durable Medical Equipment  (From admission, onward)           Start     Ordered   02/27/22 0000  For home use only DME 4 wheeled rolling walker with seat       Question:  Patient needs a walker to treat with the following condition  Answer:  Weakness   02/27/22 1337   02/26/22 1324  For home use only DME Walker rolling  Once       Question Answer Comment  Walker: Other   Comments bariatric rollator   Patient needs  a walker to treat with the following condition Gait abnormality      02/26/22 1323            Disposition and follow-up:   Tina Lara was discharged from [facility] in Good condition.  At the hospital follow up visit please address:  1.  Follow-up:  a.  High-output cardiac failure: Patient was diagnosed with high-output cardiac failure via cath during admission.  Patient started on GDMT with Coreg, Farxiga, spironolactone, and Entresto.  Ensure patient's respiratory status stays at baseline.  Ensure patient has refills on all of her medications.  Patient has 1 week follow-up with heart failure team.    b.  Leukopenia: Patient's white blood cell count at 3.5, unclear etiology.  Consider sending patient for  hematology/oncology appointment for outpatient workup.   c.  Questionable cirrhosis: This could be the etiology of high-output cardiac failure.  Please follow-up liver elastography outpatient.  Please ensure patient has appointment still and shows up to appointment.   D.  CKD 3B: Ensure patient's kidney function remains at baseline.  2.  Labs / imaging needed at time of follow-up: BMP  3.  Pending labs/ test needing follow-up: Echocardiogram   4.  Medication Changes  1) Patient discharged on Coreg 6.25 mg BID, Farxiga 10 mg daily, torsemide 20 mg daily, spironolactone 12.5 mg daily, and Entresto 24-26 mg tablet BID.  Follow-up Appointments:  Follow-up Information     [facility]. Go in 11 day(s).   Specialty: Cardiology Why: Hospital follow up PLEASE bring a current medication list to [place] parking, Entrance , off [address] information: [address] [phone] [place] Follow up.   Why: bariatric Rollator will be delivered to room        Care, [facility] Follow up.   Specialty: [place] Why: [place] will contact you to set up apt times Contact information: [place] [address] [phone]         Velna Ochs, MD. Go on 03/07/2022.   Specialty: Internal Medicine Why: @ 8:15am Contact information: [place] [address] [place] [place] Hospital Course by problem list: Tina Lara is a 66 y.o. female with past medical history of HFrEF (EF 25 to 30%), GERD, questionable cirrhosis, hypertension, type 2 diabetes mellitus, hyperlipidemia who presented with concerns of shortness of breath.  Patient found in acute exacerbation of HFrEF.  Patient was admitted for further treatment and management.   #High-output cardiac  failure Patient was found to have high-output cardiac failure during hospitalization.  Etiology is currently obesity versus cirrhosis versus anemia.  Patient initially presented to the emergency room with concerns of shortness of breath.  Patient was found to be in acute heart failure exacerbation.  Patient's most recent echocardiogram showed ejection fraction of 25 to 30%.  Patient had a heart cath during hospitalization, which showed high-output cardiac failure with no ischemic disease.  Patient was started on GDMT including Entresto, Farxiga, Coreg, and viral lactone.  Patient is to follow-up with cardiology team.  Patient became euvolemic during hospitalization with more than 20 L being pulled off.  Patient to be discharged on GDMT with close follow-up with heart failure clinic as well as PCP.     #  WHO class II pulmonary artery hypertension Cardiac catheterization during hospitalization revealed patient had WHO class II pulmonary artery hypertension.  Likely secondary to left heart failure.  Patient discharged on GDMT.  Follow-up echo outpatient.   #CKD IIIb  Patient creatinine remained at baseline during hospitalization.  Ensure patient's creatinine remains baseline around 1.2-1.4.    #Hypertension Patient's blood pressures during hospitalization remained between 110s and 130s.  Patient remains on Coreg, spironolactone, and Entresto.  Continue to monitor blood pressure outpatient.    #Hypomagnesemia, resolved #Hypokalemia, resolved  Magnesium and potassium during hospitalization repleted as needed.  #GERD Patient continue to be on Protonix daily and hospitalization.   #Type 2 diabetes mellitus Patient's sugars during hospitalization remained within normal limits.  Patient did start Farxiga 10 mg daily during hospitalization.  Patient had minimal use of sliding scale insulin.  Patient discharged with 10 mg Farxiga.    #Possible cirrhosis Patient to have outpatient elastography of liver  outpatient.  Possible cirrhosis could be etiology of her high-output cardiac failure.  Continue to follow-up outpatient.   #Iron deficiency anemia Patient has an iron deficit of around 800 mg on admission.  Patient got IV Ferrlecit during hospitalization and was transition to oral iron every other day.  Continue oral ferrous sulfate 325 mg every other day.    #Mild leukopenia Patient's white blood cell count at 3.5.  Unclear etiology.  Continue to workup outpatient.   #Hyperlipidemia Patient continued on atorvastatin 40 mg daily and aspirin 81 mg daily during hospitalization.   Discharge Subjective:  Patient states that she is doing well.  She has no concerns at this time.  She states that she has been up and moving with no concerns today.  She states that she is doing fine.  She denies any chest pain or shortness of breath.  Patient is ready to go home.  Discharge Exam:   BP 107/64 (BP Location: Right Arm)   Pulse 75   Temp (!) 97.5 F (36.4 C) (Oral)   Resp 16   Ht '5\' 9"'$  (1.753 m)   Wt (!) 142.5 kg   SpO2 92%   BMI 46.40 kg/m  Constitutional: Patient sitting in bed in no acute distress HENT: normocephalic atraumatic, mucous membranes moist Eyes: conjunctiva non-erythematous Neck: JVD noted up to earlobe Cardiovascular: regular rate and rhythm, no m/r/g Pulmonary/Chest: normal work of breathing on room air, lungs clear to auscultation bilaterally Abdominal: soft, non-tender, non-distended MSK: normal bulk and tone Neurological: alert & oriented x 3 Skin: warm and dry Extremities: 2+ pitting edema to bilateral lower extremities  Pertinent Labs, Studies, and Procedures:     Latest Ref Rng & Units 02/27/2022   12:56 AM 02/25/2022   12:50 AM 02/24/2022   12:48 AM  CBC  WBC 4.0 - 10.5 K/uL 3.5  2.9  3.1   Hemoglobin 12.0 - 15.0 g/dL 13.0  12.4  12.3   Hematocrit 36.0 - 46.0 % 44.3  43.2  43.4   Platelets 150 - 400 K/uL 196  211  218        Latest Ref Rng & Units  02/27/2022   12:56 AM 02/26/2022   12:43 AM 02/25/2022   12:50 AM  CMP  Glucose 70 - 99 mg/dL 153  132  154   BUN 8 - 23 mg/dL '21  19  17   '$ Creatinine 0.44 - 1.00 mg/dL 1.69  1.46  1.40   Sodium 135 - 145 mmol/L 136  137  140   Potassium  3.5 - 5.1 mmol/L 3.6  3.7  3.8   Chloride 98 - 111 mmol/L 101  100  101   CO2 22 - 32 mmol/L '26  28  27   '$ Calcium 8.9 - 10.3 mg/dL 9.0  8.9  9.3     No results found.   Discharge Instructions:  Tina Lara,   It was a pleasure taking care of you at [facility] were admitted for heart failure and treated with IV fluid medication to get fluid off of you. We are discharging you home now that you are doing better. Please follow the following instructions.   1) Regarding your heart failure, we have sent you home on new medications.  Please take these as directed.  These new medications include Lisabeth Register, spironolactone, Coreg.  We have also sent you home on torsemide which is your water pill.  Please take all your medications as prescribed.  Remember to ask for refills.  2) Please follow-up with the internal medicine center on Wednesday, March 07, 2022 at 8:15 AM with Dr. Philipp Ovens   3) Remember to ask about refills for your medications, and maintaining good communication line with your primary care physician to ensure you get the appropriate refills.  4) You do have a low white blood cell count, please talk to your primary care doctor about possible hematology referral  5) Regarding your possible liver cirrhosis, please continue and move forward with your liver elastography.  6) You have an appointment with the [facility] on 03/09/2022 at 9 AM.  Please show up  7) You have an appointment with Dr. Phineas Inches on 03/21/2022 at 9 AM.  Please show up.  Take care,  Dr. Leigh Aurora, DO    Discharge Instructions     (HEART FAILURE PATIENTS) Call MD:  Anytime you have any of the following symptoms: 1) 3 pound weight  gain in 24 hours or 5 pounds in 1 week 2) shortness of breath, with or without a dry hacking cough 3) swelling in the hands, feet or stomach 4) if you have to sleep on extra pillows at night in order to breathe.   Complete by: As directed    Call MD for:  persistant nausea and vomiting   Complete by: As directed    Call MD for:  redness, tenderness, or signs of infection (pain, swelling, redness, odor or green/yellow discharge around incision site)   Complete by: As directed    Call MD for:  severe uncontrolled pain   Complete by: As directed    Call MD for:  temperature >100.4   Complete by: As directed    Diet - low sodium heart healthy   Complete by: As directed    Face-to-face encounter (required for Medicare/Medicaid patients)   Complete by: As directed    I Idamae Schuller certify that this patient is under my care and that I, or a nurse practitioner or physician's assistant working with me, had a face-to-face encounter that meets the physician face-to-face encounter requirements with this patient on 02/27/2022. The encounter with the patient was in whole, or in part for the following medical condition(s) which is the primary reason for home health care (List medical condition): Deconditioning.   The encounter with the patient was in whole, or in part, for the following medical condition, which is the primary reason for home health care: deconditioning   I certify that, based on my findings, the following services are medically necessary home  health services: Physical therapy   Reason for Medically Necessary Home Health Services: Skilled Nursing- Skilled Assessment/Observation   My clinical findings support the need for the above services: Unsafe ambulation due to balance issues   Further, I certify that my clinical findings support that this patient is homebound due to: Ambulates short distances less than 300 feet   For home use only DME 4 wheeled rolling walker with seat   Complete by: As  directed    Patient needs a walker to treat with the following condition: Weakness   Home Health   Complete by: As directed    To provide the following care/treatments: PT   Increase activity slowly   Complete by: As directed        Signed: Leigh Aurora, DO 02/27/2022, 1:42 PM   Pager: [phone]

## 2022-02-27 NOTE — Plan of Care (Signed)
  Problem: Education: Goal: Knowledge of General Education information will improve Description: Including pain rating scale, medication(s)/side effects and non-pharmacologic comfort measures Outcome: Adequate for Discharge   Problem: Health Behavior/Discharge Planning: Goal: Ability to manage health-related needs will improve Outcome: Adequate for Discharge   Problem: Clinical Measurements: Goal: Ability to maintain clinical measurements within normal limits will improve Outcome: Adequate for Discharge Goal: Diagnostic test results will improve Outcome: Adequate for Discharge Goal: Cardiovascular complication will be avoided Outcome: Adequate for Discharge   Problem: Coping: Goal: Level of anxiety will decrease Outcome: Adequate for Discharge   Problem: Education: Goal: Ability to describe self-care measures that may prevent or decrease complications (Diabetes Survival Skills Education) will improve Outcome: Adequate for Discharge Goal: Individualized Educational Video(s) Outcome: Adequate for Discharge   Problem: Coping: Goal: Ability to adjust to condition or change in health will improve Outcome: Adequate for Discharge   Problem: Fluid Volume: Goal: Ability to maintain a balanced intake and output will improve Outcome: Adequate for Discharge   Problem: Health Behavior/Discharge Planning: Goal: Ability to identify and utilize available resources and services will improve Outcome: Adequate for Discharge Goal: Ability to manage health-related needs will improve Outcome: Adequate for Discharge   Problem: Metabolic: Goal: Ability to maintain appropriate glucose levels will improve Outcome: Adequate for Discharge   Problem: Nutritional: Goal: Maintenance of adequate nutrition will improve Outcome: Adequate for Discharge Goal: Progress toward achieving an optimal weight will improve Outcome: Adequate for Discharge   Problem: Tissue Perfusion: Goal: Adequacy of tissue  perfusion will improve Outcome: Adequate for Discharge   Problem: Education: Goal: Understanding of CV disease, CV risk reduction, and recovery process will improve Outcome: Adequate for Discharge Goal: Individualized Educational Video(s) Outcome: Adequate for Discharge   Problem: Activity: Goal: Ability to return to baseline activity level will improve Outcome: Adequate for Discharge   Problem: Cardiovascular: Goal: Ability to achieve and maintain adequate cardiovascular perfusion will improve Outcome: Adequate for Discharge Goal: Vascular access site(s) Level 0-1 will be maintained Outcome: Adequate for Discharge   Problem: Health Behavior/Discharge Planning: Goal: Ability to safely manage health-related needs after discharge will improve Outcome: Adequate for Discharge

## 2022-02-27 NOTE — Progress Notes (Signed)
Cardiology Progress Note  Patient ID: Tina Lara MRN: [id number] DOB: Dec 21, 1955 Date of Encounter: 02/27/2022  Primary Cardiologist: Janina Mayo, MD  Subjective   Chief Complaint: None.   HPI: Good urine output.  Volume status improved.  Transition to oral diuretics today.  ROS:  All other ROS reviewed and negative. Pertinent positives noted in the HPI.     Inpatient Medications  Scheduled Meds:  aspirin EC  81 mg Oral Daily   atorvastatin  40 mg Oral QHS   bisacodyl  5 mg Oral Daily   carvedilol  6.25 mg Oral BID WC   dapagliflozin propanediol  10 mg Oral Daily   enoxaparin (LOVENOX) injection  70 mg Subcutaneous Q24H   ferrous sulfate  325 mg Oral QODAY   insulin aspart  0-5 Units Subcutaneous QHS   insulin aspart  0-9 Units Subcutaneous TID WC   pantoprazole  40 mg Oral Daily   sacubitril-valsartan  1 tablet Oral BID   senna-docusate  1 tablet Oral Daily   spironolactone  12.5 mg Oral Daily   Continuous Infusions:  PRN Meds: acetaminophen, bisacodyl, ondansetron (ZOFRAN) IV   Vital Signs   Vitals:   02/26/22 1750 02/26/22 1947 02/27/22 0100 02/27/22 0630  BP: 114/67 122/69  107/64  Pulse: 79 90  75  Resp:  18  16  Temp:  98 F (36.7 C)  (!) 97.5 F (36.4 C)  TempSrc:    Oral  SpO2:  96%  92%  Weight:   (!) 142.5 kg   Height:        Intake/Output Summary (Last 24 hours) at 02/27/2022 0911 Last data filed at 02/27/2022 0500 Gross per 24 hour  Intake 240 ml  Output 4800 ml  Net -4560 ml      02/27/2022    1:00 AM 02/26/2022    5:44 AM 02/25/2022    4:38 AM  Last 3 Weights  Weight (lbs) 314 lb 3.2 oz 318 lb 321 lb  Weight (kg) 142.52 kg 144.244 kg 145.605 kg      Telemetry  Overnight telemetry shows SR 70s, which I personally reviewed.    Physical Exam   Vitals:   02/26/22 1750 02/26/22 1947 02/27/22 0100 02/27/22 0630  BP: 114/67 122/69  107/64  Pulse: 79 90  75  Resp:  18  16  Temp:  98 F (36.7 C)  (!) 97.5 F (36.4 C)   TempSrc:    Oral  SpO2:  96%  92%  Weight:   (!) 142.5 kg   Height:        Intake/Output Summary (Last 24 hours) at 02/27/2022 0911 Last data filed at 02/27/2022 0500 Gross per 24 hour  Intake 240 ml  Output 4800 ml  Net -4560 ml       02/27/2022    1:00 AM 02/26/2022    5:44 AM 02/25/2022    4:38 AM  Last 3 Weights  Weight (lbs) 314 lb 3.2 oz 318 lb 321 lb  Weight (kg) 142.52 kg 144.244 kg 145.605 kg    Body mass index is 46.4 kg/m.  General: Well nourished, well developed, in no acute distress Head: Atraumatic, normal size  Eyes: PEERLA, EOMI  Neck: Supple, no JVD Endocrine: No thryomegaly Cardiac: Normal S1, S2; RRR; no murmurs, rubs, or gallops Lungs: Clear to auscultation bilaterally, no wheezing, rhonchi or rales  Abd: Soft, nontender, no hepatomegaly  Ext: No edema, pulses 2+ Musculoskeletal: No deformities, BUE and BLE strength normal and equal Skin:  Warm and dry, no rashes   Neuro: Alert and oriented to person, place, time, and situation, CNII-XII grossly intact, no focal deficits  Psych: Normal mood and affect   Labs  High Sensitivity Troponin:  No results for input(s): "TROPONINIHS" in the last 720 hours.   Cardiac EnzymesNo results for input(s): "TROPONINI" in the last 168 hours. No results for input(s): "TROPIPOC" in the last 168 hours.  Chemistry Recent Labs  Lab 02/21/22 1743 02/22/22 0027 02/23/22 0035 02/23/22 0858 02/25/22 0050 02/26/22 0043 02/27/22 0056  NA  --  139 141   < > 140 137 136  K  --  3.5 3.3*   < > 3.8 3.7 3.6  CL  --  100 99   < > 101 100 101  CO2  --  29 29   < > '27 28 26  '$ GLUCOSE  --  124* 131*   < > 154* 132* 153*  BUN  --  24* 22   < > '17 19 21  '$ CREATININE  --  1.53* 1.66*   < > 1.40* 1.46* 1.69*  CALCIUM  --  8.9 9.1   < > 9.3 8.9 9.0  PROT 8.7* 7.4 8.0  --   --   --   --   ALBUMIN 3.6 3.0* 3.3*  --   --   --   --   AST '27 21 23  '$ --   --   --   --   ALT '17 18 18  '$ --   --   --   --   ALKPHOS 113 90 99  --   --   --    --   BILITOT 1.6* 1.1 1.0  --   --   --   --   GFRNONAA  --  38* 34*   < > 42* 40* 34*  ANIONGAP  --  10 13   < > '12 9 9   '$ < > = values in this interval not displayed.    Hematology Recent Labs  Lab 02/24/22 0048 02/25/22 0050 02/27/22 0056  WBC 3.1* 2.9* 3.5*  RBC 5.23* 5.25* 5.36*  HGB 12.3 12.4 13.0  HCT 43.4 43.2 44.3  MCV 83.0 82.3 82.6  MCH 23.5* 23.6* 24.3*  MCHC 28.3* 28.7* 29.3*  RDW 17.4* 17.0* 17.2*  PLT 218 211 196   BNP Recent Labs  Lab 02/20/22 1008  BNP 1,423.2*    DDimer No results for input(s): "DDIMER" in the last 168 hours.   Radiology  No results found.  Cardiac Studies  TTE 02/07/2022  1. Left ventricular ejection fraction, by estimation, is 25 to 30%. The  left ventricle has severely decreased function. The left ventricle  demonstrates global hypokinesis. There is mild asymmetric left ventricular  hypertrophy of the inferior segment.  Left ventricular diastolic parameters are consistent with Grade III  diastolic dysfunction (restrictive). Elevated left ventricular  end-diastolic pressure.   2. Right ventricular systolic function is moderately reduced. The right  ventricular size is mildly enlarged. There is mildly elevated pulmonary  artery systolic pressure.   3. The mitral valve is normal in structure. No evidence of mitral valve  regurgitation. No evidence of mitral stenosis.   4. Tricuspid valve regurgitation is moderate to severe.   5. The aortic valve is normal in structure. Aortic valve regurgitation is  mild. Aortic valve sclerosis/calcification is present, without any  evidence of aortic stenosis.   6. There is mild dilatation of the ascending aorta, measuring  37 mm.   7. The inferior vena cava is normal in size with greater than 50%  respiratory variability, suggesting right atrial pressure of 3 mmHg.   LHC/RHC  CONCLUSIONS: Left heart failure with high cardiac output 9.46 L/min with LVEDP 21 mmHg. Mild pulmonary hypertension  with mean PA pressure 34 mmHg.  Pulmonary capillary wedge pressure mean 23 mmHg; pulmonary vascular resistance 1.16 [place] units.  WHO group 2 pulmonary hypertension etiology. Left dominant coronary anatomy.  No obstructive disease.  Mid LAD 40 to 50%.  Patient Profile  Tina Lara is a 66 y.o. female with cirrhosis, nonobstructive CAD, morbid obesity, diabetes who was admitted on 63/04/6008 for acute systolic heart failure.   Assessment & Plan   #Acute systolic heart failure, EF 25-30% #High-output heart failure #Cirrhosis #Nonischemic cardiomyopathy -Overall concerns for high-output heart failure in the setting of cirrhosis and morbid obesity. -Euvolemic on exam.  Net -4.4 L overnight.  Net -20.6 L since admission.  Transition to torsemide 20 mg daily. -Slight rise in creatinine but stable.  Continue Farxiga 10 mg daily, carvedilol 6.25 mg twice daily, Entresto 24-26 mg twice daily, [name] 12.5 mg daily. -Repeat echo is pending today.  Suspect this will show much improved filling pressures.  Anticipate discharge today.  #Nonobstructive CAD -Aspirin and statin  #Cirrhosis -Diuretics as above.  #CKD IIIB -Stable  [facility] will sign off.   Medication Recommendations: Medications as above Other recommendations (labs, testing, etc): None Follow up as an outpatient: We will arrange a 1 week heart failure follow-up appointment.  She will follow with Dr. Phineas Inches as well.  For questions or updates, please contact [place] Please consult [email or web address] for contact info under   Signed, [place] T. Audie Box, MD, [place]  02/27/2022 9:11 AM

## 2022-02-27 NOTE — Discharge Instructions (Addendum)
Ms. Tina Lara,   It was a pleasure taking care of you at [place] were admitted for heart failure and treated with IV fluid medication to get fluid off of you. We are discharging you home now that you are doing better. Please follow the following instructions.   1) Regarding your heart failure, we have sent you home on new medications.  Please take these as directed.  These new medications include Lisabeth Register, spironolactone, Coreg.  We have also sent you home on torsemide which is your water pill.  Please take all your medications as prescribed.  Remember to ask for refills.  2) Please follow-up with the internal medicine center on Wednesday, March 07, 2022 at 8:15 AM with Dr. Philipp Ovens   3) Remember to ask about refills for your medications, and maintaining good communication line with your primary care physician to ensure you get the appropriate refills.  4) You do have a low white blood cell count, please talk to your primary care doctor about possible hematology referral  5) Regarding your possible liver cirrhosis, please continue and move forward with your liver elastography.  6) You have an appointment with [facility] on 03/09/2022 at 9 AM.  Please show up  7) You have an appointment with Dr. Phineas Inches on 03/21/2022 at 9 AM.  Please show up.  Take care,  Dr. Leigh Aurora, DO

## 2022-02-27 NOTE — Progress Notes (Signed)
   02/27/22 1000  Mobility  Activity Ambulated with assistance in hallway  Level of Assistance Standby assist, set-up cues, supervision of patient - no hands on  Assistive Device Four wheel walker  Distance Ambulated (ft) 70 ft  Activity Response Tolerated well  Mobility Referral Yes  $Mobility charge 1 Mobility   Mobility Specialist Progress Note  Pt was in bed and agreeable. Had no c/o pain throughout mobility. Returned to bed w/all needs met and call bell in reach.   Tina Lara Mobility Specialist

## 2022-02-27 NOTE — Progress Notes (Signed)
Echocardiogram 2D Echocardiogram has been performed.  Tina Lara 02/27/2022, 12:08 PM

## 2022-02-28 ENCOUNTER — Telehealth: Payer: Self-pay

## 2022-02-28 NOTE — Patient Outreach (Signed)
  Care Coordination [facility] Note Transition Care Management Unsuccessful Follow-up Telephone Call  Date of discharge and from where:  [facility] 02[facility]-02/27/22  Attempts:  Left voice message  Reason for unsuccessful TCM follow-up call:  Left voice message  Johnney Killian, RN, BSN, CCM Care Management Coordinator [facility] Phone: [phone]: [phone]

## 2022-02-28 NOTE — Patient Outreach (Signed)
  Care Coordination [facility] Note Transition Care Management Unsuccessful Follow-up Telephone Call  Date of discharge and from where:  [facility]22-02/27/22  Attempts:  2nd Attempt  Reason for unsuccessful TCM follow-up call:  No answer/busy  Johnney Killian, RN, BSN, CCM Care Management Coordinator [facility] Phone: [phone]: [phone]

## 2022-03-01 ENCOUNTER — Telehealth: Payer: Self-pay

## 2022-03-01 NOTE — Patient Outreach (Signed)
  Care Coordination   Collaboration  Visit Note   03/01/2022 Name: Tina Lara MRN: [id number] DOB: 1955-07-20  Tina Lara is a 66 y.o. year old female who sees Tina Ochs, MD for primary care. I  collaborated with Dr. Philipp Lara and discussed patients Tina Lara and her inability to break tab in half.  Dr. Philipp Lara advised for patient to continue taking a whole tab and she will address at office visit on 03/07/22.  What matters to the patients health and wellness today?  Take my medications as ordered.    Goals Addressed               This Visit's Progress     COMPLETED: "I have been taking a whole tablet of Tina Lara '25mg'$  instead of the correct dose of 12.5" (pt-stated)        Care Coordination Interventions: Evaluation of current treatment plan related to Tina Lara dose and patient's adherence to plan as established by provider Reviewed medications with patient and discussed Dr. Philipp Lara saying patient can continue to take a full tablet.   Collaborated with Dr. Philipp Lara regarding patients issue with being unable to break her Tina Lara in half.  Per Dr. Philipp Lara, patient may take a whole tab and she will draw labs when she sees the patient next week 12//6/23 Discussed plans with patient for ongoing care management follow up and provided patient with direct contact information for care management team          SDOH assessments and interventions completed:  Yes  SDOH Interventions Today    Flowsheet Row Most Recent Value  SDOH Interventions   Housing Interventions Intervention Not Indicated  Transportation Interventions Intervention Not Indicated        Care Coordination Interventions:  Yes, provided   Follow up plan: Follow up call scheduled for December 13 at 1:00pm with Tina Lara.    Encounter Outcome:  Pt. Visit Completed

## 2022-03-01 NOTE — Patient Outreach (Signed)
  Care Coordination TOC Note Transition Care Management Follow-up Telephone Call Date of discharge and from where: Tina Lara 02/21/22-02/27/22 How have you been since you were released from the hospital? "I am feeling pretty good" Any questions or concerns? Yes- I thought I was suppose to take a whole [name] tab and I realized today I am only suppose to take 1/2 tab.  When I try to break the pill it crumbles in half.   I do not have anyone to break the pill for me or a pill cutter.  Should she skip today or take a whole tab?  This Probation officer sent message to PCP to inquire what the patient should do about her medication.  Items Reviewed: Did the pt receive and understand the discharge instructions provided? Yes  Medications obtained and verified? Yes  Other? No  Any new allergies since your discharge? No  Dietary orders reviewed? No Do you have support at home? Yes   Home Care and Equipment/Supplies: Were home health services ordered? yes If so, what is the name of the agency? [facility]  Has the agency set up a time to come to the patient's home? Yes- Tina Lara and verified they are going to see patient 03/02/22 Were any new equipment or medical supplies ordered?  No What is the name of the medical supply agency? Obtained walker in acute hospital Were you able to get the supplies/equipment? yes Do you have any questions related to the use of the equipment or supplies? No  Functional Questionnaire: (I = Independent and D = Dependent) ADLs: I  Bathing/Dressing- I  Meal Prep- I  Eating- I  Maintaining continence- I  Transferring/Ambulation- I  Managing Meds- I  Follow up appointments reviewed:  PCP Hospital f/u appt confirmed? Yes  Scheduled to see Dr. Philipp Lara on 03/07/22 @ 0815.Marland Kitchen [facility] f/u appt confirmed? No   Are transportation arrangements needed? No  If their condition worsens, is the pt aware to call PCP or go to the Emergency Dept.? Yes Was the patient  provided with contact information for the PCP's office or ED? Yes Was to pt encouraged to call back with questions or concerns? Yes  SDOH assessments and interventions completed:   Yes SDOH Interventions Today    Flowsheet Row Most Recent Value  SDOH Interventions   Housing Interventions Intervention Not Indicated  Transportation Interventions Intervention Not Indicated       Care Coordination Interventions:  No Care Coordination interventions needed at this time. Follow up with PCP and contact patient about her medication    Encounter Outcome:  Pt. Visit Completed

## 2022-03-07 ENCOUNTER — Other Ambulatory Visit: Payer: Self-pay

## 2022-03-07 ENCOUNTER — Encounter: Payer: Medicare Other | Admitting: Dietician

## 2022-03-07 ENCOUNTER — Encounter: Payer: Self-pay | Admitting: Internal Medicine

## 2022-03-07 ENCOUNTER — Ambulatory Visit (INDEPENDENT_AMBULATORY_CARE_PROVIDER_SITE_OTHER): Payer: Medicare Other | Admitting: Internal Medicine

## 2022-03-07 VITALS — BP 132/65 | HR 81 | Temp 97.9°F | Ht 69.0 in | Wt 310.3 lb

## 2022-03-07 DIAGNOSIS — D709 Neutropenia, unspecified: Secondary | ICD-10-CM

## 2022-03-07 DIAGNOSIS — D890 Polyclonal hypergammaglobulinemia: Secondary | ICD-10-CM | POA: Diagnosis not present

## 2022-03-07 DIAGNOSIS — I130 Hypertensive heart and chronic kidney disease with heart failure and stage 1 through stage 4 chronic kidney disease, or unspecified chronic kidney disease: Secondary | ICD-10-CM

## 2022-03-07 DIAGNOSIS — I5043 Acute on chronic combined systolic (congestive) and diastolic (congestive) heart failure: Secondary | ICD-10-CM

## 2022-03-07 DIAGNOSIS — Z87891 Personal history of nicotine dependence: Secondary | ICD-10-CM | POA: Diagnosis not present

## 2022-03-07 DIAGNOSIS — E119 Type 2 diabetes mellitus without complications: Secondary | ICD-10-CM | POA: Diagnosis not present

## 2022-03-07 DIAGNOSIS — N1832 Chronic kidney disease, stage 3b: Secondary | ICD-10-CM | POA: Diagnosis not present

## 2022-03-07 DIAGNOSIS — D509 Iron deficiency anemia, unspecified: Secondary | ICD-10-CM | POA: Diagnosis not present

## 2022-03-07 DIAGNOSIS — K7469 Other cirrhosis of liver: Secondary | ICD-10-CM

## 2022-03-07 DIAGNOSIS — K5900 Constipation, unspecified: Secondary | ICD-10-CM | POA: Diagnosis not present

## 2022-03-07 DIAGNOSIS — Z7984 Long term (current) use of oral hypoglycemic drugs: Secondary | ICD-10-CM

## 2022-03-07 DIAGNOSIS — N183 Chronic kidney disease, stage 3 unspecified: Secondary | ICD-10-CM | POA: Insufficient documentation

## 2022-03-07 DIAGNOSIS — I5022 Chronic systolic (congestive) heart failure: Secondary | ICD-10-CM | POA: Diagnosis not present

## 2022-03-07 DIAGNOSIS — I10 Essential (primary) hypertension: Secondary | ICD-10-CM

## 2022-03-07 MED ORDER — TORSEMIDE 20 MG PO TABS: 40.0000 mg | ORAL_TABLET | Freq: Every day | ORAL | 0 refills | Status: DC

## 2022-03-07 MED ORDER — ASPIRIN 81 MG PO TBEC: 81.0000 mg | DELAYED_RELEASE_TABLET | Freq: Every day | ORAL | 3 refills | Status: AC

## 2022-03-07 MED ORDER — SPIRONOLACTONE 25 MG PO TABS: 25.0000 mg | ORAL_TABLET | Freq: Every day | ORAL | 0 refills | Status: DC

## 2022-03-07 NOTE — Assessment & Plan Note (Signed)
Checking CMP.  

## 2022-03-07 NOTE — Patient Instructions (Addendum)
Tina Lara,  It was a pleasure to see you. Please continue to take all of your medicines as prescribed. You may restarted your ozempic (semaglutide) for your diabetes. I have placed a referral for you to be seen by a hematologist, you will be called to schedule this.   Please follow up with me again in 3 months or sooner if you have any problems.   If you have any questions or concerns, call our clinic at [phone] or after hours call [phone] and ask for the internal medicine resident on call.   Thank you!  Dr. Darnell Level

## 2022-03-07 NOTE — Assessment & Plan Note (Signed)
Feeling well since her recent discharge, weight is down to 310 lbs from 366 lbs on her initial presentation to me for decompensated HF. She is currently taking torsemide 40 mg daily, entresto 24-26 mg BID, coreg 6.25 mg BID, spironolactone 25 mg daily, and farxiga 10 mg daily. Appears euvolemic on exam. She underwent R&LHC during her hospitalization which showed high output left heart failure with cardiac output of 9.46 L/min and LVEDP 21 mmHg. There was some concern for infiltrative CM on initial echocardiogram. SPEP / IFE showed an elevated IgG however it was polyclonal and no M-spike. Free light chains were mildly elevated but ratio was low. Given her high output HF will refer to heme and have them weight in. Cardiology (Dr. Harl Bowie) and I have discussed this plan.

## 2022-03-07 NOTE — Assessment & Plan Note (Signed)
Chronic stable. Heme referral.

## 2022-03-07 NOTE — Assessment & Plan Note (Signed)
See HFrEF A&P. I suspect this is due to her possible cirrhosis and chronic liver disease, however I have referred for hematology input given her high output HF. Appreciate assistance.

## 2022-03-07 NOTE — Addendum Note (Signed)
Addended by: Jodean Lima on: 03/07/2022 11:04 AM   Modules accepted: Orders

## 2022-03-07 NOTE — Assessment & Plan Note (Signed)
Currently taking ferosul every other day. Most recent hemoglobin was WNL. Recheck iron studies in 3 months.

## 2022-03-07 NOTE — Progress Notes (Signed)
Subjective:   Patient ID: [name] female   DOB: 1955/10/16 66 y.o.   MRN: [id number]  HPI: Tina Lara is a 66 y.o. female with past medical history outlined below here for follow up of heart failure. For the details of today's visit, please refer to the assessment and plan below.   Past Medical History:  Diagnosis Date   Anxiety    Cataract    NS OD   Chest pain 96/78/9381   Complication of anesthesia    Per patient "needs a lot of anesthesia w/surgery" ,I had toruble with a spinal block not taking "   Diabetes mellitus without complication (HCC)    Type 2   Diabetic retinopathy ([place])    BDR   Full dentures    GERD (gastroesophageal reflux disease)    Heart failure ([place])    History of surgery on arm    right arm broken   Hyperlipidemia    Hypertension    never diagnosed by physician per pt   Macular degeneration    Obesity    Current Outpatient Medications  Medication Sig Dispense Refill   acetaminophen (TYLENOL) 500 MG tablet Take 1,500 mg by mouth every 6 (six) hours as needed for moderate pain or headache.      aspirin EC 81 MG tablet Take 1 tablet (81 mg total) by mouth daily. 90 tablet 3   atorvastatin (LIPITOR) 40 MG tablet Take 1 tablet (40 mg total) by mouth daily. 90 tablet 3   carvedilol (COREG) 6.25 MG tablet Take 1 tablet (6.25 mg total) by mouth 2 (two) times daily with a meal. 60 tablet 0   dapagliflozin propanediol (FARXIGA) 10 MG TABS tablet Take 1 tablet (10 mg total) by mouth daily before breakfast. 30 tablet 0   dicyclomine (BENTYL) 20 MG tablet Take 1 tablet (20 mg total) by mouth 2 (two) times daily as needed for spasms (abdominal pain). 20 tablet 0   ferrous sulfate 325 (65 FE) MG tablet Take 1 tablet (325 mg total) by mouth every other day. 15 tablet 3   Hypromellose (ARTIFICIAL TEARS OP) Place 1 drop into both eyes daily as needed (for dry eyes).     ibuprofen (ADVIL,MOTRIN) 200 MG tablet Take 400 mg by mouth every 6 (six) hours as  needed for headache or moderate pain.  (Patient not taking: Reported on 02/21/2022)     lactulose (CHRONULAC) 10 GM/15ML solution Take 15 mLs (10 g total) by mouth 2 (two) times daily as needed for mild constipation. 236 mL 0   Menthol, Topical Analgesic, (BIOFREEZE EX) Apply 1 application  topically daily as needed (back pain).     pantoprazole (PROTONIX) 40 MG tablet Take 1 tablet (40 mg total) by mouth daily. 30 tablet 2   sacubitril-valsartan (ENTRESTO) 24-26 MG Take 1 tablet by mouth 2 (two) times daily. 60 tablet 1   Semaglutide,0.25 or 0.'5MG'$ /DOS, 2 MG/3ML SOPN Inject 0.25 mg into the skin once a week. 3 mL 2   spironolactone ([name]) 25 MG tablet Take 1 tablet (25 mg total) by mouth daily. 30 tablet 0   torsemide (DEMADEX) 20 MG tablet Take 2 tablets (40 mg total) by mouth daily. 30 tablet 0   No current facility-administered medications for this visit.   Family History  Problem Relation Age of Onset   Kidney disease Mother    Heart disease Mother    Diabetes Mother    Seizures Father    Heart disease Father  Other Father    Breast cancer Neg Hx    Social History   Socioeconomic History   Marital status: Divorced    Spouse name: Not on file   Number of children: 1   Years of education: Not on file   Highest education level: High school graduate  Occupational History   Occupation: disability  Tobacco Use   Smoking status: Former    Packs/day: 0.25    Years: 6.00    Total pack years: 1.50    Types: Cigarettes   Smokeless tobacco: Never   Tobacco comments:    quit 36 yrs ago  Vaping Use   Vaping Use: Never used  Substance and Sexual Activity   Alcohol use: Yes    Comment: occasionally   Drug use: No   Sexual activity: Not Currently    Birth control/protection: None  Other Topics Concern   Not on file  Social History Narrative   Not on file   Social Determinants of Health   Financial Resource Strain: High Risk (02/26/2022)   Overall Financial Resource  Strain (CARDIA)    Difficulty of Paying Living Expenses: Very hard  Food Insecurity: No Food Insecurity (02/21/2022)   Hunger Vital Sign    Worried About Running Out of Food in the Last Year: Never true    Ran Out of Food in the Last Year: Never true  Recent Concern: [place] Present (02/14/2022)   Hunger Vital Sign    Worried About [place] in the Last Year: Sometimes true    Ran Out of Food in the Last Year: Sometimes true  Transportation Needs: No Transportation Needs (03/01/2022)   PRAPARE - Hydrologist (Medical): No    Lack of Transportation (Non-Medical): No  Physical Activity: Inactive (02/14/2022)   Exercise Vital Sign    Days of Exercise per Week: 0 days    Minutes of Exercise per Session: 0 min  Stress: No Stress Concern Present (02/14/2022)   [place]    Feeling of Stress : Not at all  Social Connections: Moderately Isolated (02/14/2022)   Social Connection and Isolation Panel [NHANES]    Frequency of Communication with Friends and Family: Twice a week    Frequency of Social Gatherings with Friends and Family: Once a week    Attends Religious Services: More than 4 times per year    Active Member of [employer] or Organizations: No    Attends Archivist Meetings: Never    Marital Status: Divorced     Objective:  Physical Exam:  Vitals:   03/07/22 0821  BP: 132/65  Pulse: 81  Temp: 97.9 F (36.6 C)  TempSrc: Oral  SpO2: 100%  Weight: (!) 310 lb 4.8 oz (140.8 kg)  Height: '5\' 9"'$  (1.753 m)    Constitutional: Obese, NAD Cardiovascular: RRR, no m/r/g Pulmonary/Chest: Distant breath sounds, no respiratory distress  Extremities: trace edema  Psychiatric: Normal mood and affect  Assessment & Plan:   Chronic HFrEF (heart failure with reduced ejection fraction) ([place]) Feeling well since her recent discharge, weight is down to  310 lbs from 366 lbs on her initial presentation to me for decompensated HF. She is currently taking torsemide 40 mg daily, entresto 24-26 mg BID, coreg 6.25 mg BID, spironolactone 25 mg daily, and farxiga 10 mg daily. Appears euvolemic on exam. She underwent R&LHC during her hospitalization which showed high output left heart failure  with cardiac output of 9.46 L/min and LVEDP 21 mmHg. There was some concern for infiltrative CM on initial echocardiogram. SPEP / IFE showed an elevated IgG however it was polyclonal and no M-spike. Free light chains were mildly elevated but ratio was low. Given her high output HF will refer to heme and have them weight in. Cardiology (Dr. Harl Bowie) and I have discussed this plan.   Polyclonal hypergammaglobulinemia See HFrEF A&P. I suspect this is due to her possible cirrhosis and chronic liver disease, however I have referred for hematology input given her high output HF. Appreciate assistance.   Leukopenia Chronic stable. Heme referral.   Iron deficiency anemia Currently taking ferosul every other day. Most recent hemoglobin was WNL. Recheck iron studies in 3 months.   Cirrhosis ([place]) Patient with suspected cirrhosis. NASH vs. Cardiac cirrhosis. Recently found to have decompensated high output HF; 9L/min. This can be seen with chronic liver disease. Elastography is pending. She has already been referred for GI evaluation. Recent hep C screening Ab was non reactive. Will check Hep B serologies.   CKD (chronic kidney disease), stage III (HCC) Checking CMP.

## 2022-03-07 NOTE — Assessment & Plan Note (Signed)
Patient with suspected cirrhosis. NASH vs. Cardiac cirrhosis. Recently found to have decompensated high output HF; 9L/min. This can be seen with chronic liver disease. Elastography is pending. She has already been referred for GI evaluation. Recent hep C screening Ab was non reactive. Will check Hep B serologies.

## 2022-03-08 LAB — CMP14 + ANION GAP
ALT: 34 IU/L — ABNORMAL HIGH (ref 0–32)
AST: 38 IU/L (ref 0–40)
Albumin/Globulin Ratio: 0.9 — ABNORMAL LOW (ref 1.2–2.2)
Albumin: 4.1 g/dL (ref 3.9–4.9)
Alkaline Phosphatase: 133 IU/L — ABNORMAL HIGH (ref 44–121)
Anion Gap: 17 mmol/L (ref 10.0–18.0)
BUN/Creatinine Ratio: 24 (ref 12–28)
BUN: 43 mg/dL — ABNORMAL HIGH (ref 8–27)
Bilirubin Total: 0.5 mg/dL (ref 0.0–1.2)
CO2: 23 mmol/L (ref 20–29)
Calcium: 9.6 mg/dL (ref 8.7–10.3)
Chloride: 96 mmol/L (ref 96–106)
Creatinine, Ser: 1.77 mg/dL — ABNORMAL HIGH (ref 0.57–1.00)
Globulin, Total: 4.5 g/dL (ref 1.5–4.5)
Glucose: 174 mg/dL — ABNORMAL HIGH (ref 70–99)
Potassium: 4.3 mmol/L (ref 3.5–5.2)
Sodium: 136 mmol/L (ref 134–144)
Total Protein: 8.6 g/dL — ABNORMAL HIGH (ref 6.0–8.5)
eGFR: 32 mL/min/{1.73_m2} — ABNORMAL LOW (ref >59)

## 2022-03-08 LAB — HEPATITIS B CORE ANTIBODY, TOTAL: Hep B Core Total Ab: NEGATIVE

## 2022-03-08 LAB — HEPATITIS B SURFACE ANTIGEN: Hepatitis B Surface Ag: NEGATIVE

## 2022-03-08 LAB — HEPATITIS B SURFACE ANTIBODY,QUALITATIVE: Hep B Surface Ab, Qual: NONREACTIVE

## 2022-03-08 NOTE — Telephone Encounter (Signed)
Call attempted to confirm HV TOC appt 03/09/22 @ 9 am. . HIPPA appropriate VM left with callback number.    Earnestine Leys, BSN, Clinical cytogeneticist Only

## 2022-03-09 ENCOUNTER — Ambulatory Visit (HOSPITAL_COMMUNITY)
Admit: 2022-03-09 | Discharge: 2022-03-09 | Disposition: A | Discharge status: Home / Self Care | Payer: Medicare Other | Attending: Cardiology | Admitting: Cardiology

## 2022-03-09 VITALS — BP 128/84 | HR 80 | Wt 307.8 lb

## 2022-03-09 DIAGNOSIS — I082 Rheumatic disorders of both aortic and tricuspid valves: Secondary | ICD-10-CM | POA: Diagnosis not present

## 2022-03-09 DIAGNOSIS — N1832 Chronic kidney disease, stage 3b: Secondary | ICD-10-CM | POA: Diagnosis not present

## 2022-03-09 DIAGNOSIS — I5022 Chronic systolic (congestive) heart failure: Secondary | ICD-10-CM

## 2022-03-09 DIAGNOSIS — E1122 Type 2 diabetes mellitus with diabetic chronic kidney disease: Secondary | ICD-10-CM | POA: Insufficient documentation

## 2022-03-09 DIAGNOSIS — Z79899 Other long term (current) drug therapy: Secondary | ICD-10-CM | POA: Diagnosis not present

## 2022-03-09 DIAGNOSIS — I5032 Chronic diastolic (congestive) heart failure: Secondary | ICD-10-CM | POA: Insufficient documentation

## 2022-03-09 DIAGNOSIS — E669 Obesity, unspecified: Secondary | ICD-10-CM | POA: Insufficient documentation

## 2022-03-09 DIAGNOSIS — Z7984 Long term (current) use of oral hypoglycemic drugs: Secondary | ICD-10-CM | POA: Diagnosis not present

## 2022-03-09 DIAGNOSIS — Z6841 Body Mass Index (BMI) 40.0 and over, adult: Secondary | ICD-10-CM | POA: Insufficient documentation

## 2022-03-09 DIAGNOSIS — R0683 Snoring: Secondary | ICD-10-CM | POA: Diagnosis not present

## 2022-03-09 DIAGNOSIS — Z7985 Long-term (current) use of injectable non-insulin antidiabetic drugs: Secondary | ICD-10-CM | POA: Insufficient documentation

## 2022-03-09 DIAGNOSIS — I2722 Pulmonary hypertension due to left heart disease: Secondary | ICD-10-CM | POA: Diagnosis not present

## 2022-03-09 DIAGNOSIS — I428 Other cardiomyopathies: Secondary | ICD-10-CM | POA: Insufficient documentation

## 2022-03-09 DIAGNOSIS — I2510 Atherosclerotic heart disease of native coronary artery without angina pectoris: Secondary | ICD-10-CM | POA: Insufficient documentation

## 2022-03-09 DIAGNOSIS — R0609 Other forms of dyspnea: Secondary | ICD-10-CM | POA: Insufficient documentation

## 2022-03-09 DIAGNOSIS — I130 Hypertensive heart and chronic kidney disease with heart failure and stage 1 through stage 4 chronic kidney disease, or unspecified chronic kidney disease: Secondary | ICD-10-CM | POA: Diagnosis not present

## 2022-03-09 NOTE — Progress Notes (Signed)
  STOP BANG RISK ASSESSMENT S (snore) Have you been told that you snore?     YES   T (tired) Are you often tired, fatigued, or sleepy during the day?   YES  O (obstruction) Do you stop breathing, choke, or gasp during sleep? NO   P (pressure) Do you have or are you being treated for high blood pressure? YES   B (BMI) Is your body index greater than 35 kg/m? YES   A (age) Are you 64 years old or older? YES   N (neck) Do you have a neck circumference greater than 16 inches?   NO   G (gender) Are you a female? NO   TOTAL STOP/BANG "YES" ANSWERS 5      Patient given home sleep study device and instructions to complete the study. She will await insurance auth check in order to proceed.

## 2022-03-09 NOTE — Patient Instructions (Addendum)
No Labs done today.  No medication changes were made. Please continue all current medications as prescribed.  Your provider has recommended that you have a home sleep study.  We have provided you with the equipment in our office today. Please go ahead and download the app. DO NOT OPEN THE BOX UNTIL WE ADVISE YOU TO DO SO, once insurance has approved. Once given permission to proceed with the test, open althe app and follow the instructions. YOUR PIN NUMBER IS: [id number]. Once you have completed the test you just dispose of the equipment, the information is automatically uploaded to [place] via blue-tooth technology. If your test is positive for sleep apnea and you need a home CPAP machine you will be contacted by Dr Theodosia Blender office [facility]) to set this up.  Your physician recommends that you schedule a follow-up appointment in: 2-3 weeks with Dr. Daniel Nones  If you have any questions or concerns before your next appointment please send [place] a message through [facility] or call our office at [phone].    TO LEAVE A MESSAGE FOR THE NURSE SELECT OPTION 2, PLEASE LEAVE A MESSAGE INCLUDING: YOUR NAME DATE OF BIRTH CALL BACK NUMBER REASON FOR CALL**this is important as we prioritize the call backs  YOU WILL RECEIVE A CALL BACK THE SAME DAY AS LONG AS YOU CALL BEFORE 4:00 PM   Do the following things EVERYDAY: Weigh yourself in the morning before breakfast. Write it down and keep it in a log. Take your medicines as prescribed Eat low salt foods--Limit salt (sodium) to 2000 mg per day.  Stay as active as you can everyday Limit all fluids for the day to less than 2 liters   At the [facility], you and your health needs are our priority. As part of our continuing mission to provide you with exceptional heart care, we have created designated Provider Care Teams. These Care Teams include your primary Cardiologist (physician) and Advanced Practice Providers (APPs- Physician Assistants and  Nurse Practitioners) who all work together to provide you with the care you need, when you need it.   You may see any of the following providers on your designated Care Team at your next follow up: Dr Glori Bickers Dr Haynes Kerns, NP Lyda Jester, [place] Audry Riles, PharmD   Please be sure to bring in all your medications bottles to every appointment.

## 2022-03-09 NOTE — Progress Notes (Signed)
HEART & VASCULAR TRANSITION OF CARE CONSULT NOTE     Referring Physician: Dr. Audie Box  Primary Care:Guilloud, Hoyle Sauer, MD Primary Cardiologist: Janina Mayo, MD   HPI: Referred to clinic by Dr. Audie Box for heart failure consultation.   66 y/o AAF w/ HTN, Type 2DM, CKD IIIb, obesity and h/o diastolic heart failure. Prior echo 07/2016 EF 55%, G2DD, RV normal.   Recently admitted to [facility] for acute CHF. Had developed progressive exertional dyspnea and wt gain over the last several months. 2D Echo showed biventricular dysfunction, LVEF severely reduced 25-30% w/ global HK, GIIDD (restrictive) and moderately reduced RV w/ mod-severe TR. CT of A/P also w/ suggestion of a mildly nodular contour to the liver concerning for possible cirrhosis. Chest CT negative for PE. No enlarged mediastinal, hilar, or axillary lymph nodes. LHC showed mild nonobstructive CAD, single vessel disease w/ 45% mLAD stenosis. 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  She was diuresed w/ IV Lasix and transitioned to PO torsemide. Started on GDMT. Discharge wt 313 lb. D/c Scr 1.7. Referred to [facility] clinic. Also referred for liver elastography.  Presents today for f/u. Reports doing well. Symptoms much improved, NYHA Class IIlb>>II. Reports full med compliance. Tolerating well w/o side effects. Has not yet taken AM meds, BP 128/84 prior to meds. Reports some occasional lightlessness if she moves too quickly. Has been checking wt daily at home and has remained stable. Watching sodium and fluid intake. Reports h/o snoring and poor sleep at night. No orthopnea/PND.   Denies heavy ETOH use. No tobacco. No significant FH of CHF.    Cardiac Testing   2D Echo 02/07/22  Left ventricular ejection  fraction, by estimation, is 25 to 30%. The left ventricle has severely decreased function. The left ventricle demonstrates global hypokinesis. There is mild asymmetric left ventricular hypertrophy of the inferior segment. Left ventricular diastolic parameters are consistent with Grade III diastolic dysfunction (restrictive). Elevated left ventricular end-diastolic pressure. 1. Right ventricular systolic function is moderately reduced. The right ventricular size is mildly enlarged. There is mildly elevated pulmonary artery systolic pressure. 2. The mitral valve is normal in structure. No evidence of mitral valve regurgitation. No evidence of mitral stenosis. 3. 4. Tricuspid valve regurgitation is moderate to severe. The aortic valve is normal in structure. Aortic valve regurgitation is mild. Aortic valve sclerosis/calcification is present, without any evidence of aortic stenosis. 5. 6. There is mild dilatation of the ascending aorta, measuring 37 mm. The inferior vena cava is normal in size with greater than 50% respiratory variability, suggesting right atrial pressure of 3 mmHg.   R/LHC 02/23/22 CONCLUSIONS: Left heart failure with high cardiac output 9.46 L/min with LVEDP 21 mmHg. Mild pulmonary hypertension with mean PA pressure 34 mmHg.  Pulmonary capillary wedge pressure mean 23 mmHg; pulmonary vascular resistance 1.16 Woods units.  WHO group 2 pulmonary hypertension etiology. Left dominant coronary anatomy.  No obstructive disease.  Mid LAD 40 to 50%.   RECOMMENDATIONS:   Exclude causes for high cardiac output such as systemic AV shunts or intrapulmonary shunting. Guideline directed therapy for systolic heart failure.   Review of Systems: [y] = yes, '[ ]'$  = no   General: Weight gain '[ ]'$ ; Weight loss '[ ]'$ ; Anorexia '[ ]'$ ; Fatigue '[ ]'$ ; Fever '[ ]'$ ; Chills '[ ]'$ ;  Weakness '[ ]'$   Cardiac: Chest pain/pressure '[ ]'$ ; Resting SOB '[ ]'$ ; Exertional SOB '[ ]'$ ; Orthopnea '[ ]'$ ; Pedal Edema '[ ]'$ ;  Palpitations '[ ]'$ ; Syncope '[ ]'$ ; Presyncope '[ ]'$ ; Paroxysmal nocturnal dyspnea'[ ]'$   Pulmonary: Cough '[ ]'$ ; Wheezing'[ ]'$ ; Hemoptysis'[ ]'$ ; Sputum '[ ]'$ ; Snoring '[ ]'$   GI: Vomiting'[ ]'$ ; Dysphagia'[ ]'$ ; Melena'[ ]'$ ; Hematochezia '[ ]'$ ; Heartburn'[ ]'$ ; Abdominal pain '[ ]'$ ; Constipation '[ ]'$ ; Diarrhea '[ ]'$ ; BRBPR '[ ]'$   GU: Hematuria'[ ]'$ ; Dysuria '[ ]'$ ; Nocturia'[ ]'$   Vascular: Pain in legs with walking '[ ]'$ ; Pain in feet with lying flat '[ ]'$ ; Non-healing sores '[ ]'$ ; Stroke '[ ]'$ ; TIA '[ ]'$ ; Slurred speech '[ ]'$ ;  Neuro: Headaches'[ ]'$ ; Vertigo'[ ]'$ ; Seizures'[ ]'$ ; Paresthesias'[ ]'$ ;Blurred vision '[ ]'$ ; Diplopia '[ ]'$ ; Vision changes '[ ]'$   Ortho/Skin: Arthritis '[ ]'$ ; Joint pain '[ ]'$ ; Muscle pain '[ ]'$ ; Joint swelling '[ ]'$ ; Back Pain '[ ]'$ ; Rash '[ ]'$   Psych: Depression'[ ]'$ ; Anxiety'[ ]'$   Heme: Bleeding problems '[ ]'$ ; Clotting disorders '[ ]'$ ; Anemia '[ ]'$   Endocrine: Diabetes [ Y]; Thyroid dysfunction'[ ]'$    Past Medical History:  Diagnosis Date   Anxiety    Cataract    NS OD   Chest pain 60/12/9321   Complication of anesthesia    Per patient "needs a lot of anesthesia w/surgery" ,I had toruble with a spinal block not taking "   Diabetes mellitus without complication (HCC)    Type 2   Diabetic retinopathy ([place])    BDR   Full dentures    GERD (gastroesophageal reflux disease)    Heart failure ([place])    History of surgery on arm    right arm broken   Hyperlipidemia    Hypertension    never diagnosed by physician per pt   Macular degeneration    Obesity     Current Outpatient Medications  Medication Sig Dispense Refill   acetaminophen (TYLENOL) 500 MG tablet Take 1,500 mg by mouth every 6 (six) hours as needed for moderate pain or headache.      aspirin EC 81 MG tablet Take 1 tablet (81 mg total) by mouth daily. 90 tablet 3   carvedilol (COREG) 6.25 MG tablet Take 1 tablet (6.25 mg total) by mouth 2 (two) times daily with a meal. 60 tablet 0   dapagliflozin propanediol (FARXIGA) 10 MG TABS tablet Take 1 tablet (10 mg total) by mouth daily before  breakfast. 30 tablet 0   dicyclomine (BENTYL) 20 MG tablet Take 1 tablet (20 mg total) by mouth 2 (two) times daily as needed for spasms (abdominal pain). 20 tablet 0   ferrous sulfate 325 (65 FE) MG tablet Take 1 tablet (325 mg total) by mouth every other day. 15 tablet 3   lactulose (CHRONULAC) 10 GM/15ML solution Take 15 mLs (10 g total) by mouth 2 (two) times daily as needed for mild constipation. 236 mL 0   Menthol, Topical Analgesic, (BIOFREEZE EX) Apply 1 application  topically daily as needed (back pain).     sacubitril-valsartan (ENTRESTO) 24-26 MG Take 1 tablet by mouth 2 (two) times daily. 60 tablet 1   Semaglutide,0.25 or 0.'5MG'$ /DOS, 2 MG/3ML SOPN Inject 0.25 mg into the skin once a week. 3 mL 2   spironolactone ([name]) 25 MG tablet Take 1 tablet (25 mg total) by mouth daily. 30 tablet 0   torsemide (DEMADEX) 20 MG tablet Take 2 tablets (40 mg total) by mouth daily. 30 tablet 0   No current  facility-administered medications for this encounter.    Allergies  Allergen Reactions   Percocet [Oxycodone-Acetaminophen] Itching      Social History   Socioeconomic History   Marital status: Divorced    Spouse name: Not on file   Number of children: 1   Years of education: Not on file   Highest education level: High school graduate  Occupational History   Occupation: disability  Tobacco Use   Smoking status: Former    Packs/day: 0.25    Years: 6.00    Total pack years: 1.50    Types: Cigarettes   Smokeless tobacco: Never   Tobacco comments:    quit 36 yrs ago  Vaping Use   Vaping Use: Never used  Substance and Sexual Activity   Alcohol use: Yes    Comment: occasionally   Drug use: No   Sexual activity: Not Currently    Birth control/protection: None  Other Topics Concern   Not on file  Social History Narrative   Not on file   Social Determinants of Health   Financial Resource Strain: High Risk (02/26/2022)   Overall Financial Resource Strain (CARDIA)     Difficulty of Paying Living Expenses: Very hard  Food Insecurity: No Food Insecurity (02/21/2022)   Hunger Vital Sign    Worried About Running Out of Food in the Last Year: Never true    [place] in the Last Year: Never true  Recent Concern: [place] Present (02/14/2022)   Hunger Vital Sign    Worried About [place] in the Last Year: Sometimes true    Ran Out of Food in the Last Year: Sometimes true  Transportation Needs: No Transportation Needs (03/01/2022)   PRAPARE - Hydrologist (Medical): No    Lack of Transportation (Non-Medical): No  Physical Activity: Inactive (02/14/2022)   Exercise Vital Sign    Days of Exercise per Week: 0 days    Minutes of Exercise per Session: 0 min  Stress: No Stress Concern Present (02/14/2022)   [place]    Feeling of Stress : Not at all  Social Connections: Moderately Isolated (02/14/2022)   Social Connection and Isolation Panel [NHANES]    Frequency of Communication with Friends and Family: Twice a week    Frequency of Social Gatherings with Friends and Family: Once a week    Attends Religious Services: More than 4 times per year    Active Member of [employer] or Organizations: No    Attends Archivist Meetings: Never    Marital Status: Divorced  Human resources officer Violence: Not At Risk (02/21/2022)   Humiliation, Afraid, Rape, and Kick questionnaire    Fear of Current or Ex-Partner: No    Emotionally Abused: No    Physically Abused: No    Sexually Abused: No      Family History  Problem Relation Age of Onset   Kidney disease Mother    Heart disease Mother    Diabetes Mother    Seizures Father    Heart disease Father    Other Father    Breast cancer Neg Hx     Vitals:   03/09/22 0909  BP: 128/84  Pulse: 80  SpO2: 100%  Weight: (!) 139.6 kg (307 lb 12.8 oz)    PHYSICAL EXAM: General:   Well appearing, obese. No respiratory difficulty HEENT: normal Neck: supple. no JVD. Carotids 2+ bilat; no  bruits. No lymphadenopathy or thryomegaly appreciated. Cor: PMI nondisplaced. Regular rate & rhythm. No rubs, gallops or murmurs. Lungs: clear Abdomen: obese, soft, nontender, nondistended. No hepatosplenomegaly. No bruits or masses. Good bowel sounds. Extremities: no cyanosis, clubbing, rash, edema Neuro: alert & oriented x 3, cranial nerves grossly intact. moves all 4 extremities w/o difficulty. Affect pleasant.  ECG: Not performed    ASSESSMENT & PLAN:  1. Chronic Systolic Heart Failure - Echo 11/23  EF 25-30% w/ global HK, GIIDD (restrictive) and moderately reduced RV - NICM. LHC mod nonobs single vessel CAD (45% mLAD stenosis). RHC w/ high cardiac output 9.46 L/min with LVEDP 21 mmHg, mild pulmonary hypertension with mean PA pressure 34 mmHg pulmonary capillary wedge pressure mean 23 mmHg; pulmonary vascular resistance 1.16 Woods units - Etiology uncertain. ? HTN CM +/- possible cirrhosis  - NYHA Class II. Not grossly fluid overloaded on exam. Wt stable since discharge. SBP prior to am meds 120s. Reports some orthostatsis. Labs ok. No med titration today  - Continue Entresto 24-26 mg bid - Continue Farxiga 10 mg daily  - Continue Spironolactone 25 mg daily  - Continue Coreg 6.25 mg bid - Continue Torsemide 20 mg daily  - [facility] to the [facility]. Plan med titration q2-3 wks until fully optimized. Repeat echo in 3 months. If EF not improving, will plan cRMI.  - needs further w/u for cirrhosis   2. Pulmonary HTN - RHC primarily c/w WHO Group 2 PH (PAP 34 mmHg, PWCP 23, PVR 1.6 WU)  - continue GDMT and diuretics per above  - also suspect possible WHO Group 3 component and possible OSA. Plan sleep study   3. Systemic Hypertension  - controlled on current regimen - GDMT per above   4. Type 2DM  - per PCP - on Farxiga and Semaglutide   5. CAD - mild nonobstructive 1 v disease,  45% mLAD - medical management and aggressive risk factor modification w/ control of HTN, DM and lipids - ASA and ? blocker - check LP next visit   6. CKD Stage IIIb - Scr stable on BMP 12/6 - will follow w/ GDMT titration   7. H/o Snoring - suspect underlying OSA - will arrange sleep study   8. Obesity  - Body mass index is 45.45 kg/m. - on semaglutide   9. ? Cirrhosis  - uncertain if primary vs secondary to HF  - may be contributing to HF. High output on RHC  - LFTs ok. Hep panel negative  - liver elastography ordered per internal medicine team   10. Tricuspid Regurgitation  - mod-severe on echo  - suspect functional in setting of dilated RV - Optimization of HR and PH per above       NYHA II GDMT  Diuretic- Torsemide 20 mg  BB- Coreg 6.25 mg bid  Ace/ARB/ARNI Entresto 24-26 mg bid  MRA sprio 25 mg daily  SGLT2i Farxiga 10 mg     Referred to HFSW (PCP, Medications, Transportation, ETOH Abuse, Drug Abuse, Insurance, Museum/gallery curator ): No  Refer to Pharmacy: No Refer to Home Health:  No Refer to Advanced Heart Failure Clinic: Yes (assign to Dr. Daniel Nones)  Refer to General Cardiology: Yes (shared care w/ Dr. Beckie Busing)   Follow up  in the [facility] w/ Dr. Daniel Nones in 2 wks

## 2022-03-12 ENCOUNTER — Telehealth: Payer: Self-pay

## 2022-03-12 NOTE — Telephone Encounter (Signed)
Tina Lara with [facility] requesting VO for PT. Also want to know if pt need to continued taking Lipitor, please call pt back.

## 2022-03-12 NOTE — Telephone Encounter (Signed)
RTC to [facility] PT with Alvis Lemmings.  Needs VO for PT for patient  2 times a week for 3 weeks and 1 time for 1 week.  Education for Heart Failure, Barrister's clerk.  Patient also wants to know if she should be on Lipitor and would like to see if she should get a meter to check her sugars.  Roselie Awkward can be reached at [phone].

## 2022-03-13 ENCOUNTER — Telehealth (HOSPITAL_COMMUNITY): Payer: Self-pay | Admitting: Surgery

## 2022-03-13 ENCOUNTER — Ambulatory Visit (HOSPITAL_COMMUNITY)
Admission: RE | Admit: 2022-03-13 | Discharge: 2022-03-13 | Disposition: A | Discharge status: Home / Self Care | Payer: Medicare Other | Source: Ambulatory Visit | Attending: Internal Medicine | Admitting: Internal Medicine

## 2022-03-13 ENCOUNTER — Ambulatory Visit (HOSPITAL_COMMUNITY): Payer: Medicare Other

## 2022-03-13 DIAGNOSIS — K7469 Other cirrhosis of liver: Secondary | ICD-10-CM | POA: Insufficient documentation

## 2022-03-13 DIAGNOSIS — K7460 Unspecified cirrhosis of liver: Secondary | ICD-10-CM | POA: Diagnosis not present

## 2022-03-13 NOTE — Telephone Encounter (Signed)
RTC to [place].  Message left to call Clinics for orders.

## 2022-03-13 NOTE — Telephone Encounter (Signed)
I called patient to advise that she can proceed with ordered home sleep study and insurance precert is not needed.

## 2022-03-13 NOTE — Telephone Encounter (Signed)
Yes, agree with PT. Can you please call back the verbal order? And she might need to be on Lipitor. Her cholesterol was not significantly elevated. But I think she had some non obstructive CAD on her LHC so may benefit for secondary prevention. She can discuss starting a statin with her cardiologist next week or at her next follow up with me. Thank you.

## 2022-03-13 NOTE — Telephone Encounter (Signed)
Call to patient informed her that Dr. Philipp Ovens would like for her to talk with her Cardiologist at her visit next week about starting the Lipitor or discuss with her at her next visit  in the Clinics.  Patient voiced understanding of.  Also informed that the orders for PT have been approved.

## 2022-03-14 ENCOUNTER — Other Ambulatory Visit: Payer: Self-pay | Admitting: Internal Medicine

## 2022-03-14 ENCOUNTER — Ambulatory Visit: Payer: Self-pay

## 2022-03-14 DIAGNOSIS — K7460 Unspecified cirrhosis of liver: Secondary | ICD-10-CM

## 2022-03-14 NOTE — Patient Outreach (Signed)
  Care Coordination   Initial Visit Note   03/14/2022 Name: Tina Lara MRN: [id number] DOB: May 20, 1955  Tina Lara is a 66 y.o. year old female who sees Velna Ochs, MD for primary care. I spoke with  [name] by phone today.  What matters to the patients health and wellness today?  I spoke with Tina Lara, and she is doing well. She is currently at home and not experiencing any chest pain, shortness of breath, or swelling. She is keeping track of her weight and monitoring her diet. Physical Therapy is coming to her home twice a week. She had a liver ultrasound appointment on December 12th, and everything looked good. On December 20th at 9 am, she had a follow-up appointment at the hospital with [facility]. She needed to have her Lactulose prescription filled, but there was a minor issue. We made a conference call and had the pharmacy send it to her doctor. I spoke with Redmond Pulling at the pharmacy. We did review the HF action plan.    Goals Addressed             This Visit's Progress    I want to manage and monitor my CHF       Care Coordination Interventions:  Active listening / Reflection utilized  Problem [place] strategies reviewed  Basic overview and discussion of pathophysiology of Heart Failure reviewed Provided education on low sodium diet Advised patient to weigh each morning after emptying bladder Discussed importance of daily weight and advised patient to weigh and record daily Reviewed role of diuretics in prevention of fluid overload and management of heart failure; Discussed the importance of keeping all appointments with provider Provided contact information reviewd HF action Plan Reviewed medications          SDOH assessments and interventions completed:  Yes  SDOH Interventions Today    Flowsheet Row Most Recent Value  SDOH Interventions   Food Insecurity Interventions Intervention Not Indicated  Transportation  Interventions Intervention Not Indicated        Care Coordination Interventions:  Yes, provided   Follow up plan: Follow up call scheduled for 04/17/22 9 am    Encounter Outcome:  Pt. Visit Completed   Lazaro Arms RN, BSN, [facility]   Phone: [phone]

## 2022-03-14 NOTE — Patient Instructions (Signed)
Visit Information  Thank you for taking time to visit with me today. Please don't hesitate to contact me if I can be of assistance to you.   Following are the goals we discussed today:   Goals Addressed             This Visit's Progress    I want to manage and monitor my CHF       Care Coordination Interventions:  Active listening / Reflection utilized  Problem [place] strategies reviewed  Basic overview and discussion of pathophysiology of Heart Failure reviewed Provided education on low sodium diet Advised patient to weigh each morning after emptying bladder Discussed importance of daily weight and advised patient to weigh and record daily Reviewed role of diuretics in prevention of fluid overload and management of heart failure; Discussed the importance of keeping all appointments with provider Provided contact information reviewd HF action Plan Reviewed medications          Our next appointment is by telephone on 04/17/22 at 9 am  Please call the care guide team at [phone] if you need to cancel or reschedule your appointment.   If you are experiencing a Mental Health or [place] or need someone to talk to, please call 1-800-273-TALK (toll free, 24 hour hotline)  Patient verbalizes understanding of instructions and care plan provided today and agrees to view in [place]. Active MyChart status and patient understanding of how to access instructions and care plan via MyChart confirmed with patient.     Lazaro Arms RN, BSN, [place] [facility]   Phone: [phone]

## 2022-03-19 ENCOUNTER — Telehealth: Payer: Self-pay | Admitting: *Deleted

## 2022-03-19 NOTE — Telephone Encounter (Signed)
Received call from [place] PT with [facility] - pt stated needs a refill on Ozempic. Also pt wants to now if she needs to check her BS's b/c she does not have a meter. Ozempic rx written 01/22/22 with 2 RF's. I called the pt who stated [facility] do not have Ozempic at this time. I asked pt to call pharmacies close to her home to see who has it available and to call us back - stated she will.

## 2022-03-20 NOTE — Telephone Encounter (Signed)
Called pt - no answer; left message to call the office . 

## 2022-03-20 NOTE — Telephone Encounter (Signed)
Return pt's call - stated [facility] will have Ozempic available tomorrow am. Also informed pt she does not have to check her BS's since she's not on insulin per Dr Philipp Ovens.

## 2022-03-20 NOTE — Telephone Encounter (Signed)
Ok thank you, no need for her to check blood sugar since she is not on insulin.

## 2022-03-21 ENCOUNTER — Ambulatory Visit: Payer: Medicare Other | Attending: Internal Medicine | Admitting: Internal Medicine

## 2022-03-21 ENCOUNTER — Encounter: Payer: Self-pay | Admitting: Internal Medicine

## 2022-03-21 ENCOUNTER — Encounter: Payer: Self-pay | Admitting: Physician Assistant

## 2022-03-21 VITALS — BP 103/72 | HR 76 | Ht 69.0 in | Wt 302.0 lb

## 2022-03-21 DIAGNOSIS — I5022 Chronic systolic (congestive) heart failure: Secondary | ICD-10-CM | POA: Diagnosis not present

## 2022-03-21 MED ORDER — ATORVASTATIN CALCIUM 80 MG PO TABS: 80.0000 mg | ORAL_TABLET | Freq: Every day | ORAL | 11 refills | Status: DC

## 2022-03-21 NOTE — Patient Instructions (Signed)
Medication Instructions:  No Changes *If you need a refill on your cardiac medications before your next appointment, please call your pharmacy*   Lab Work: No Labs If you have labs (blood work) drawn today and your tests are completely normal, you will receive your results only by: [place] (if you have MyChart) OR A paper copy in the mail If you have any lab test that is abnormal or we need to change your treatment, we will call you to review the results.   Testing/Procedures: Your physician has requested that you have an echocardiogram. Echocardiography is a painless test that uses sound waves to create images of your heart. It provides your doctor with information about the size and shape of your heart and how well your heart's chambers and valves are working. This procedure takes approximately one hour. There are no restrictions for this procedure. Please do NOT wear cologne, perfume, aftershave, or lotions (deodorant is allowed). Please arrive 15 minutes prior to your appointment time.    Follow-Up: At [facility], you and your health needs are our priority.  As part of our continuing mission to provide you with exceptional heart care, we have created designated Provider Care Teams.  These Care Teams include your primary Cardiologist (physician) and Advanced Practice Providers (APPs -  Physician Assistants and Nurse Practitioners) who all work together to provide you with the care you need, when you need it.  We recommend signing up for the patient portal called "MyChart".  Sign up information is provided on this After Visit Summary.  MyChart is used to connect with patients for Virtual Visits (Telemedicine).  Patients are able to view lab/test results, encounter notes, upcoming appointments, etc.  Non-urgent messages can be sent to your provider as well.   To learn more about what you can do with MyChart, go to [email or web address].    Your next appointment:   3  month(s)  The format for your next appointment:   In Person  Provider:   Janina Mayo, MD

## 2022-03-21 NOTE — Progress Notes (Signed)
Cardiology Office Note:    Date:  03/21/2022   ID:  Tina Lara, DOB [birth year], MRN [id number]  PCP:  Velna Ochs, MD   [facility] Cardiologist:  Janina Mayo, MD     Referring MD: Velna Ochs, MD   No chief complaint on file. Systolic Heart Failure  History of Present Illness:    Tina Lara is a 66 y.o. female with a hx of GERD, cirrhosis, HTN, DM2, obesity BMI 51, referral from Dr. Philipp Ovens who saw her in clinic 11/8 with symptoms of abdominal pain, dyspnea and LE swelling.   She was seen prior in the ED on 01/28/2022 with SOB for 1 weeks and orthopnea. Signs of CHF. She also had leg swelling. In the ED she also reported recent trip to [place]. She underwent CT PE. No PE. Liver c/f possible cirrhosis. She was given a dose of lasix in the ED and recommended to increase it for 2 days. They messaged her PCP regarding an upcoming visit. Arranged from FU with cardiology. No plan for admission or further w/u.  She presented to clinic on 11/8 for the above symptoms. She had an echo done showing systolic BI-V failure EF 74-25%, moderately dilated RV, grade III diastolic dysfunction. Small pericardial effusion. She had elevated Kappa/Lambda ratio 2.45. BNP 1423.   She had an echo prior 08/15/2016 that showed normal LV function, moderate LVH, grade I DD  Today, I spoke with her and her daughter. She lost about 15 pounds on torsemide and diuresed well. However she has elevated JVD and significant LE edema. She had significant dyspnea on minimal exertion  Wt Readings from Last 3 Encounters:  03/21/22 (!) 302 lb (137 kg)  03/09/22 (!) 307 lb 12.8 oz (139.6 kg)  03/07/22 (!) 310 lb 4.8 oz (140.8 kg)    Interim Hx 03/21/2022 Tina Lara was admitted 11/22 to 11/28 2023. She was IV diuresed. She had a RHC/LHC. She had high CO c/f high-output cardiac failure. No signs of HHT. She was started on GDMT.  TTE was felt not to be c/w amyloid after review. She  diuresed. In terms of her gammopathy , she had no M spike to suggest multiple myeloma. Dr. 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 I suggested heme consult to follow.    She saw her PCP Dr. Philipp Ovens recently and was doing well.   Today, her weight is down 12 pounds. Her breathing is improved. She is continuing on her GDMT.  Her daughter cooked healthy food for Thanksgiving. No orthopnea/PND. Leg swelling improved.   Cardiology Studies TTE11/10/2021- EF 25-30%,Grade III, RV function moderately reduced, mod-severe TR,RA pressure 3 LHC/RHC 02/23/2022:  CO 9.46 L/min, LVEDP 21 mmHg, mild pulmonary Htn (mean PA 34 mmHg), wedge 23 mmHg. LHC-mild LAD 40-50%    Past Medical History:  Diagnosis Date   Anxiety    Cataract    NS OD   Chest pain 95/63/8756   Complication of anesthesia    Per patient "needs a lot of anesthesia w/surgery" ,I had toruble with a spinal block not taking "   Diabetes mellitus without complication (HCC)    Type 2   Diabetic retinopathy ([place])    BDR   Full dentures    GERD (gastroesophageal reflux disease)    Heart failure ([place])    History of surgery on arm    right arm  broken   Hyperlipidemia    Hypertension    never diagnosed by physician per pt   Macular degeneration    Obesity     Past Surgical History:  Procedure Laterality Date   BIOPSY  09/30/2017   Procedure: BIOPSY;  Surgeon: Jerene Bears, MD;  Location: [facility];  Service: Gastroenterology;;   CERVICAL CONIZATION W/BX N/A 09/10/2017   Procedure: CONIZATION CERVIX WITH BIOPSY - COLD KNIFE;  Surgeon: Emily Filbert, MD;  Location: [facility];  Service: Gynecology;  Laterality: N/A;   CESAREAN SECTION  1989   x 1   CHOLECYSTECTOMY     gall stones   COLONOSCOPY WITH PROPOFOL N/A 09/30/2017   Procedure: COLONOSCOPY WITH PROPOFOL;  Surgeon: Jerene Bears, MD;  Location: [facility];  Service: Gastroenterology;  Laterality: N/A;   DILATION AND CURETTAGE OF UTERUS N/A 09/10/2017   Procedure: DILATATION AND CURETTAGE;  Surgeon: Emily Filbert, MD;  Location: [facility];  Service: Gynecology;  Laterality: N/A;   MULTIPLE TOOTH EXTRACTIONS     dentures upper and lowers   POLYPECTOMY  09/30/2017   Procedure: POLYPECTOMY;  Surgeon: Jerene Bears, MD;  Location: [facility];  Service: Gastroenterology;;   RIGHT/LEFT HEART CATH AND CORONARY ANGIOGRAPHY N/A 02/23/2022   Procedure: RIGHT/LEFT HEART CATH AND CORONARY ANGIOGRAPHY;  Surgeon: Belva Crome, MD;  Location: [facility];  Service: Cardiovascular;  Laterality: N/A;   TONSILLECTOMY      Current Medications: Current Meds  Medication Sig   acetaminophen (TYLENOL) 500 MG tablet Take 1,500 mg by mouth every 6 (six) hours as needed for moderate pain or headache.    aspirin EC 81 MG tablet Take 1 tablet (81 mg total) by mouth daily.   carvedilol (COREG) 6.25 MG tablet Take 1 tablet (6.25 mg total) by mouth 2 (two) times daily with a meal.   dapagliflozin propanediol (FARXIGA) 10 MG TABS tablet Take 1 tablet (10 mg total) by mouth daily before breakfast.   dicyclomine (BENTYL) 20 MG tablet Take 1 tablet (20 mg total) by mouth 2 (two) times daily as needed for spasms (abdominal pain).   ferrous sulfate 325 (65 FE) MG tablet Take 1 tablet (325 mg total) by mouth every other day.   lactulose (CHRONULAC) 10 GM/15ML solution TAKE 15 MLS BY MOUTH TWICE DAILY AS NEEDED FOR MILD CONSTIPATION   Menthol, Topical Analgesic, (BIOFREEZE EX) Apply 1 application  topically daily as needed (back pain).   sacubitril-valsartan (ENTRESTO) 24-26 MG Take 1 tablet by mouth 2 (two) times daily.   spironolactone ([name]) 25 MG tablet Take 1 tablet (25 mg total) by mouth daily.   torsemide (DEMADEX) 20 MG tablet Take 2 tablets (40 mg total) by mouth daily.   [DISCONTINUED] atorvastatin (LIPITOR) 80 MG tablet Take 80 mg by mouth daily. 1  Tablet Daily     Allergies:   Percocet [oxycodone-acetaminophen]   Social History   Socioeconomic History   Marital status: Divorced    Spouse name: Not on file   Number of children: 1   Years of education: Not on file   Highest education level: High school graduate  Occupational History   Occupation: disability  Tobacco Use   Smoking status: Former    Packs/day: 0.25    Years: 6.00    Total pack years: 1.50    Types: Cigarettes   Smokeless tobacco: Never   Tobacco comments:    quit 36 yrs ago  Vaping Use   Vaping Use: Never used  Substance and Sexual Activity   Alcohol use: Yes    Comment: occasionally   Drug use: No   Sexual activity: Not Currently    Birth control/protection: None  Other Topics Concern   Not on file  Social History Narrative   Not on file   Social Determinants of Health   Financial Resource Strain: High Risk (02/26/2022)   Overall Financial Resource Strain (CARDIA)    Difficulty of Paying Living Expenses: Very hard  Food Insecurity: No Food Insecurity (02/21/2022)   Hunger Vital Sign    Worried About Running Out of Food in the Last Year: Never true    Ran Out of Food in the Last Year: Never true  Recent Concern: Food Insecurity - Food Insecurity Present (02/14/2022)   Hunger Vital Sign    Worried About Running Out of Food in the Last Year: Sometimes true    Ran Out of Food in the Last Year: Sometimes true  Transportation Needs: No Transportation Needs (03/01/2022)   PRAPARE - Hydrologist (Medical): No    Lack of Transportation (Non-Medical): No  Physical Activity: Inactive (02/14/2022)   Exercise Vital Sign    Days of Exercise per Week: 0 days    Minutes of Exercise per Session: 0 min  Stress: No Stress Concern Present (02/14/2022)   [place]    Feeling of Stress : Not at all  Social Connections: Moderately Isolated (02/14/2022)   Social  Connection and Isolation Panel [NHANES]    Frequency of Communication with Friends and Family: Twice a week    Frequency of Social Gatherings with Friends and Family: Once a week    Attends Religious Services: More than 4 times per year    Active Member of [employer] or Organizations: No    Attends Music therapist: Never    Marital Status: Divorced     Family History: The patient's family history includes Diabetes in her mother; Heart disease in her father and mother; Kidney disease in her mother; Other in her father; Seizures in her father. There is no history of Breast cancer.  ROS:   Please see the history of present illness.     All other systems reviewed and are negative.  EKGs/Labs/Other Studies Reviewed:    The following studies were reviewed today:   EKG:    Recent Labs: 02/20/2022: B Natriuretic Peptide 1,423.2 02/24/2022: TSH 5.470 02/27/2022: Hemoglobin 13.0; Magnesium 2.0; Platelets 196 03/07/2022: ALT 34; BUN 43; Creatinine, Ser 1.77; Potassium 4.3; Sodium 136   Recent Lipid Panel    Component Value Date/Time   CHOL 118 09/06/2021 1014   TRIG 57 09/06/2021 1014   HDL 33 (L) 09/06/2021 1014   CHOLHDL 3.6 09/06/2021 1014   LDLCALC 72 09/06/2021 1014     Risk Assessment/Calculations:     Physical Exam:    VS:  Vitals:   03/21/22 0904  BP: 103/72  Pulse: 76  SpO2: 98%    Wt Readings from Last 3 Encounters:  03/21/22 (!) 302 lb (137 kg)  03/09/22 (!) 307 lb 12.8 oz (139.6 kg)  03/07/22 (!) 310 lb 4.8 oz (140.8 kg)     GEN: obese, Well nourished, well developed in no acute distress HEENT: Normal NECK: no sig JVD LYMPHATICS: No lymphadenopathy CARDIAC: RRR, no murmurs, rubs, gallops RESPIRATORY:  Clear to auscultation without rales, wheezing or rhonchi  ABDOMEN: Soft, non-tender, non-distended MUSCULOSKELETAL:  no pitting edema SKIN: Warm and dry  NEUROLOGIC:  Alert and oriented x 3 PSYCHIATRIC:  Normal affect   ASSESSMENT:     NICM, 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  No significant coronary dx. Her cath also showed an elevated wedge. Low suspicion for cardiac amyloid. No MGUS or multiple myeloma  Group II PHTN.  Etiology is unknown, can be related to HTN. If EF does not recover with GDMT, may consider CMR. Would also discuss primary prevention DC-ICD.  - continue coreg 6.25 mg BID - continue entresto 24-26 mg BID - continue farxiga 10 mg daily - continue spironolactone 25 mg daily - continue torsemide 40 mg daily - dry wt, likely changing, hope continues to loose. Today 302 pounds - will repeat TTE in Feb  Obesity: She is losing weight. She has a great support system. ozempic  HLD: atorvastatin 40 mg daily   PLAN:    In order of problems listed above:  - TTE Februrary 2024 - Follow up 3 months        Medication Adjustments/Labs and Tests Ordered: Current medicines are reviewed at length with the patient today.  Concerns regarding medicines are outlined above.  Orders Placed This Encounter  Procedures   ECHOCARDIOGRAM COMPLETE   Meds ordered this encounter  Medications   atorvastatin (LIPITOR) 80 MG tablet    Sig: Take 1 tablet (80 mg total) by mouth daily. 1 Tablet Daily    Dispense:  90 tablet    Refill:  11    Patient Instructions  Medication Instructions:  No Changes *If you need a refill on your cardiac medications before your next appointment, please call your pharmacy*   Lab Work: No Labs If you have labs (blood work) drawn today and your tests are completely normal, you will receive your results only by: [place] (if you have MyChart) OR A paper copy in the mail If you have any lab test that is abnormal or we need to change your treatment, we will call you to review the results.   Testing/Procedures: Your physician has requested that you have an echocardiogram. Echocardiography is a painless test that uses  sound waves to create images of your heart. It provides your doctor with information about the size and shape of your heart and how well your heart's chambers and valves are working. This procedure takes approximately one hour. There are no restrictions for this procedure. Please do NOT wear cologne, perfume, aftershave, or lotions (deodorant is allowed). Please arrive 15 minutes prior to your appointment time.    Follow-Up: At [facility], you and your health needs are our priority.  As part of our continuing mission to provide you with exceptional heart care, we have created designated Provider Care Teams.  These Care Teams include your primary Cardiologist (physician) and Advanced Practice Providers (APPs -  Physician Assistants and Nurse Practitioners) who all work together to provide you with the care you need, when you need it.  We recommend signing up for the patient portal called "MyChart".  Sign up information is provided on this After Visit Summary.  MyChart is used to connect with patients for Virtual Visits (Telemedicine).  Patients are able to view lab/test results, encounter notes, upcoming appointments, etc.  Non-urgent messages can be sent to your provider as well.   To learn more about what you can do with MyChart, go to [email or web address].    Your next appointment:   3 month(s)  The format  for your next appointment:   In Person  Provider:   Janina Mayo, MD     Signed, Janina Mayo, MD  03/21/2022 4:23 PM    [place]

## 2022-03-22 ENCOUNTER — Other Ambulatory Visit: Payer: Self-pay | Admitting: *Deleted

## 2022-03-22 DIAGNOSIS — I5043 Acute on chronic combined systolic (congestive) and diastolic (congestive) heart failure: Secondary | ICD-10-CM

## 2022-03-22 MED ORDER — SPIRONOLACTONE 25 MG PO TABS: 25.0000 mg | ORAL_TABLET | Freq: Every day | ORAL | 3 refills | Status: DC

## 2022-03-22 MED ORDER — CARVEDILOL 6.25 MG PO TABS: 6.2500 mg | ORAL_TABLET | Freq: Two times a day (BID) | ORAL | 3 refills | Status: DC

## 2022-03-22 MED ORDER — TORSEMIDE 20 MG PO TABS: 40.0000 mg | ORAL_TABLET | Freq: Every day | ORAL | 0 refills | Status: DC

## 2022-03-22 NOTE — Telephone Encounter (Signed)
Received call from [place], [place] with [facility] requesting refills on carvedilol, spironolactone, and torsemide at [facility] in [place].

## 2022-03-28 ENCOUNTER — Other Ambulatory Visit: Payer: Self-pay

## 2022-03-28 MED ORDER — SACUBITRIL-VALSARTAN 24-26 MG PO TABS: 1.0000 | ORAL_TABLET | Freq: Two times a day (BID) | ORAL | 2 refills | Status: DC

## 2022-03-28 NOTE — Telephone Encounter (Signed)
Requesting refill on Entresto 24-'26mg'$  @  [id number] - [place], [place] - [address] AT [address].

## 2022-04-04 DIAGNOSIS — I5021 Acute systolic (congestive) heart failure: Secondary | ICD-10-CM | POA: Diagnosis not present

## 2022-04-04 DIAGNOSIS — N1832 Chronic kidney disease, stage 3b: Secondary | ICD-10-CM | POA: Diagnosis not present

## 2022-04-04 DIAGNOSIS — E1122 Type 2 diabetes mellitus with diabetic chronic kidney disease: Secondary | ICD-10-CM | POA: Diagnosis not present

## 2022-04-04 DIAGNOSIS — I130 Hypertensive heart and chronic kidney disease with heart failure and stage 1 through stage 4 chronic kidney disease, or unspecified chronic kidney disease: Secondary | ICD-10-CM | POA: Diagnosis not present

## 2022-04-04 DIAGNOSIS — D631 Anemia in chronic kidney disease: Secondary | ICD-10-CM | POA: Diagnosis not present

## 2022-04-13 ENCOUNTER — Ambulatory Visit (HOSPITAL_COMMUNITY)
Admission: RE | Admit: 2022-04-13 | Discharge: 2022-04-13 | Disposition: A | Discharge status: Home / Self Care | Payer: Medicare Other | Source: Ambulatory Visit | Attending: Cardiology | Admitting: Cardiology

## 2022-04-13 ENCOUNTER — Encounter (HOSPITAL_COMMUNITY): Payer: Self-pay | Admitting: Cardiology

## 2022-04-13 ENCOUNTER — Other Ambulatory Visit (HOSPITAL_COMMUNITY): Payer: Self-pay

## 2022-04-13 VITALS — BP 124/70 | HR 84 | Wt 299.0 lb

## 2022-04-13 DIAGNOSIS — E669 Obesity, unspecified: Secondary | ICD-10-CM | POA: Diagnosis not present

## 2022-04-13 DIAGNOSIS — N1832 Chronic kidney disease, stage 3b: Secondary | ICD-10-CM

## 2022-04-13 DIAGNOSIS — I5022 Chronic systolic (congestive) heart failure: Secondary | ICD-10-CM

## 2022-04-13 MED ORDER — DAPAGLIFLOZIN PROPANEDIOL 10 MG PO TABS: 10.0000 mg | ORAL_TABLET | Freq: Every day | ORAL | 11 refills | Status: DC

## 2022-04-13 NOTE — Patient Instructions (Addendum)
Good to see you today!  START Farxiga 10 mg daily    Your physician has requested that you have a cardiac MRI. Cardiac MRI uses a computer to create images of your heart as its beating, producing both still and moving pictures of your heart and major blood vessels. For further information please visit [email or web address] Please follow the instruction sheet given to you today for more information. Once insurance approves they will call to schedule test  Your physician recommends that you schedule a follow-up appointment in: 3 months( April ) Call office in February to schedule an appointment   If you have any questions or concerns before your next appointment please send [place] a message through [place] or call our office at [phone].    TO LEAVE A MESSAGE FOR THE NURSE SELECT OPTION 2, PLEASE LEAVE A MESSAGE INCLUDING: YOUR NAME DATE OF BIRTH CALL BACK NUMBER REASON FOR CALL**this is important as we prioritize the call backs  YOU WILL RECEIVE A CALL BACK THE SAME DAY AS LONG AS YOU CALL BEFORE 4:00 PM  At the [facility], you and your health needs are our priority. As part of our continuing mission to provide you with exceptional heart care, we have created designated Provider Care Teams. These Care Teams include your primary Cardiologist (physician) and Advanced Practice Providers (APPs- Physician Assistants and Nurse Practitioners) who all work together to provide you with the care you need, when you need it.   You may see any of the following providers on your designated Care Team at your next follow up: Dr Glori Bickers Dr Loralie Champagne Dr. Roxana Hires, NP Lyda Jester, [facility] [place], [place] Forestine Na, NP Audry Riles, PharmD   Please be sure to bring in all your medications bottles to every appointment.

## 2022-04-13 NOTE — Progress Notes (Signed)
ADVANCED HEART FAILURE CLINIC NOTE  Referring Physician: Velna Ochs, MD  Primary Care: Velna Ochs, MD Primary Cardiologist: Phineas Inches, MD  HPI: Tina Lara is a 67 y.o. female with history of HTN, T2DM, CKDIIIB, obesity and HFpEF who presents for initial visit for further evaluation and treatment of heart failure/cardiomyopathy. Tina Lara states her cardiac history dates back to November 2023. She reports becoming dyspneic with progressively worsening LE edema over the weeks preceding 11/23. She was admitted to [facility] where TTE demonstrated LVEF 25%-30% and moderately reduced RV function. LHC w/ nonobstructive CAD and RHC with cardiac output of 9.5L/min. Since that time she has been seen in [facility] and Dr. Nelly [place] office. Her symptoms have improved to NYHA II; reports compliance to medications with significant improvement in her breathing and LE edema.   Activity level/exercise tolerance:  NYHA II Orthopnea:  Sleeps on 2 pillows Paroxysmal noctural dyspnea:  minimal Chest pain/pressure:  no Orthostatic lightheadedness:  no Palpitations:  no Lower extremity edema:  yes, 1+ Presyncope/syncope:  no Cough:  no  Past Medical History:  Diagnosis Date   Anxiety    Cataract    NS OD   Chest pain 98/92/1194   Complication of anesthesia    Per patient "needs a lot of anesthesia w/surgery" ,I had toruble with a spinal block not taking "   Diabetes mellitus without complication (HCC)    Type 2   Diabetic retinopathy ([place])    BDR   Full dentures    GERD (gastroesophageal reflux disease)    Heart failure ([place])    History of surgery on arm    right arm broken   Hyperlipidemia    Hypertension    never diagnosed by physician per pt   Macular degeneration    Obesity     Current Outpatient Medications  Medication Sig Dispense Refill   acetaminophen (TYLENOL) 500 MG tablet Take 1,500 mg by mouth every 6 (six) hours as needed for moderate pain or headache.       aspirin EC 81 MG tablet Take 1 tablet (81 mg total) by mouth daily. 90 tablet 3   atorvastatin (LIPITOR) 80 MG tablet Take 1 tablet (80 mg total) by mouth daily. 1 Tablet Daily 90 tablet 11   carvedilol (COREG) 6.25 MG tablet Take 1 tablet (6.25 mg total) by mouth 2 (two) times daily with a meal. 180 tablet 3   dicyclomine (BENTYL) 20 MG tablet Take 1 tablet (20 mg total) by mouth 2 (two) times daily as needed for spasms (abdominal pain). 20 tablet 0   ferrous sulfate 325 (65 FE) MG tablet Take 1 tablet (325 mg total) by mouth every other day. 15 tablet 3   lactulose (CHRONULAC) 10 GM/15ML solution TAKE 15 MLS BY MOUTH TWICE DAILY AS NEEDED FOR MILD CONSTIPATION 236 mL 2   Menthol, Topical Analgesic, (BIOFREEZE EX) Apply 1 application  topically daily as needed (back pain).     sacubitril-valsartan (ENTRESTO) 24-26 MG Take 1 tablet by mouth 2 (two) times daily. 60 tablet 2   spironolactone ([name]) 25 MG tablet Take 1 tablet (25 mg total) by mouth daily. 90 tablet 3   torsemide (DEMADEX) 20 MG tablet Take 2 tablets (40 mg total) by mouth daily. 180 tablet 0   Semaglutide,0.25 or 0.'5MG'$ /DOS, 2 MG/3ML SOPN Inject 0.25 mg into the skin once a week. (Patient not taking: Reported on 03/21/2022) 3 mL 2   No current facility-administered medications for this encounter.  Allergies  Allergen Reactions   Percocet [Oxycodone-Acetaminophen] Itching      Social History   Socioeconomic History   Marital status: Divorced    Spouse name: Not on file   Number of children: 1   Years of education: Not on file   Highest education level: High school graduate  Occupational History   Occupation: disability  Tobacco Use   Smoking status: Former    Packs/day: 0.25    Years: 6.00    Total pack years: 1.50    Types: Cigarettes   Smokeless tobacco: Never   Tobacco comments:    quit 36 yrs ago  Vaping Use   Vaping Use: Never used  Substance and Sexual Activity   Alcohol use: Yes    Comment:  occasionally   Drug use: No   Sexual activity: Not Currently    Birth control/protection: None  Other Topics Concern   Not on file  Social History Narrative   Not on file   Social Determinants of Health   Financial Resource Strain: High Risk (02/26/2022)   Overall Financial Resource Strain (CARDIA)    Difficulty of Paying Living Expenses: Very hard  Food Insecurity: No Food Insecurity (02/21/2022)   Hunger Vital Sign    Worried About Running Out of Food in the Last Year: Never true    [place] in the Last Year: Never true  Recent Concern: [place] Present (02/14/2022)   Hunger Vital Sign    Worried About [place] in the Last Year: Sometimes true    Ran Out of Food in the Last Year: Sometimes true  Transportation Needs: No Transportation Needs (03/01/2022)   PRAPARE - Hydrologist (Medical): No    Lack of Transportation (Non-Medical): No  Physical Activity: Inactive (02/14/2022)   Exercise Vital Sign    Days of Exercise per Week: 0 days    Minutes of Exercise per Session: 0 min  Stress: No Stress Concern Present (02/14/2022)   [place]    Feeling of Stress : Not at all  Social Connections: Moderately Isolated (02/14/2022)   Social Connection and Isolation Panel [NHANES]    Frequency of Communication with Friends and Family: Twice a week    Frequency of Social Gatherings with Friends and Family: Once a week    Attends Religious Services: More than 4 times per year    Active Member of [employer] or Organizations: No    Attends Archivist Meetings: Never    Marital Status: Divorced  Human resources officer Violence: Not At Risk (02/21/2022)   Humiliation, Afraid, Rape, and Kick questionnaire    Fear of Current or Ex-Partner: No    Emotionally Abused: No    Physically Abused: No    Sexually Abused: No      Family History  Problem  Relation Age of Onset   Kidney disease Mother    Heart disease Mother    Diabetes Mother    Seizures Father    Heart disease Father    Other Father    Breast cancer Neg Hx     PHYSICAL EXAM: Vitals:   04/13/22 0851  BP: 124/70  Pulse: 84  SpO2: 100%   GENERAL: Well nourished, well developed, and in no apparent distress at rest.  HEENT: Negative for arcus senilis or xanthelasma. There is no scleral icterus.  The mucous membranes are pink and moist.   NECK:  Supple, No masses. Normal carotid upstrokes without bruits. No masses or thyromegaly.    CHEST: There are no chest wall deformities. There is no chest wall tenderness. Respirations are unlabored.  Lungs- CTA B/L CARDIAC:  JVP: 8 cm H2O         Normal S1, S2  Normal rate with regular rhythm. No murmurs, rubs or gallops.  Pulses are 2+ and symmetrical in upper and lower extremities. 1+ edema.  ABDOMEN: Soft, non-tender, non-distended. There are no masses or hepatomegaly. There are normal bowel sounds.  EXTREMITIES: Warm and well perfused with no cyanosis, clubbing.  LYMPHATIC: No axillary or supraclavicular lymphadenopathy.  NEUROLOGIC: Patient is oriented x3 with no focal or lateralizing neurologic deficits.  PSYCH: Patients affect is appropriate, there is no evidence of anxiety or depression.  SKIN: Warm and dry; no lesions or wounds.   DATA REVIEW  ECG: 02/21/22: NSR with PVCs  ECHO: 02/27/22: LVEF 30-35%, RV function mildly reduced with moderately dilated RV cavity.  02/07/22: LVEF 25%-30%; grade III DD.   CATH: 02/23/22: Left heart failure with high cardiac output 9.46 L/min with LVEDP 21 mmHg. Mild pulmonary hypertension with mean PA pressure 34 mmHg.  Pulmonary capillary wedge pressure mean 23 mmHg; pulmonary vascular resistance 1.16 [place] units.  WHO group 2 pulmonary hypertension etiology. Left dominant coronary anatomy.  No obstructive disease.  Mid LAD 40 to 50%.   ASSESSMENT & PLAN:  Heart failure with  reduced EF Etiology of KZ:SWFUXNATFTD cardiomyopathy; LHC noted above. Interestingly, Tina Lara has high outputs on RHC (9.5L) with a cardiac index of 3.7 almost meeting criteria for high output heart failure. I suspect her high outputs are secondary to obesity and possible cirrhosis. Will obtain a TTE w/ bubble study to assess for shunting. In addition, will obtain CMR.  NYHA class / AHA Stage:IIB Volume status & Diuretics: Euvolemic, torsemide 20-'40mg'$  daily Vasodilators:Entresto 24/'26mg'$  BID Beta-Blocker:coreg 6.'25mg'$  BID DUK:[id number] '25mg'$  daily Cardiometabolic:farxiga '10mg'$  Devices therapies & Valvulopathies:not indicated Advanced therapies:not indicated; see above.   2. 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 Likely secondary to underlying CKD or cirrhosis.   3. CKDIIIB - Start farxiga '10mg'$  daily  4. Nonobstructive CAD - ASA/statin  5. Obesity - BMI of 44 - discussed importance of continued weight loss.   Tina Lara Advanced Heart Failure Mechanical Circulatory Support

## 2022-04-16 NOTE — Progress Notes (Signed)
04/18/2022 Tina Lara [id number] 03/26/1956  Referring provider: Velna Ochs, MD Primary GI doctor: Dr. Hilarie Fredrickson  ASSESSMENT AND PLAN:  Iron deficiency anemia, unspecified iron deficiency anemia type Continue iron QOD, will plan on variceal screening and colonoscopy in the hospital due to EF 30-35%, will need to call back with Dr. Vena Rua first available I recommend upper gastrointestinal and colorectal evaluation with an EGD and colonoscopy.  Risk of bowel prep, conscious sedation, and EGD and colonoscopy were discussed.  Risks include but are not limited to dehydration, pain, bleeding, cardiopulmonary process, bowel perforation, or other possible adverse outcomes..  Treatment plan was discussed with patient, and agreed upon.  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  [place] screen due 07/2022 No ascites on exam, continue torsemide No asterixis on exam, check ammonia, continues lactulose, aim for one BM daily Check INR, calculate MELD but appears compensated at this time No history of variceal screen, normal platelets and no evidence of portal HTN -Nutrition and low sodium diet discussed with patient and information given -Continue daily multivitamin -Recommended 30 minutes of aerobic and resistance exercise 3 days/week  Chronic HFrEF (heart failure with reduced ejection fraction) ([place]) Cath with non obstructive plaque Echo EF 30-35%, PHTN   Morbid obesity (HCC) From BMI 51 to 43, continue weight loss.   History of adenomatous polyp of colon 09/30/2017 colonoscopy Dr. Hilarie Fredrickson 2 polyps 4 to 5 mm in size transverse colon, one 5 mm polyp descending colon, inflamed sigmoid diverticulum, mild diverticulosis sigmoid, descending and ascending.  2 polyps precancerous, benign inflamed diverticulum, Due 10/2022  with new anemia will schedule sooner at hospital with EGD   Patient Care Team: Velna Ochs, MD as PCP - General (Internal Medicine) Harl Bowie Royetta Crochet, MD as PCP - Cardiology (Cardiology) Gevena Cotton, MD as Consulting Physician (Ophthalmology)  HISTORY OF PRESENT ILLNESS: 67 y.o. female with a past medical history of obesity BMI 81, diabetes, hypertension, GERD, cirrhosis and others listed below presents for evaluation of possible cirrhosis in setting of acute systolic heart failure.  09/30/2017 colonoscopy Dr. Hilarie Fredrickson 2 polyps 4 to 5 mm in size transverse colon, one 5 mm polyp descending colon, inflamed sigmoid diverticulum, mild diverticulosis sigmoid, descending and ascending.  2 polyps precancerous, benign inflamed diverticulum, recall 5 years due 10/2022 01/28/2022 ER visit shortness of breath and orthopnea for a week, leg swelling.  CTA negative for PE but possible cirrhosis. Given Lasix and followed up with cardiology findings subsequent acute systolic heart failure.  Polyclonal gammopathy, following up with hematology. 01/28/2022 CT and pelvis with contrast showed limiting study due to body habitus, mildly nodular contour of liver, small amount of perihepatic and perisplenic fluid increase attenuation intra and extra abdominal fat diffusely with skin thickening in the anterior abdomen nonspecific for volume overload, colon diverticulosis.  Normal spleen 02/23/2022 cardiac catheterization left and right left heart failure with cardiac output 9.46 L/min with LVEDP 21 mmHg, mild pulmonary hypertension mean PA pressure 34 nonobstructive disease mid LAD 40 to 50% 02/27/2022 EF 30 to 35% moderately decreased function, grade 2 diastolic dysfunction normal valves other than some TR 03/13/2022 ultrasound elastography shows median K PA 2.7, hepatic contour minimally nodular/irregular suggesting cirrhosis, no focal hepatic mass.    Patient presents for possible cirrhosis and iron def anemia She  is on an iron pill every other day,  she was anemic in 11/08, has been on iron every other day with improvement of her HGB and her iron/ferritin.  Recent Labs    02/07/22 1112 02/21/22 1426 02/22/22 0027 02/22/22 1420 02/23/22 0035 02/23/22 0858 02/23/22 0901 02/24/22 0048 02/25/22 0050 02/27/22 0056  HGB 10.7* 11.2* 10.8* 11.5* 11.6* 12.9  12.9 12.6 12.3 12.4 13.0   She has never had an EGD.  Due for colonoscopy 10/2022. She has BM every other day, no melena, no hematochezia.  There is not evidence of portal hypertension.  Patient does not have history of encephalopathy. She was switched from OTC constipation meds to lactulose, has not taken it in the last 3 weeks.  She complains of swelling, has had significant weight loss and improvement with torsemide 40 mg daily, she is not on spironolactone.  She has only had 3 doses of the ozempic, last one was Nov.  Wt Readings from Last 3 Encounters:  04/18/22 295 lb 9.6 oz (134.1 kg)  04/13/22 299 lb (135.6 kg)  03/21/22 (!) 302 lb (137 kg)   BMI Readings from Last 1 Encounters:  04/18/22 43.65 kg/m   Last HCC screen: 01/28/2022 CT Last AFP none Last INR: 02/22/2022 1.4  Hepatitis immunity status:  03/07/2022 HepBsAG Negative   01/15/2022 HepCAB Negative Unknown immune status  She has had normal iron and ferritin, she is on iron supplement every other day Mon with dialysis when she passed, no family history of liver issues.   Social history:  Patient denies history of injectable or intranasal drug use, tattoos, high risk sexual behavior, blood transfusion, incarceration, Armed forces logistics/support/administrative officer, or travel outside the [place].  She reports ETOH use once or twice a day.    She  reports that she has quit smoking. Her smoking use included cigarettes. She has a 1.50 pack-year smoking history. She has never used smokeless tobacco. She reports current alcohol use. She reports that she does not use drugs.  Current Medications:    Current Outpatient Medications (Endocrine & Metabolic):    dapagliflozin propanediol (FARXIGA) 10 MG TABS tablet, Take 1 tablet (10 mg total) by mouth daily before breakfast.   Semaglutide,0.25 or 0.'5MG'$ /DOS, 2 MG/3ML SOPN, Inject 0.25 mg into the skin once a week. (Patient not taking: Reported on 03/21/2022)  Current Outpatient Medications (Cardiovascular):    carvedilol (COREG) 6.25 MG tablet, Take 1 tablet (6.25 mg total) by mouth 2 (two) times daily with a meal.   sacubitril-valsartan (ENTRESTO) 24-26 MG, Take 1 tablet by mouth 2 (two) times daily.   spironolactone ([name]) 25 MG tablet, Take 1 tablet (25 mg total) by mouth daily.   torsemide (DEMADEX) 20 MG tablet, Take 2 tablets (40 mg total) by mouth daily.   Current Outpatient Medications (Analgesics):    acetaminophen (TYLENOL) 500 MG tablet, Take 1,500 mg by mouth every 6 (six) hours as needed for moderate pain or headache.    aspirin EC 81 MG tablet, Take 1 tablet (81 mg total) by mouth daily.  Current Outpatient Medications (Hematological):    ferrous sulfate 325 (65 FE) MG tablet, Take 1 tablet (325 mg total) by mouth every other day.  Current Outpatient Medications (Other):    lactulose (CHRONULAC) 10 GM/15ML solution, TAKE 15 MLS BY MOUTH TWICE DAILY AS NEEDED FOR MILD CONSTIPATION   Menthol, Topical Analgesic, (BIOFREEZE EX), Apply 1 application  topically daily as needed (back pain).   dicyclomine (BENTYL) 20 MG tablet, Take 1 tablet (20 mg total) by mouth 2 (two) times daily as needed  for spasms (abdominal pain). 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   Full dentures    GERD (gastroesophageal reflux disease)    Heart failure ([place])    History of surgery on arm    right arm broken   Hyperlipidemia    Hypertension    never diagnosed by physician per pt   Macular degeneration    Obesity    Allergies:  Allergies  Allergen Reactions   Percocet [Oxycodone-Acetaminophen] Itching     Surgical History:  She  has a past surgical history that includes Cesarean section (1989); Tonsillectomy; Multiple tooth extractions; Cholecystectomy; Dilation and curettage of uterus (N/A, 09/10/2017); Cervical conization w/bx (N/A, 09/10/2017); Colonoscopy with propofol (N/A, 09/30/2017); biopsy (09/30/2017); polypectomy (09/30/2017); and RIGHT/LEFT HEART CATH AND CORONARY ANGIOGRAPHY (N/A, 02/23/2022). Family History:  Her family history includes Diabetes in her mother; Heart disease in her father and mother; Kidney disease in her mother; Other in her father; Seizures in her father.  REVIEW OF SYSTEMS  : All other systems reviewed and negative except where noted in the History of Present Illness.  PHYSICAL EXAM: BP 110/70   Pulse 72   Ht '5\' 9"'$  (1.753 m)   Wt 295 lb 9.6 oz (134.1 kg)   BMI 43.65 kg/m  General :  Alert, well developed female in no acute distress Head:  Normocephalic and atraumatic. Eyes :  sclerae anicteric,conjunctive pink  Heart:  regular rate and rhythm, no murmurs or gallops Pulm:  Clear anteriorly; no wheezing Abdomen:   Soft, Obese AB, skin exam normal, Normal bowel sounds.  no  tenderness. No fluid wave, no shifting dullness.  Extremities:   Without edema. Msk:  Symmetrical without gross deformities. Peripheral pulses intact.  Neurologic: Alert and  oriented x4;  grossly normal neurologically. without asterixis or clonus.  Skin:   without jaundice. Psychiatric:  Demonstrates good judgement and reason without abnormal affect or behaviors.   RELEVANT LABS AND IMAGING: CBC    Component Value Date/Time    WBC 3.5 (L) 02/27/2022 0056   RBC 5.36 (H) 02/27/2022 0056   HGB 13.0 02/27/2022 0056   HGB 10.7 (L) 02/07/2022 1112   HCT 44.3 02/27/2022 0056   HCT 35.7 02/07/2022 1112   PLT 196 02/27/2022 0056   PLT 211 02/07/2022 1112   MCV 82.6 02/27/2022 0056   MCV 78 (L) 02/07/2022 1112   MCH 24.3 (L) 02/27/2022 0056   MCHC 29.3 (L) 02/27/2022 0056   RDW 17.2 (H) 02/27/2022 0056   RDW 15.0 02/07/2022 1112   LYMPHSABS 0.6 (L) 02/27/2022 0056   MONOABS 0.6 02/27/2022 0056   EOSABS 0.1 02/27/2022 0056   BASOSABS 0.0 02/27/2022 0056    CMP     Component Value Date/Time   NA 136 03/07/2022 0856   K 4.3 03/07/2022 0856   CL 96 03/07/2022 0856   CO2 23 03/07/2022 0856   GLUCOSE 174 (H) 03/07/2022 0856   GLUCOSE 153 (H) 02/27/2022 0056   BUN 43 (H) 03/07/2022 0856   CREATININE 1.77 (H) 03/07/2022  0856   CALCIUM 9.6 03/07/2022 0856   PROT 8.6 (H) 03/07/2022 0856   ALBUMIN 4.1 03/07/2022 0856   AST 38 03/07/2022 0856   ALT 34 (H) 03/07/2022 0856   ALKPHOS 133 (H) 03/07/2022 0856   BILITOT 0.5 03/07/2022 0856   GFRNONAA 34 (L) 02/27/2022 0056   GFRAA 73 05/13/2019 0911     Vladimir Crofts, PA-C 12:05 PM

## 2022-04-17 ENCOUNTER — Telehealth: Payer: Self-pay

## 2022-04-17 NOTE — Patient Outreach (Signed)
  Care Coordination   04/17/2022 Name: Tina Lara MRN: [id number] DOB: [birth year]   Care Coordination Outreach Attempts:  An unsuccessful telephone outreach was attempted today to offer the patient information about available care coordination services as a benefit of their health plan.   Follow Up Plan:  Additional outreach attempts will be made to offer the patient care coordination information and services.   Encounter Outcome:  No Answer   Care Coordination Interventions:  No, not indicated    Lazaro Arms RN, BSN, [facility]   Phone: [phone]

## 2022-04-18 ENCOUNTER — Other Ambulatory Visit (INDEPENDENT_AMBULATORY_CARE_PROVIDER_SITE_OTHER): Payer: Medicare Other

## 2022-04-18 ENCOUNTER — Encounter: Payer: Self-pay | Admitting: Physician Assistant

## 2022-04-18 ENCOUNTER — Ambulatory Visit (INDEPENDENT_AMBULATORY_CARE_PROVIDER_SITE_OTHER): Payer: Medicare Other | Admitting: Physician Assistant

## 2022-04-18 VITALS — BP 110/70 | HR 72 | Ht 69.0 in | Wt 295.6 lb

## 2022-04-18 DIAGNOSIS — Z860101 Personal history of adenomatous and serrated colon polyps: Secondary | ICD-10-CM

## 2022-04-18 DIAGNOSIS — I5022 Chronic systolic (congestive) heart failure: Secondary | ICD-10-CM | POA: Diagnosis not present

## 2022-04-18 DIAGNOSIS — R188 Other ascites: Secondary | ICD-10-CM | POA: Diagnosis not present

## 2022-04-18 DIAGNOSIS — K7460 Unspecified cirrhosis of liver: Secondary | ICD-10-CM | POA: Diagnosis not present

## 2022-04-18 DIAGNOSIS — Z1159 Encounter for screening for other viral diseases: Secondary | ICD-10-CM

## 2022-04-18 DIAGNOSIS — Z86010 Personal history of colonic polyps: Secondary | ICD-10-CM | POA: Diagnosis not present

## 2022-04-18 DIAGNOSIS — D509 Iron deficiency anemia, unspecified: Secondary | ICD-10-CM | POA: Diagnosis not present

## 2022-04-18 DIAGNOSIS — R7401 Elevation of levels of liver transaminase levels: Secondary | ICD-10-CM | POA: Diagnosis not present

## 2022-04-18 LAB — COMPREHENSIVE METABOLIC PANEL
ALT: 26 U/L (ref 0–35)
AST: 27 U/L (ref 0–37)
Albumin: 4.1 g/dL (ref 3.5–5.2)
Alkaline Phosphatase: 109 U/L (ref 39–117)
BUN: 43 mg/dL — ABNORMAL HIGH (ref 6–23)
CO2: 31 mEq/L (ref 19–32)
Calcium: 10.2 mg/dL (ref 8.4–10.5)
Chloride: 95 mEq/L — ABNORMAL LOW (ref 96–112)
Creatinine, Ser: 1.87 mg/dL — ABNORMAL HIGH (ref 0.40–1.20)
GFR: 28.16 mL/min — ABNORMAL LOW (ref >60.00)
Glucose, Bld: 158 mg/dL — ABNORMAL HIGH (ref 70–99)
Potassium: 4 mEq/L (ref 3.5–5.1)
Sodium: 135 mEq/L (ref 135–145)
Total Bilirubin: 0.6 mg/dL (ref 0.2–1.2)
Total Protein: 9.6 g/dL — ABNORMAL HIGH (ref 6.0–8.3)

## 2022-04-18 LAB — AMMONIA: Ammonia: 30 umol/L (ref 11–35)

## 2022-04-18 LAB — CBC WITH DIFFERENTIAL/PLATELET
Basophils Absolute: 0 10*3/uL (ref 0.0–0.1)
Basophils Relative: 0.5 % (ref 0.0–3.0)
Eosinophils Absolute: 0.1 10*3/uL (ref 0.0–0.7)
Eosinophils Relative: 2.7 % (ref 0.0–5.0)
HCT: 46.8 % — ABNORMAL HIGH (ref 36.0–46.0)
Hemoglobin: 15.2 g/dL — ABNORMAL HIGH (ref 12.0–15.0)
Lymphocytes Relative: 14.4 % (ref 12.0–46.0)
Lymphs Abs: 0.5 10*3/uL — ABNORMAL LOW (ref 0.7–4.0)
MCHC: 32.4 g/dL (ref 30.0–36.0)
MCV: 82.3 fl (ref 78.0–100.0)
Monocytes Absolute: 0.6 10*3/uL (ref 0.1–1.0)
Monocytes Relative: 14.6 % — ABNORMAL HIGH (ref 3.0–12.0)
Neutro Abs: 2.6 10*3/uL (ref 1.4–7.7)
Neutrophils Relative %: 67.8 % (ref 43.0–77.0)
Platelets: 174 10*3/uL (ref 150.0–400.0)
RBC: 5.69 Mil/uL — ABNORMAL HIGH (ref 3.87–5.11)
RDW: 19.8 % — ABNORMAL HIGH (ref 11.5–15.5)
WBC: 3.8 10*3/uL — ABNORMAL LOW (ref 4.0–10.5)

## 2022-04-18 LAB — PROTIME-INR
INR: 1.1 ratio — ABNORMAL HIGH (ref 0.8–1.0)
Prothrombin Time: 11.8 s (ref 9.6–13.1)

## 2022-04-18 NOTE — Patient Instructions (Addendum)
Your provider has requested that you go to the basement level for lab work before leaving today. Press "B" on the elevator. The lab is located at the first door on the left as you exit the elevator.   TYLENOL (ACETAMINOPHEN) IS SAFE IN LIVER DISEASE: You can take tylenol (acetaminophen) up to 2,000 mg/day. This would be four extra strength ('500mg'$ ) tablets over 24 hours OR six regular strength (325 mg) tablets over 24 hours. Please be sure to read the ingredients of over the counter medications and prescription pain medications as many contain acetaminophen.   NO NSAIDS (ibuprofen, advil, naproxen, aleve, motrin...)  HEPATIC ENCEPHALOPATHY HEPATIC ENCEPHALOPATHY: Confusion caused by a build up of toxins in the blood due to the liver not being able to filter toxins. This can cause confusion mild or severe, increase falls. If you have been diagnosed with Hepatic encephalopathy, advised to not drive due to increased risk   LACTULOSE:  helps pull ammonia and other toxins from the blood into your stool when you have a bowel movement NO NEED TO CHECK AMMONIA LEVEL IN BLOOD! Only need to check if you are having symptoms. 30-1m up to four times a day Take a dose in the morning If by lunch time you have not had AT LEAST 2 bowel movements take another dose If by dinner you have not had AT LEAST 2 bowel movements that day take another dose If by bedtime you still have not had 2 bowel movements take another dose Goal of 3-4 bowel movements per day Avoid taking with food as this will cause more gas  IF YOU ARE VERY SLEEPY, HARD FOR YOUR FAMILY TO WAKE YOU UP, OR FALLING ASLEEP DURING CONVERSATIONS -INCREASE LACTULOSE/GO TO THE EMERGENCY ROOM  XIFAXAN (rifaximin) An antibiotic that helps limit excess bacteria in the intestine.  The bacteria can cause increased ammonia One '550mg'$  tablet twice daily  SUPPLEMENTS: multivitamin Zinc Menser Chain Amino acids (BCAA): 12 grams/day L-Ornithine  L-Aspartate (LOLA): 6 grams three times/day ([email or web address]-[email or web address]/ L-Carnitine  PHYSICAL ACTIVITY It is important to continue to be active when you have cirrhosis. Exercise will help reduce muscle loss and weakness.  DISCUSS REFERRAL FOR PHYSICAL THERAPY WITH YOUR PRIMARY CARE PROVIDER  DIET/NUTRITION FOR CIRRHOSIS NO ALCOHOL YOUR GOALS Evening snack - high protein Supplements between meals to help meet calorie and protein goal: Boost Ensure Premier Protein Shakes Protein Greek yogurt Fish, chicken (NO RAW OR UNDERCOOKED FISH/SHELLFISH) Avoid pork and red meat Plant based protein (non-soy)/Vegan: Lentils, Chickpeas, Peanuts (non salted), almonds (non salted), quinoa, chia seeds  Plant based protein supplements (not soy)  Avoid/limit animal based protein supplements: whey, casein 4.  Low sodium (2,000 mg/day) A. Avoid: table salt, canned foods, deli meats, sausages, hot dogs, anything with a long shelf life B. Read nutrition labels and be aware of serving size. Don't go by percent of daily value.   You will be contacted when Dr. PHilarie [facility]onhas [facility].  It may not be until March or April.  Please follow up with Dr. PHilarie Fredricksonin 3 to 6 months.  The schedule is not open yet.  Please call in March to schedule an appointment in May: [phone]  Thank you for entrusting me with your care and for choosing [facility], AVicie Mutters P.A.-C

## 2022-04-19 ENCOUNTER — Telehealth: Payer: Self-pay

## 2022-04-19 ENCOUNTER — Telehealth (HOSPITAL_COMMUNITY): Payer: Self-pay

## 2022-04-20 LAB — HEPATITIS B SURFACE ANTIBODY,QUALITATIVE: Hep B S Ab: NONREACTIVE

## 2022-04-20 LAB — HEPATITIS B CORE ANTIBODY, TOTAL: Hep B Core Total Ab: NONREACTIVE

## 2022-04-20 LAB — HEPATITIS A ANTIBODY, TOTAL: Hepatitis A AB,Total: REACTIVE — AB

## 2022-04-20 LAB — IGG: IgG (Immunoglobin G), Serum: 3036 mg/dL — ABNORMAL HIGH (ref 600–1540)

## 2022-04-20 LAB — AFP TUMOR MARKER: AFP-Tumor Marker: 3.1 ng/mL

## 2022-04-20 LAB — ANTI-SMOOTH MUSCLE ANTIBODY, IGG: Actin (Smooth Muscle) Antibody (IGG): 22 U — ABNORMAL HIGH (ref <20)

## 2022-04-20 LAB — ANA: Anti Nuclear Antibody (ANA): NEGATIVE

## 2022-04-20 LAB — MITOCHONDRIAL ANTIBODIES: Mitochondrial M2 Ab, IgG: <=20 U (ref <=20.0)

## 2022-04-20 NOTE — Patient Outreach (Signed)
  Care Coordination   Follow Up Visit Note   04/19/2022 Name: Tina Lara MRN: [id number] DOB: January 26, 1956  Tina Lara is a 67 y.o. year old female who sees Tina Ochs, MD for primary care. I spoke with  [name] by phone today.  What matters to the patients health and wellness today?  I don't understand the cost of my medication    Goals Addressed             This Visit's Progress    COMPLETED: I don''t understand my medication cost       Care Coordination Interventions:  Active listening / Reflection utilized  Emotional Support Provided Problem [place] strategies reviewed I received a call from Tina Lara who needed help with her medications, specifically with Farxiga 10 mg. She mentioned that her pharmay informed her that the copay for the medication would be $100 for a 30 day supply. She also had a query as to why she couldn't get her Ozempic pen before January 24th. We contacted [facility] and spoke to Tina Lara at [phone], patient insurance ID # [id number] given. Tina Lara explained that the [place] medication had a copay of $100 for a hundred-day prescription, and that Tina Lara was unable to get the Ozempic pen because she had already received a pen in December. Tina Lara had mistakenly thought that she had picked up her last pen in October. Tina Lara informed her that she would reach out to her pharmacy and clarify the information. Tina Lara appreciated the help.         SDOH assessments and interventions completed:  No     Care Coordination Interventions:  Yes, provided   Follow up plan: Follow up call scheduled for 05/22/22 130 pm    Encounter Outcome:  Pt. Visit Completed   Tina Arms RN, BSN, [facility]   Phone: [phone]

## 2022-04-20 NOTE — Patient Instructions (Signed)
Visit Information  Thank you for taking time to visit with me today. Please don't hesitate to contact me if I can be of assistance to you.   Following are the goals we discussed today:   Goals Addressed             This Visit's Progress    COMPLETED: I don''t understand my medication cost       Care Coordination Interventions:  Active listening / Reflection utilized  Emotional Support Provided Problem [place] strategies reviewed I received a call from Mrs. Golla who needed help with her medications, specifically with Farxiga 10 mg. She mentioned that her pharmay informed her that the copay for the medication would be $100 for a 30 day supply. She also had a query as to why she couldn't get her Ozempic pen before January 24th. We contacted [facility] and spoke to [address] at [phone], patient insurance ID # [id number] given. Rayann Heman explained that the [place] medication had a copay of $100 for a hundred-day prescription, and that Mrs. Manukyan was unable to get the Ozempic pen because she had already received a pen in December. Mrs. Nebergall had mistakenly thought that she had picked up her last pen in October. Rein informed her that she would reach out to her pharmacy and clarify the information. Mrs. Manka appreciated the help.         Our next appointment is by telephone on 05/22/22 at 130 pm  Please call the care guide team at [phone] if you need to cancel or reschedule your appointment.   If you are experiencing a Mental Health or [place] or need someone to talk to, please call 1-800-273-TALK (toll free, 24 hour hotline)   Patient verbalizes understanding of instructions and care plan provided today and agrees to view in [place]. Active MyChart status and patient understanding of how to access instructions and care plan via MyChart confirmed with patient.       Lazaro Arms RN, BSN, [facility]   Phone: [phone]

## 2022-04-20 NOTE — Progress Notes (Signed)
[place]   [phone] [phone]   Clinic New consult Note   Patient Care Team: Velna Ochs, MD as PCP - General (Internal Medicine) Janina Mayo, MD as PCP - Cardiology (Cardiology) Gevena Cotton, MD as Consulting Physician (Ophthalmology) 04/21/2022  CHIEF COMPLAINTS/PURPOSE OF CONSULTATION:  Abnormal SPEP   REFERRAL PHYSICIAL: Velna Ochs, MD   HISTORY OF PRESENTING ILLNESS:  Tina Lara 67 y.o. female is here because of abnormal SPEP test result.  She was referred by her primary care physician.  She presents to clinic by herself.  She had a history of iron deficiency in the past, and she has been taking oral iron.  She was admitted to hospital in November 2023 for congestive heart failure, and she was also found liver cirrhosis at that time.  She was treated with diuretics, lost about 40 pounds, and feels much better.  She is currently being evaluated by GI Dr. Hilarie Fredrickson, for the etiology of her liver cirrhosis.  She has a follow-up appointment with them to discuss the lab results from earlier this week.  She had some lab workup in November 2023, which included SPEP with immunofixation.  It showed increased gammaglobulin, M protein was not detected, the immunofixation showed polyclonal gammopathy.  She was referred for further evaluation.  Patient has multiple comorbidities, including hypertension, diabetes, GERD, congestive heart failure with EF 30 to 35%.  She is on medication for the above medical conditions.  Since her last hospital stay, she overall feels better, she denies any dyspnea, pain, nausea, or other symptoms.   MEDICAL HISTORY:  Past Medical History:  Diagnosis Date   Anxiety    Cataract    NS OD   Chest pain 45/40/9811   Chronic systolic heart failure (HCC)    Complication of anesthesia    Per patient "needs a lot of anesthesia w/surgery" ,I had toruble with a spinal block not taking "   Diabetes  mellitus without complication (HCC)    Type 2   Diabetic retinopathy (HCC)    BDR   Full dentures    GERD (gastroesophageal reflux disease)    Heart failure ([place])    History of surgery on arm    right arm broken   Hyperlipidemia    Hypertension    never diagnosed by physician per pt   Macular degeneration    Obesity     SURGICAL HISTORY: Past Surgical History:  Procedure Laterality Date   BIOPSY  09/30/2017   Procedure: BIOPSY;  Surgeon: Jerene Bears, MD;  Location: [facility];  Service: Gastroenterology;;   CERVICAL CONIZATION W/BX N/A 09/10/2017   Procedure: CONIZATION CERVIX WITH BIOPSY - COLD KNIFE;  Surgeon: Emily Filbert, MD;  Location: [facility];  Service: Gynecology;  Laterality: N/A;   CESAREAN SECTION  1989   x 1   CHOLECYSTECTOMY     gall stones   COLONOSCOPY WITH PROPOFOL N/A 09/30/2017   Procedure: COLONOSCOPY WITH PROPOFOL;  Surgeon: Jerene Bears, MD;  Location: [facility];  Service: Gastroenterology;  Laterality: N/A;   DILATION AND CURETTAGE OF UTERUS N/A 09/10/2017   Procedure: DILATATION AND CURETTAGE;  Surgeon: Emily Filbert, MD;  Location: [facility];  Service: Gynecology;  Laterality: N/A;   MULTIPLE TOOTH EXTRACTIONS     dentures upper and lowers   POLYPECTOMY  09/30/2017   Procedure: POLYPECTOMY;  Surgeon: Jerene Bears, MD;  Location: [facility];  Service: Gastroenterology;;   RIGHT/LEFT HEART CATH AND CORONARY ANGIOGRAPHY N/A  02/23/2022   Procedure: RIGHT/LEFT HEART CATH AND CORONARY ANGIOGRAPHY;  Surgeon: Belva Crome, MD;  Location: [facility];  Service: Cardiovascular;  Laterality: N/A;   TONSILLECTOMY      SOCIAL HISTORY: Social History   Socioeconomic History   Marital status: Divorced    Spouse name: Not on file   Number of children: 1   Years of education: Not on file   Highest education level: High school graduate  Occupational History   Occupation: disability  Tobacco Use   Smoking status: Former    Packs/day: 0.25    Years:  6.00    Total pack years: 1.50    Types: Cigarettes   Smokeless tobacco: Never   Tobacco comments:    quit 36 yrs ago  Vaping Use   Vaping Use: Never used  Substance and Sexual Activity   Alcohol use: Yes    Comment: occasionally   Drug use: No   Sexual activity: Not Currently    Birth control/protection: None  Other Topics Concern   Not on file  Social History Narrative   Not on file   Social Determinants of Health   Financial Resource Strain: High Risk (02/26/2022)   Overall Financial Resource Strain (CARDIA)    Difficulty of Paying Living Expenses: Very hard  Food Insecurity: No Food Insecurity (02/21/2022)   Hunger Vital Sign    Worried About Running Out of Food in the Last Year: Never true    [place] in the Last Year: Never true  Recent Concern: [place] Present (02/14/2022)   Hunger Vital Sign    Worried About [place] in the Last Year: Sometimes true    Ran Out of Food in the Last Year: Sometimes true  Transportation Needs: No Transportation Needs (03/01/2022)   PRAPARE - Hydrologist (Medical): No    Lack of Transportation (Non-Medical): No  Physical Activity: Inactive (02/14/2022)   Exercise Vital Sign    Days of Exercise per Week: 0 days    Minutes of Exercise per Session: 0 min  Stress: No Stress Concern Present (02/14/2022)   [place]    Feeling of Stress : Not at all  Social Connections: Moderately Isolated (02/14/2022)   Social Connection and Isolation Panel [NHANES]    Frequency of Communication with Friends and Family: Twice a week    Frequency of Social Gatherings with Friends and Family: Once a week    Attends Religious Services: More than 4 times per year    Active Member of [employer] or Organizations: No    Attends Archivist Meetings: Never    Marital Status: Divorced  Human resources officer Violence: Not At  Risk (02/21/2022)   Humiliation, Afraid, Rape, and Kick questionnaire    Fear of Current or Ex-Partner: No    Emotionally Abused: No    Physically Abused: No    Sexually Abused: No    FAMILY HISTORY: Family History  Problem Relation Age of Onset   Kidney disease Mother    Heart disease Mother    Diabetes Mother    Seizures Father    Heart disease Father    Other Father    Breast cancer Neg Hx     ALLERGIES:  is allergic to percocet [oxycodone-acetaminophen].  MEDICATIONS:  Current Outpatient Medications  Medication Sig Dispense Refill   acetaminophen (TYLENOL) 500 MG tablet Take 1,500 mg by mouth  every 6 (six) hours as needed for moderate pain or headache.      aspirin EC 81 MG tablet Take 1 tablet (81 mg total) by mouth daily. 90 tablet 3   carvedilol (COREG) 6.25 MG tablet Take 1 tablet (6.25 mg total) by mouth 2 (two) times daily with a meal. 180 tablet 3   dapagliflozin propanediol (FARXIGA) 10 MG TABS tablet Take 1 tablet (10 mg total) by mouth daily before breakfast. 30 tablet 11   dicyclomine (BENTYL) 20 MG tablet Take 1 tablet (20 mg total) by mouth 2 (two) times daily as needed for spasms (abdominal pain). (Patient not taking: Reported on 04/18/2022) 20 tablet 0   ferrous sulfate 325 (65 FE) MG tablet Take 1 tablet (325 mg total) by mouth every other day. 15 tablet 3   lactulose (CHRONULAC) 10 GM/15ML solution TAKE 15 MLS BY MOUTH TWICE DAILY AS NEEDED FOR MILD CONSTIPATION 236 mL 2   Menthol, Topical Analgesic, (BIOFREEZE EX) Apply 1 application  topically daily as needed (back pain).     sacubitril-valsartan (ENTRESTO) 24-26 MG Take 1 tablet by mouth 2 (two) times daily. 60 tablet 2   Semaglutide,0.25 or 0.'5MG'$ /DOS, 2 MG/3ML SOPN Inject 0.25 mg into the skin once a week. (Patient not taking: Reported on 03/21/2022) 3 mL 2   spironolactone ([name]) 25 MG tablet Take 1 tablet (25 mg total) by mouth daily. 90 tablet 3   torsemide (DEMADEX) 20 MG tablet Take 2 tablets (40  mg total) by mouth daily. 180 tablet 0   No current facility-administered medications for this visit.    REVIEW OF SYSTEMS:   Constitutional: Denies fevers, chills or abnormal night sweats Eyes: Denies blurriness of vision, double vision or watery eyes Ears, nose, mouth, throat, and face: Denies mucositis or sore throat Respiratory: Denies cough, dyspnea or wheezes Cardiovascular: Denies palpitation, chest discomfort or lower extremity swelling Gastrointestinal:  Denies nausea, heartburn or change in bowel habits Skin: Denies abnormal skin rashes Lymphatics: Denies new lymphadenopathy or easy bruising Neurological:Denies numbness, tingling or new weaknesses Behavioral/Psych: Mood is stable, no new changes  All other systems were reviewed with the patient and are negative.  PHYSICAL EXAMINATION: ECOG PERFORMANCE STATUS: 1 - Symptomatic but completely ambulatory  Vitals:   04/21/22 0740  BP: 124/68  Pulse: 77  Resp: 14  Temp: 97.7 F (36.5 C)  SpO2: 100%   Filed Weights   04/21/22 0740  Weight: 298 lb 14.4 oz (135.6 kg)    GENERAL:alert, no distress and comfortable, obese lady  SKIN: skin color, texture, turgor are normal, no rashes or significant lesions EYES: normal, conjunctiva are pink and non-injected, sclera clear OROPHARYNX:no exudate, no erythema and lips, buccal mucosa, and tongue normal  NECK: supple, thyroid normal size, non-tender, without nodularity LYMPH:  no palpable lymphadenopathy in the cervical, axillary or inguinal LUNGS: clear to auscultation and percussion with normal breathing effort HEART: regular rate & rhythm and no murmurs and no lower extremity edema ABDOMEN:abdomen soft, non-tender and normal bowel sounds Musculoskeletal:no cyanosis of digits and no clubbing  PSYCH: alert & oriented x 3 with fluent speech NEURO: no focal motor/sensory deficits  LABORATORY DATA:  I have reviewed the data as listed    Latest Ref Rng & Units 04/18/2022    12:21 PM 02/27/2022   12:56 AM 02/25/2022   12:50 AM  CBC  WBC 4.0 - 10.5 K/uL 3.8  3.5  2.9   Hemoglobin 12.0 - 15.0 g/dL 15.2  13.0  12.4   Hematocrit  36.0 - 46.0 % 46.8  44.3  43.2   Platelets 150.0 - 400.0 K/uL 174.0  196  211        Latest Ref Rng & Units 04/18/2022   12:21 PM 03/07/2022    8:56 AM 02/27/2022   12:56 AM  CMP  Glucose 70 - 99 mg/dL 158  174  153   BUN 6 - 23 mg/dL 43  43  21   Creatinine 0.40 - 1.20 mg/dL 1.87  1.77  1.69   Sodium 135 - 145 mEq/L 135  136  136   Potassium 3.5 - 5.1 mEq/L 4.0  4.3  3.6   Chloride 96 - 112 mEq/L 95  96  101   CO2 19 - 32 mEq/L '31  23  26   '$ Calcium 8.4 - 10.5 mg/dL 10.2  9.6  9.0   Total Protein 6.0 - 8.3 g/dL 9.6  8.6    Total Bilirubin 0.2 - 1.2 mg/dL 0.6  0.5    Alkaline Phos 39 - 117 U/L 109  133    AST 0 - 37 U/L 27  38    ALT 0 - 35 U/L 26  34       RADIOGRAPHIC STUDIES: I have personally reviewed the radiological images as listed and agreed with the findings in the report. No results found.  ASSESSMENT & PLAN:   Polyclonal gammopathy determined by serum protein electrophoresis -Detected blood test -Since M protein was negative, immunofixation showed polyclonal gammopathy, this is likely related to autoimmune process or chronic inflammation, no suspicion for MGUS or multiple myeloma.  She does not have other clinical suspicion for multiple myeloma. -No further hematological workup needed.  Iron deficiency anemia -Diagnosed around 2018.  With mild anemia -She responded well to oral iron, her anemia has resolved -This is likely related to her liver cirrhosis.  Cancer screening -I recommend routine cancer screening, including annual mammogram, and colonoscopy per GI -She is also at risk for liver cancer due to her cirrhosis.  I recommend abdominal ultrasound of liver and AFP every 6 months -She will follow-up with her PCP and GI.  PLAN -I explained to her the lab test results, no further hematological blood  workup needed. -I will see her back as needed.  All questions were answered. The patient knows to call the clinic with any problems, questions or concerns. I spent 30 minutes counseling the patient face to face. The total time spent in the appointment was 30 minutes and more than 50% was on counseling.     Truitt Merle, MD 04/21/22 8:28 AM

## 2022-04-20 NOTE — Progress Notes (Signed)
Addendum: Reviewed and agree with assessment and management plan. Plan for hospital upper and lower endoscopy. Kashius Dominic, Lajuan Lines, MD

## 2022-04-21 ENCOUNTER — Encounter: Payer: Self-pay | Admitting: Hematology

## 2022-04-21 ENCOUNTER — Inpatient Hospital Stay: Payer: Medicare Other

## 2022-04-21 ENCOUNTER — Inpatient Hospital Stay: Payer: Medicare Other | Attending: Hematology | Admitting: Hematology

## 2022-04-21 VITALS — BP 124/68 | HR 77 | Temp 97.7°F | Resp 14 | Ht 69.0 in | Wt 298.9 lb

## 2022-04-21 DIAGNOSIS — D890 Polyclonal hypergammaglobulinemia: Secondary | ICD-10-CM | POA: Diagnosis not present

## 2022-04-21 DIAGNOSIS — D509 Iron deficiency anemia, unspecified: Secondary | ICD-10-CM | POA: Diagnosis not present

## 2022-04-21 DIAGNOSIS — K7460 Unspecified cirrhosis of liver: Secondary | ICD-10-CM

## 2022-04-21 DIAGNOSIS — D500 Iron deficiency anemia secondary to blood loss (chronic): Secondary | ICD-10-CM

## 2022-04-21 NOTE — Assessment & Plan Note (Signed)
-  Diagnosed around 2018.  With mild anemia -She responded well to oral iron, her anemia has resolved -This is likely related to her liver cirrhosis.

## 2022-04-21 NOTE — Assessment & Plan Note (Signed)
-  Detected blood test -Since M protein was negative, immunofixation showed polyclonal gammopathy, this is likely related to autoimmune process or chronic inflammation, no suspicion for MGUS or multiple myeloma.  She does not have other clinical suspicion for multiple myeloma. -No further hematological workup needed.

## 2022-04-23 ENCOUNTER — Ambulatory Visit (HOSPITAL_COMMUNITY): Payer: Medicare Other | Attending: Cardiovascular Disease

## 2022-04-23 ENCOUNTER — Telehealth: Payer: Self-pay

## 2022-04-23 DIAGNOSIS — I5022 Chronic systolic (congestive) heart failure: Secondary | ICD-10-CM | POA: Diagnosis not present

## 2022-04-23 LAB — ECHOCARDIOGRAM COMPLETE
Area-P 1/2: 2.02 cm2
P 1/2 time: 859 msec
S' Lateral: 3.65 cm

## 2022-04-23 NOTE — Telephone Encounter (Signed)
Faxed 1/20 office note to the patient PCP Dr. Velna Ochs as per Dr. Burr Medico

## 2022-04-24 ENCOUNTER — Telehealth: Payer: Self-pay

## 2022-04-24 NOTE — Telephone Encounter (Signed)
Called and LM for patient to call back re: scheduling her procedure with Dr. Hilarie Fredrickson at [facility] on March 12th.

## 2022-04-24 NOTE — Telephone Encounter (Signed)
-----  Message from Roetta Sessions, [place] sent at 04/23/2022  9:03 AM EST ----- Regarding: RE: [facility] like March 12th is full now.  Can you add her to his list? Thanks!  Jan   ----- Message ----- From: Larina Bras, CMA Sent: 04/20/2022  10:34 AM EST To: Roetta Sessions, CMA Subject: RE: Hospital ECL - [facility]                      He should have availability in March (3/12). I have a list, but all of those folks have been contacted multiple times and have not responded. So you are good to schedule. ----- Message ----- From: Roetta Sessions, CMA Sent: 04/18/2022  12:30 PM EST To: Algernon Huxley, RN; Larina Bras, CMA Subject: Hospital ECL - [facility]                          Please add this patient to your Hospital list for [facility] (due to EF). Dx:  Cirrhosis, IDA, hx of adenomatous polyps She saw [facility] today Just let me know when he has an opening and I will get her scheduled Thanks, Jan

## 2022-04-25 NOTE — Telephone Encounter (Signed)
Called and spoke to patient. Offered her March 12th with Dr. Hilarie Fredrickson.  She will be out of town the 12th through the 16th. We will keep her on Dr. Vena Rua waitlist

## 2022-04-26 ENCOUNTER — Telehealth: Payer: Self-pay | Admitting: *Deleted

## 2022-04-26 MED ORDER — NA SULFATE-K SULFATE-MG SULF 17.5-3.13-1.6 GM/177ML PO SOLN: ORAL | 0 refills | Status: DC

## 2022-04-26 NOTE — Addendum Note (Signed)
Addended by: Larina Bras on: 04/26/2022 03:21 PM   Modules accepted: Orders

## 2022-04-26 NOTE — Addendum Note (Signed)
Addended by: Larina Bras on: 04/26/2022 03:35 PM   Modules accepted: Orders

## 2022-04-26 NOTE — Telephone Encounter (Signed)
Patient has been scheduled for endoscopy/colonoscopy at [facility] on 07/05/22 at 845 am, 25 am arrival. Manus Gunning has been sent to pharmacy, ambulatory referral placed. Patient has been advised of this information and verbalizes understanding. She has also been advised to have someone 18 or older to bring her, stay and take her home from her procedure due to sedation. In addition, written prep instructions have been created and sent via mychart and USPS.

## 2022-04-27 NOTE — Telephone Encounter (Signed)
Patient's daughter called, stated she would not be able to bring her mother to her procedure at [facility]. Requesting to reschedule to a date after 4/4. Please advise.

## 2022-04-30 NOTE — Telephone Encounter (Signed)
I have left a message for patient to call back to discuss a different date for her endoscopy/colonoscopy procedure.

## 2022-05-04 NOTE — Telephone Encounter (Addendum)
Patient indicates that 07/31/22 would be a better date for her to have endoscopy/colonoscopy at the hospital. I have rescheduled her endoscopy/colonoscopy to 07/31/22 at 930 am, [facility] with 800 am arrival. I have updated instructions in [place] as well and have sent instructions via mail. Patient verbalizes understanding and indicates she will call with any additional prep questions.

## 2022-05-22 ENCOUNTER — Other Ambulatory Visit (HOSPITAL_COMMUNITY): Payer: Self-pay

## 2022-05-22 ENCOUNTER — Ambulatory Visit: Payer: Self-pay

## 2022-05-22 MED ORDER — DAPAGLIFLOZIN PROPANEDIOL 10 MG PO TABS: 10.0000 mg | ORAL_TABLET | Freq: Every day | ORAL | 11 refills | Status: DC

## 2022-05-22 NOTE — Patient Outreach (Signed)
  Care Coordination   Follow Up Visit Note   05/22/2022 Name: Tina Lara MRN: [id number] DOB: 12-Jun-1955  Tina Lara is a 67 y.o. year old female who sees Velna Ochs, MD for primary care. I spoke with  [name] by phone today.  What matters to the patients health and wellness today?  According to [name], she is doing well and has no chest pain, shortness of breath, or swelling. She feels that each day she is improving. She is trying to maintain her health. She had trouble getting her [place] prescription filled and thought the doctor's office needed to intervene as per the pharmacy's instructions. I called the pharmacy with [name], and we found out that her prescription was too early to be filled. She needs to wait until the 23rd when her insurance will cover it.      Goals Addressed             This Visit's Progress    I want to manage and monitor my CHF       Patient Goals/Self Care Activities: -Patient/Caregiver will self-administer medications as prescribed as evidenced by self-report/primary caregiver report  -Patient/Caregiver will attend all scheduled provider appointments as evidenced by clinician review of documented attendance to scheduled appointments and patient/caregiver report -Patient/Caregiver will call pharmacy for medication refills as evidenced by patient report and review of pharmacy fill history as appropriate -Patient/Caregiver will call provider office for new concerns or questions as evidenced by review of documented incoming telephone call notes and patient report -Patient/Caregiver verbalizes understanding of plan -Patient/Caregiver will focus on medication adherence by taking medications as prescribed  -Weigh daily and record (notify MD with 3 lb weight gain over night or 5 lb in a week) -Follow CHF Action Plan -Adhere to low sodium diet          SDOH assessments and interventions completed:  No     Care  Coordination Interventions:  Yes, provided   Interventions Today    Flowsheet Row Most Recent Value  Chronic Disease   Chronic disease during today's visit Congestive Heart Failure (CHF)  General Interventions   General Interventions Discussed/Reviewed General Interventions Reviewed  Education Interventions   Education Provided Provided Education  Provided Verbal Education On Medication  Pharmacy Interventions   Pharmacy Dicussed/Reviewed Pharmacy Topics Discussed, Pharmacy Topics Reviewed        Follow up plan: Follow up call scheduled for 06/21/22 11 am    Encounter Outcome:  Pt. Visit Completed    Lazaro Arms RN, BSN, [facility]   Phone: [phone]

## 2022-05-22 NOTE — Patient Instructions (Signed)
Visit Information  Thank you for taking time to visit with me today. Please don't hesitate to contact me if I can be of assistance to you.   Following are the goals we discussed today:   Goals Addressed             This Visit's Progress    I want to manage and monitor my CHF       Patient Goals/Self Care Activities: -Patient/Caregiver will self-administer medications as prescribed as evidenced by self-report/primary caregiver report  -Patient/Caregiver will attend all scheduled provider appointments as evidenced by clinician review of documented attendance to scheduled appointments and patient/caregiver report -Patient/Caregiver will call pharmacy for medication refills as evidenced by patient report and review of pharmacy fill history as appropriate -Patient/Caregiver will call provider office for new concerns or questions as evidenced by review of documented incoming telephone call notes and patient report -Patient/Caregiver verbalizes understanding of plan -Patient/Caregiver will focus on medication adherence by taking medications as prescribed  -Weigh daily and record (notify MD with 3 lb weight gain over night or 5 lb in a week) -Follow CHF Action Plan -Adhere to low sodium diet          Our next appointment is by telephone on 06/21/22 at 11 am  Please call the care guide team at [phone] if you need to cancel or reschedule your appointment.   If you are experiencing a Mental Health or [place] or need someone to talk to, please call 1-800-273-TALK (toll free, 24 hour hotline)  Patient verbalizes understanding of instructions and care plan provided today and agrees to view in [place]. Active MyChart status and patient understanding of how to access instructions and care plan via MyChart confirmed with patient.     Lazaro Arms RN, BSN, [place] [facility]   Phone: [phone]

## 2022-05-29 ENCOUNTER — Encounter: Payer: Self-pay | Admitting: Internal Medicine

## 2022-06-01 ENCOUNTER — Ambulatory Visit: Payer: Medicare Other | Attending: Internal Medicine | Admitting: Internal Medicine

## 2022-06-01 ENCOUNTER — Encounter: Payer: Self-pay | Admitting: Internal Medicine

## 2022-06-01 VITALS — BP 132/84 | HR 77 | Ht 69.0 in | Wt 293.6 lb

## 2022-06-01 DIAGNOSIS — I5022 Chronic systolic (congestive) heart failure: Secondary | ICD-10-CM

## 2022-06-01 NOTE — Progress Notes (Signed)
Cardiology Office Note:    Date:  06/01/2022   ID:  KAITLYND YOH, DOB 09/01/1955, MRN [id number]  PCP:  Velna Ochs, MD   [facility] Cardiologist:  Janina Mayo, MD     Referring MD: Velna Ochs, MD   No chief complaint on file. Systolic Heart Failure  History of Present Illness:    Albion is a 67 y.o. female with a hx of GERD, cirrhosis, HTN, DM2, obesity BMI 51, referral from Dr. Philipp Ovens who saw her in clinic 11/8 with symptoms of abdominal pain, dyspnea and LE swelling.   She was seen prior in the ED on 01/28/2022 with SOB for 1 weeks and orthopnea. Signs of CHF. She also had leg swelling. In the ED she also reported recent trip to [place]. She underwent CT PE. No PE. Liver c/f possible cirrhosis. She was given a dose of lasix in the ED and recommended to increase it for 2 days. They messaged her PCP regarding an upcoming visit. Arranged from FU with cardiology. No plan for admission or further w/u.  She presented to clinic on 11/8 for the above symptoms. She had an echo done showing systolic BI-V failure EF [id number], moderately dilated RV, grade III diastolic dysfunction. Small pericardial effusion. She had elevated Kappa/Lambda ratio 2.45. BNP 1423.   She had an echo prior 08/15/2016 that showed normal LV function, moderate LVH, grade I DD  Today, I spoke with her and her daughter. She lost about 15 pounds on torsemide and diuresed well. However she has elevated JVD and significant LE edema. She had significant dyspnea on minimal exertion  Wt Readings from Last 3 Encounters:  06/01/22 293 lb 9.6 oz (133.2 kg)  04/21/22 298 lb 14.4 oz (135.6 kg)  04/18/22 295 lb 9.6 oz (134.1 kg)    Interim Hx 03/21/2022 Mrs Trolinger was admitted 11/22 to 11/28 2023. She was IV diuresed. She had a RHC/LHC. She had high CO c/f high-output cardiac failure. No signs of HHT. She was started on GDMT.  TTE was felt not to be c/w amyloid after review. She  diuresed. In terms of her gammopathy , she had no M spike to suggest multiple myeloma. Dr. 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 I suggested heme consult to follow.    She saw her PCP Dr. Philipp Ovens recently and was doing well.   Today, her weight is down 12 pounds. Her breathing is improved. She is continuing on her GDMT.  Her daughter cooked healthy food for Thanksgiving. No orthopnea/PND. Leg swelling improved.  Interim Hx 06/01/2022 She is planned for a CMR. Noted plan for bubble study to assess for shunting. Can assess with CMR. She is going to [employer] with her daughter. She is going to [place]. She is going to [facility] to see [employer].   She denies angina, dyspnea on exertion, lower extremity edema, PND or orthopnea.  She's lost weight  Wt Readings from Last 3 Encounters:  06/01/22 293 lb 9.6 oz (133.2 kg)  04/21/22 298 lb 14.4 oz (135.6 kg)  04/18/22 295 lb 9.6 oz (134.1 kg)     Cardiology Studies: TTE11/10/2021- EF 25-30%,Grade III, RV function moderately reduced, mod-severe TR,RA pressure 3 LHC/RHC 02/23/2022:  CO 9.46 L/min, LVEDP 21 mmHg, mild pulmonary Htn (mean PA 34 mmHg), wedge 23 mmHg. LHC-mild LAD 40-50% TTE 04/23/2022- EF 45-50%, RV fxn nl,  no significant valve dx, RA pressure 3 mmHg   Past Medical History:  Diagnosis Date   Anxiety    Cataract    NS OD   Chest pain [id number]   Chronic systolic heart failure (HCC)    Complication of anesthesia    Per patient "needs a lot of anesthesia w/surgery" ,I had toruble with a spinal block not taking "   Diabetes mellitus without complication (HCC)    Type 2   Diabetic retinopathy (HCC)    BDR   Full dentures    GERD (gastroesophageal reflux disease)    Heart failure ([place])    History of surgery on arm    right arm broken   Hyperlipidemia    Hypertension    never diagnosed by  physician per pt   Macular degeneration    Obesity     Past Surgical History:  Procedure Laterality Date   BIOPSY  09/30/2017   Procedure: BIOPSY;  Surgeon: Jerene Bears, MD;  Location: [facility];  Service: Gastroenterology;;   CERVICAL CONIZATION W/BX N/A 09/10/2017   Procedure: CONIZATION CERVIX WITH BIOPSY - COLD KNIFE;  Surgeon: Emily Filbert, MD;  Location: [facility];  Service: Gynecology;  Laterality: N/A;   CESAREAN SECTION  1989   x 1   CHOLECYSTECTOMY     gall stones   COLONOSCOPY WITH PROPOFOL N/A 09/30/2017   Procedure: COLONOSCOPY WITH PROPOFOL;  Surgeon: Jerene Bears, MD;  Location: [facility];  Service: Gastroenterology;  Laterality: N/A;   DILATION AND CURETTAGE OF UTERUS N/A 09/10/2017   Procedure: DILATATION AND CURETTAGE;  Surgeon: Emily Filbert, MD;  Location: [facility];  Service: Gynecology;  Laterality: N/A;   MULTIPLE TOOTH EXTRACTIONS     dentures upper and lowers   POLYPECTOMY  09/30/2017   Procedure: POLYPECTOMY;  Surgeon: Jerene Bears, MD;  Location: [facility];  Service: Gastroenterology;;   RIGHT/LEFT HEART CATH AND CORONARY ANGIOGRAPHY N/A 02/23/2022   Procedure: RIGHT/LEFT HEART CATH AND CORONARY ANGIOGRAPHY;  Surgeon: Belva Crome, MD;  Location: [facility];  Service: Cardiovascular;  Laterality: N/A;   TONSILLECTOMY      Current Medications: Current Meds  Medication Sig   acetaminophen (TYLENOL) 500 MG tablet Take 1,500 mg by mouth every 6 (six) hours as needed for moderate pain or headache.    aspirin EC 81 MG tablet Take 1 tablet (81 mg total) by mouth daily.   carvedilol (COREG) 6.25 MG tablet Take 1 tablet (6.25 mg total) by mouth 2 (two) times daily with a meal.   dapagliflozin propanediol (FARXIGA) 10 MG TABS tablet Take 1 tablet (10 mg total) by mouth daily before breakfast.   dicyclomine (BENTYL) 20 MG tablet Take 1 tablet (20 mg total) by mouth 2 (two) times daily as needed for spasms (abdominal pain).   ferrous sulfate 325 (65 FE) MG  tablet Take 1 tablet (325 mg total) by mouth every other day.   lactulose (CHRONULAC) 10 GM/15ML solution TAKE 15 MLS BY MOUTH TWICE DAILY AS NEEDED FOR MILD CONSTIPATION   Menthol, Topical Analgesic, (BIOFREEZE EX) Apply 1 application  topically daily as needed (back pain).   Na Sulfate-K Sulfate-Mg Sulf 17.5-3.13-1.6 GM/177ML SOLN Use as directed; may use generic; goodrx card if insurance will not cover generic   sacubitril-valsartan (ENTRESTO) 24-26 MG Take 1 tablet by mouth 2 (two) times daily.   Semaglutide,0.25 or 0.'5MG'$ /DOS, 2 MG/3ML SOPN Inject 0.25 mg into the skin once a week.   spironolactone ([name]) 25  MG tablet Take 1 tablet (25 mg total) by mouth daily.   torsemide (DEMADEX) 20 MG tablet Take 2 tablets (40 mg total) by mouth daily.     Allergies:   Percocet [oxycodone-acetaminophen]   Social History   Socioeconomic History   Marital status: Divorced    Spouse name: Not on file   Number of children: 1   Years of education: Not on file   Highest education level: High school graduate  Occupational History   Occupation: disability  Tobacco Use   Smoking status: Former    Packs/day: 0.25    Years: 6.00    Total pack years: 1.50    Types: Cigarettes   Smokeless tobacco: Never   Tobacco comments:    quit 36 yrs ago  Vaping Use   Vaping Use: Never used  Substance and Sexual Activity   Alcohol use: Yes    Comment: occasionally   Drug use: No   Sexual activity: Not Currently    Birth control/protection: None  Other Topics Concern   Not on file  Social History Narrative   Not on file   Social Determinants of Health   Financial Resource Strain: High Risk (02/26/2022)   Overall Financial Resource Strain (CARDIA)    Difficulty of Paying Living Expenses: Very hard  Food Insecurity: No Food Insecurity (02/21/2022)   Hunger Vital Sign    Worried About Running Out of Food in the Last Year: Never true    [place] in the Last Year: Never true  Recent Concern:  [place] Present (02/14/2022)   Hunger Vital Sign    Worried About [place] in the Last Year: Sometimes true    Ran Out of Food in the Last Year: Sometimes true  Transportation Needs: No Transportation Needs (03/01/2022)   PRAPARE - Hydrologist (Medical): No    Lack of Transportation (Non-Medical): No  Physical Activity: Inactive (02/14/2022)   Exercise Vital Sign    Days of Exercise per Week: 0 days    Minutes of Exercise per Session: 0 min  Stress: No Stress Concern Present (02/14/2022)   [place]    Feeling of Stress : Not at all  Social Connections: Moderately Isolated (02/14/2022)   Social Connection and Isolation Panel [NHANES]    Frequency of Communication with Friends and Family: Twice a week    Frequency of Social Gatherings with Friends and Family: Once a week    Attends Religious Services: More than 4 times per year    Active Member of [employer] or Organizations: No    Attends Music therapist: Never    Marital Status: Divorced     Family History: The patient's family history includes Diabetes in her mother; Heart disease in her father and mother; Kidney disease in her mother; Other in her father; Seizures in her father. There is no history of Breast cancer.  ROS:   Please see the history of present illness.     All other systems reviewed and are negative.  EKGs/Labs/Other Studies Reviewed:    The following studies were reviewed today:   EKG:    Recent Labs: 02/20/2022: B Natriuretic Peptide 1,423.2 02/24/2022: TSH 5.470 02/27/2022: Magnesium 2.0 04/18/2022: ALT 26; BUN 43; Creatinine, Ser 1.87; Hemoglobin 15.2; Platelets 174.0; Potassium 4.0; Sodium 135   Recent Lipid Panel    Component Value Date/Time   CHOL 118 09/06/2021 1014  TRIG 57 09/06/2021 1014   HDL 33 (L) 09/06/2021 1014   CHOLHDL 3.6 09/06/2021 1014    LDLCALC 72 09/06/2021 1014     Risk Assessment/Calculations:     Physical Exam:    VS:  Vitals:   06/01/22 0844  BP: (!) 132/8  Pulse: 77    Wt Readings from Last 3 Encounters:  06/01/22 293 lb 9.6 oz (133.2 kg)  04/21/22 298 lb 14.4 oz (135.6 kg)  04/18/22 295 lb 9.6 oz (134.1 kg)     GEN: obese, Well nourished, well developed in no acute distress HEENT: Normal NECK: no sig JVD LYMPHATICS: No lymphadenopathy CARDIAC: RRR, no murmurs, rubs, gallops RESPIRATORY:  Clear to auscultation without rales, wheezing or rhonchi  ABDOMEN: Soft, non-tender, non-distended MUSCULOSKELETAL:  no pitting edema SKIN: Warm and dry NEUROLOGIC:  Alert and oriented x 3 PSYCHIATRIC:  Normal affect   ASSESSMENT:    NICM, 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  No significant coronary dx. Her cath also showed an elevated wedge. Low suspicion for cardiac amyloid. No MGUS or multiple myeloma  Group II PHTN.  Etiology is unknown, can be related to HTN. If EF does not recover with GDMT, may consider CMR. Would also discuss primary prevention DC-ICD.  - euvolemic, she has lost weight, new dry wt. 293 - pending CMR in April - continue coreg 6.25 mg BID - continue entresto 24-26 mg BID - continue farxiga 10 mg daily - continue spironolactone 25 mg daily - continue torsemide 40 mg daily  Obesity: She is losing weight. She has a great support system. ozempic  HLD: atorvastatin 40 mg daily   PLAN:    In order of problems listed above:  - Follow up in 6 months        Medication Adjustments/Labs and Tests Ordered: Current medicines are reviewed at length with the patient today.  Concerns regarding medicines are outlined above.  No orders of the defined types were placed in this encounter.  No orders of the defined types were placed in this encounter.   There are no Patient Instructions on file for this visit.   Signed, Janina Mayo, MD  06/01/2022 8:58 AM    [place]

## 2022-06-01 NOTE — Patient Instructions (Signed)
Medication Instructions:  Your physician recommends that you continue on your current medications as directed. Please refer to the Current Medication list given to you today.  *If you need a refill on your cardiac medications before your next appointment, please call your pharmacy*   Follow-Up: At [facility], you and your health needs are our priority.  As part of our continuing mission to provide you with exceptional heart care, we have created designated Provider Care Teams.  These Care Teams include your primary Cardiologist (physician) and Advanced Practice Providers (APPs -  Physician Assistants and Nurse Practitioners) who all work together to provide you with the care you need, when you need it.  We recommend signing up for the patient portal called "MyChart".  Sign up information is provided on this After Visit Summary.  MyChart is used to connect with patients for Virtual Visits (Telemedicine).  Patients are able to view lab/test results, encounter notes, upcoming appointments, etc.  Non-urgent messages can be sent to your provider as well.   To learn more about what you can do with MyChart, go to [email or web address].    Your next appointment:   6 month(s)  Provider:   Janina Mayo, MD

## 2022-06-04 ENCOUNTER — Encounter: Payer: Self-pay | Admitting: Internal Medicine

## 2022-06-21 ENCOUNTER — Other Ambulatory Visit: Payer: Self-pay | Admitting: Internal Medicine

## 2022-06-21 ENCOUNTER — Ambulatory Visit: Payer: Self-pay

## 2022-06-21 DIAGNOSIS — I5043 Acute on chronic combined systolic (congestive) and diastolic (congestive) heart failure: Secondary | ICD-10-CM

## 2022-06-21 NOTE — Patient Outreach (Signed)
  Care Coordination   Follow Up Visit Note   06/22/2022 Name: AILED Lara MRN: [id number] DOB: 1955/11/18  Tina Lara is a 67 y.o. year old female who sees Tina Ochs, MD for primary care. I spoke with  [name] by phone today.  What matters to the patients health and wellness today?  Tina Lara reports that her blood sugar levels are stable. She recently saw her cardiologist and received good news. She can follow up in six months. Her weight today is 291 lbs, which she checks daily. Her blood pressure reading was 132/84. However, she hasn't checked her blood sugar levels yet, as her daughter helps her with that. Tina Lara daughter is helping her maintain a healthy lifestyle by monitoring her food and fluid intake. She has been making positive changes, which are helping her stay on track. I encouraged her to continue with the excellent work that she is doing.    Goals Addressed             This Visit's Progress    I want to manage and monitor my CHF       Patient Goals/Self Care Activities: -Patient/Caregiver will self-administer medications as prescribed as evidenced by self-report/primary caregiver report  -Patient/Caregiver will attend all scheduled provider appointments as evidenced by clinician review of documented attendance to scheduled appointments and patient/caregiver report -Patient/Caregiver will call pharmacy for medication refills as evidenced by patient report and review of pharmacy fill history as appropriate -Patient/Caregiver will call provider office for new concerns or questions as evidenced by review of documented incoming telephone call notes and patient report -Patient/Caregiver verbalizes understanding of plan -Patient/Caregiver will focus on medication adherence by taking medications as prescribed  -Weigh daily and record (notify MD with 3 lb weight gain over night or 5 lb in a week) -Follow CHF Action Plan -Continue to monitor you  food and fluid intake         SDOH assessments and interventions completed:  No     Care Coordination Interventions:  Yes, provided  Interventions Today    Flowsheet Row Most Recent Value  Chronic Disease   Chronic disease during today's visit Congestive Heart Failure (CHF)  General Interventions   General Interventions Discussed/Reviewed General Interventions Reviewed        Follow up plan: Follow up call scheduled for 07/24/22  11 am    Encounter Outcome:  Pt. Visit Completed   Lazaro Arms RN, BSN, [facility]   Phone: [phone]

## 2022-06-22 NOTE — Patient Instructions (Signed)
Visit Information  Thank you for taking time to visit with me today. Please don't hesitate to contact me if I can be of assistance to you.   Following are the goals we discussed today:   Goals Addressed             This Visit's Progress    I want to manage and monitor my CHF       Patient Goals/Self Care Activities: -Patient/Caregiver will self-administer medications as prescribed as evidenced by self-report/primary caregiver report  -Patient/Caregiver will attend all scheduled provider appointments as evidenced by clinician review of documented attendance to scheduled appointments and patient/caregiver report -Patient/Caregiver will call pharmacy for medication refills as evidenced by patient report and review of pharmacy fill history as appropriate -Patient/Caregiver will call provider office for new concerns or questions as evidenced by review of documented incoming telephone call notes and patient report -Patient/Caregiver verbalizes understanding of plan -Patient/Caregiver will focus on medication adherence by taking medications as prescribed  -Weigh daily and record (notify MD with 3 lb weight gain over night or 5 lb in a week) -Follow CHF Action Plan -Continue to monitor you food and fluid intake         Our next appointment is by telephone on 07/24/22 at 11 am  Please call the care guide team at [phone] if you need to cancel or reschedule your appointment.   If you are experiencing a Mental Health or [place] or need someone to talk to, please call 1-800-273-TALK (toll free, 24 hour hotline)  Patient verbalizes understanding of instructions and care plan provided today and agrees to view in [place]. Active MyChart status and patient understanding of how to access instructions and care plan via MyChart confirmed with patient.     Lazaro Arms RN, BSN, [facility]   Phone: [phone]

## 2022-07-02 ENCOUNTER — Other Ambulatory Visit: Payer: Self-pay | Admitting: Internal Medicine

## 2022-07-04 ENCOUNTER — Other Ambulatory Visit: Payer: Self-pay

## 2022-07-04 MED ORDER — SACUBITRIL-VALSARTAN 24-26 MG PO TABS: 1.0000 | ORAL_TABLET | Freq: Two times a day (BID) | ORAL | 3 refills | Status: DC

## 2022-07-11 ENCOUNTER — Telehealth (HOSPITAL_COMMUNITY): Payer: Self-pay | Admitting: Emergency Medicine

## 2022-07-11 NOTE — Telephone Encounter (Signed)
Attempted to call patient regarding upcoming cardiac CT appointment. °Left message on voicemail with name and callback number °Janaiya Beauchesne RN Navigator Cardiac Imaging °[facility] °[phone] Office °[phone] Cell ° °

## 2022-07-12 ENCOUNTER — Telehealth (HOSPITAL_COMMUNITY): Payer: Self-pay | Admitting: *Deleted

## 2022-07-12 NOTE — Telephone Encounter (Signed)
Patient returning call about her upcoming cardiac imaging study; pt verbalizes understanding of appt date/time, parking situation and where to check in, and verified current allergies; name and call back number provided for further questions should they arise  Larey Brick RN Navigator Cardiac Imaging [facility] [phone] office [phone] cell  Patient denies metal or claustrophobia.

## 2022-07-13 ENCOUNTER — Other Ambulatory Visit (HOSPITAL_COMMUNITY): Payer: Self-pay | Admitting: Cardiology

## 2022-07-13 ENCOUNTER — Ambulatory Visit (HOSPITAL_COMMUNITY)
Admission: RE | Admit: 2022-07-13 | Discharge: 2022-07-13 | Disposition: A | Discharge status: Home / Self Care | Payer: Medicare Other | Source: Ambulatory Visit | Attending: Cardiology | Admitting: Cardiology

## 2022-07-13 DIAGNOSIS — I5022 Chronic systolic (congestive) heart failure: Secondary | ICD-10-CM | POA: Diagnosis not present

## 2022-07-13 MED ORDER — GADOBUTROL 1 MMOL/ML IV SOLN
16.0000 mL | Freq: Once | INTRAVENOUS | Status: AC | PRN
  Administered 2022-07-13: 16 mL via INTRAVENOUS

## 2022-07-24 ENCOUNTER — Encounter: Payer: Self-pay | Admitting: Internal Medicine

## 2022-07-24 ENCOUNTER — Ambulatory Visit: Payer: Self-pay

## 2022-07-24 ENCOUNTER — Encounter (HOSPITAL_COMMUNITY): Payer: Self-pay | Admitting: Internal Medicine

## 2022-07-24 NOTE — Patient Outreach (Signed)
  Care Coordination   Follow Up Visit Note   07/24/2022 Name: Tina Lara MRN: [id number] DOB: 1955-09-21  Tina Lara is a 67 y.o. year old female who sees Reymundo Poll, MD for primary care. I spoke with  [place] by phone today.  What matters to the patients health and wellness today?  Tina Lara reported that she is doing well, but she has been struggling to afford her medications every month due to the high cost. The medications she mentioned, Tina Lara, Farxiga, and Ozempic, cost her between $100 to $200 every month. I assured her that I would contact Tina Lara, a [employer], to see if she could offer any assistance. Tina Lara also informed me that her blood sugar levels range between 110 and 200 and that she is eating and sleeping well.    Goals Addressed             This Visit's Progress    I want to manage and monitor my CHF       Patient Goals/Self Care Activities: -Patient/Caregiver will self-administer medications as prescribed as evidenced by self-report/primary caregiver report  -Patient/Caregiver will attend all scheduled provider appointments as evidenced by clinician review of documented attendance to scheduled appointments and patient/caregiver report -Patient/Caregiver will call pharmacy for medication refills as evidenced by patient report and review of pharmacy fill history as appropriate -Patient/Caregiver will call provider office for new concerns or questions as evidenced by review of documented incoming telephone call notes and patient report -Patient/Caregiver verbalizes understanding of plan -Patient/Caregiver will focus on medication adherence by taking medications as prescribed  -Weigh daily and record (notify MD with 3 lb weight gain over night or 5 lb in a week) -Follow CHF Action Plan -Continue to monitor you food and fluid intake Patient Goals/Self Care Activities: -check blood sugar at prescribed times -record values and  write them down take them to all doctor visits            SDOH assessments and interventions completed:  No     Care Coordination Interventions:  Yes, provided   Interventions Today    Flowsheet Row Most Recent Value  Chronic Disease   Chronic disease during today's visit Diabetes, Congestive Heart Failure (CHF)  General Interventions   General Interventions Discussed/Reviewed General Interventions Discussed  Nutrition Interventions   Nutrition Discussed/Reviewed Nutrition Discussed  Pharmacy Interventions   Pharmacy Dicussed/Reviewed Pharmacy Topics Discussed  [Medication cost]       Follow up plan: Follow up call scheduled for 08/22/22 10 am    Encounter Outcome:  Pt. Visit Completed   Tina Fairly RN, BSN, [facility] Care Coordinator [facility]   Phone: [phone]

## 2022-07-24 NOTE — Patient Instructions (Signed)
Visit Information  Thank you for taking time to visit with me today. Please don't hesitate to contact me if I can be of assistance to you.   Following are the goals we discussed today:   Goals Addressed             This Visit's Progress    I want to manage and monitor my CHF       Patient Goals/Self Care Activities: -Patient/Caregiver will self-administer medications as prescribed as evidenced by self-report/primary caregiver report  -Patient/Caregiver will attend all scheduled provider appointments as evidenced by clinician review of documented attendance to scheduled appointments and patient/caregiver report -Patient/Caregiver will call pharmacy for medication refills as evidenced by patient report and review of pharmacy fill history as appropriate -Patient/Caregiver will call provider office for new concerns or questions as evidenced by review of documented incoming telephone call notes and patient report -Patient/Caregiver verbalizes understanding of plan -Patient/Caregiver will focus on medication adherence by taking medications as prescribed  -Weigh daily and record (notify MD with 3 lb weight gain over night or 5 lb in a week) -Follow CHF Action Plan -Continue to monitor you food and fluid intake Patient Goals/Self Care Activities: -check blood sugar at prescribed times -record values and write them down take them to all doctor visits            Our next appointment is by telephone on 08/22/22 at 10  am  Please call the care guide team at [phone] if you need to cancel or reschedule your appointment.   If you are experiencing a Mental Health or Behavioral Health Crisis or need someone to talk to, please call 1-800-273-TALK (toll free, 24 hour hotline)  Patient verbalizes understanding of instructions and care plan provided today and agrees to view in MyChart. Active MyChart status and patient understanding of how to access instructions and care plan via MyChart  confirmed with patient.     Juanell Fairly RN, BSN, [facility] Care Coordinator [facility]   Phone: [phone]

## 2022-07-24 NOTE — Progress Notes (Signed)
Attempted to obtain medical history via telephone, unable to reach at this time. HIPAA compliant voicemail message left requesting return call to pre surgical testing department. 

## 2022-07-27 ENCOUNTER — Telehealth: Payer: Self-pay

## 2022-07-27 NOTE — Telephone Encounter (Signed)
-----   Message from Juanell Fairly, RN sent at 07/24/2022 11:36 AM EDT ----- Regarding: Cost of medications Hi Durward Mallard,  This patient has informed  me that she faces difficulty in purchasing her monthly medications. The medications are Entresto 24-26mg  costing $47, Farxig10mg  costing $47, and Ozempic costing [phone]. She is seeking information on any programs that can assist her in this regard. Could you please provide her with the necessary details?  Thank you.  Traci

## 2022-07-27 NOTE — Telephone Encounter (Signed)
Left message requesting call back regarding medication cost options.   Will suggest patient apply for LIS first. Will use PAP as secondary option if LIS is unsuccessful.

## 2022-07-27 NOTE — Telephone Encounter (Signed)
Pt returned call.   Explained LIS application option to patient. Pt also says she applied for medicaid about a month ago but hasn't heard back yet.   Pt would like me to send her info thru Mychart & we will move forward from there.

## 2022-07-31 ENCOUNTER — Ambulatory Visit (HOSPITAL_BASED_OUTPATIENT_CLINIC_OR_DEPARTMENT_OTHER): Payer: Medicare Other | Admitting: Anesthesiology

## 2022-07-31 ENCOUNTER — Ambulatory Visit (HOSPITAL_COMMUNITY): Payer: Medicare Other | Admitting: Anesthesiology

## 2022-07-31 ENCOUNTER — Encounter (HOSPITAL_COMMUNITY)
Admission: RE | Disposition: A | Discharge status: Home / Self Care | Payer: Self-pay | Source: Home / Self Care | Attending: Internal Medicine

## 2022-07-31 ENCOUNTER — Encounter (HOSPITAL_COMMUNITY): Payer: Self-pay | Admitting: Internal Medicine

## 2022-07-31 ENCOUNTER — Ambulatory Visit (HOSPITAL_COMMUNITY)
Admission: RE | Admit: 2022-07-31 | Discharge: 2022-07-31 | Disposition: A | Discharge status: Home / Self Care | Payer: Medicare Other | Attending: Internal Medicine | Admitting: Internal Medicine

## 2022-07-31 ENCOUNTER — Other Ambulatory Visit: Payer: Self-pay

## 2022-07-31 DIAGNOSIS — K254 Chronic or unspecified gastric ulcer with hemorrhage: Secondary | ICD-10-CM

## 2022-07-31 DIAGNOSIS — I501 Left ventricular failure: Secondary | ICD-10-CM | POA: Diagnosis not present

## 2022-07-31 DIAGNOSIS — Z86010 Personal history of colonic polyps: Secondary | ICD-10-CM

## 2022-07-31 DIAGNOSIS — B9681 Helicobacter pylori [H. pylori] as the cause of diseases classified elsewhere: Secondary | ICD-10-CM | POA: Insufficient documentation

## 2022-07-31 DIAGNOSIS — D509 Iron deficiency anemia, unspecified: Secondary | ICD-10-CM | POA: Diagnosis not present

## 2022-07-31 DIAGNOSIS — F419 Anxiety disorder, unspecified: Secondary | ICD-10-CM | POA: Diagnosis not present

## 2022-07-31 DIAGNOSIS — Z833 Family history of diabetes mellitus: Secondary | ICD-10-CM | POA: Insufficient documentation

## 2022-07-31 DIAGNOSIS — K317 Polyp of stomach and duodenum: Secondary | ICD-10-CM

## 2022-07-31 DIAGNOSIS — Z6841 Body Mass Index (BMI) 40.0 and over, adult: Secondary | ICD-10-CM | POA: Diagnosis not present

## 2022-07-31 DIAGNOSIS — I517 Cardiomegaly: Secondary | ICD-10-CM | POA: Insufficient documentation

## 2022-07-31 DIAGNOSIS — I2510 Atherosclerotic heart disease of native coronary artery without angina pectoris: Secondary | ICD-10-CM

## 2022-07-31 DIAGNOSIS — K219 Gastro-esophageal reflux disease without esophagitis: Secondary | ICD-10-CM | POA: Insufficient documentation

## 2022-07-31 DIAGNOSIS — I5022 Chronic systolic (congestive) heart failure: Secondary | ICD-10-CM

## 2022-07-31 DIAGNOSIS — K5730 Diverticulosis of large intestine without perforation or abscess without bleeding: Secondary | ICD-10-CM

## 2022-07-31 DIAGNOSIS — I272 Pulmonary hypertension, unspecified: Secondary | ICD-10-CM | POA: Insufficient documentation

## 2022-07-31 DIAGNOSIS — I110 Hypertensive heart disease with heart failure: Secondary | ICD-10-CM | POA: Diagnosis not present

## 2022-07-31 DIAGNOSIS — Z87891 Personal history of nicotine dependence: Secondary | ICD-10-CM | POA: Diagnosis not present

## 2022-07-31 DIAGNOSIS — K2950 Unspecified chronic gastritis without bleeding: Secondary | ICD-10-CM | POA: Diagnosis not present

## 2022-07-31 DIAGNOSIS — K922 Gastrointestinal hemorrhage, unspecified: Secondary | ICD-10-CM | POA: Diagnosis not present

## 2022-07-31 DIAGNOSIS — E785 Hyperlipidemia, unspecified: Secondary | ICD-10-CM | POA: Diagnosis not present

## 2022-07-31 DIAGNOSIS — Z7984 Long term (current) use of oral hypoglycemic drugs: Secondary | ICD-10-CM | POA: Insufficient documentation

## 2022-07-31 DIAGNOSIS — R933 Abnormal findings on diagnostic imaging of other parts of digestive tract: Secondary | ICD-10-CM | POA: Diagnosis not present

## 2022-07-31 DIAGNOSIS — E119 Type 2 diabetes mellitus without complications: Secondary | ICD-10-CM | POA: Diagnosis not present

## 2022-07-31 DIAGNOSIS — K250 Acute gastric ulcer with hemorrhage: Secondary | ICD-10-CM

## 2022-07-31 DIAGNOSIS — K7460 Unspecified cirrhosis of liver: Secondary | ICD-10-CM

## 2022-07-31 DIAGNOSIS — I509 Heart failure, unspecified: Secondary | ICD-10-CM | POA: Diagnosis not present

## 2022-07-31 DIAGNOSIS — Z1159 Encounter for screening for other viral diseases: Secondary | ICD-10-CM

## 2022-07-31 HISTORY — PX: BIOPSY: SHX5522

## 2022-07-31 HISTORY — PX: COLONOSCOPY WITH PROPOFOL: SHX5780

## 2022-07-31 HISTORY — PX: HEMOSTASIS CLIP PLACEMENT: SHX6857

## 2022-07-31 HISTORY — PX: ESOPHAGOGASTRODUODENOSCOPY (EGD) WITH PROPOFOL: SHX5813

## 2022-07-31 LAB — GLUCOSE, CAPILLARY: Glucose-Capillary: 114 mg/dL — ABNORMAL HIGH (ref 70–99)

## 2022-07-31 SURGERY — ESOPHAGOGASTRODUODENOSCOPY (EGD) WITH PROPOFOL
Anesthesia: Monitor Anesthesia Care

## 2022-07-31 MED ORDER — LIDOCAINE HCL (CARDIAC) PF 100 MG/5ML IV SOSY
PREFILLED_SYRINGE | INTRAVENOUS | Status: DC | PRN
  Administered 2022-07-31: 100 mg via INTRAVENOUS

## 2022-07-31 MED ORDER — PROPOFOL 500 MG/50ML IV EMUL
INTRAVENOUS | Status: DC | PRN
  Administered 2022-07-31: 125 ug/kg/min via INTRAVENOUS

## 2022-07-31 MED ORDER — SODIUM CHLORIDE 0.9 % IV SOLN: INTRAVENOUS | Status: DC

## 2022-07-31 MED ORDER — LACTATED RINGERS IV SOLN: INTRAVENOUS | Status: DC

## 2022-07-31 MED ORDER — PROPOFOL 10 MG/ML IV BOLUS
INTRAVENOUS | Status: DC | PRN
  Administered 2022-07-31: 50 mg via INTRAVENOUS
  Administered 2022-07-31: 40 mg via INTRAVENOUS

## 2022-07-31 MED ORDER — PANTOPRAZOLE SODIUM 40 MG PO TBEC: 40.0000 mg | DELAYED_RELEASE_TABLET | Freq: Two times a day (BID) | ORAL | 1 refills | Status: DC

## 2022-07-31 MED ORDER — PHENYLEPHRINE 80 MCG/ML (10ML) SYRINGE FOR IV PUSH (FOR BLOOD PRESSURE SUPPORT)
PREFILLED_SYRINGE | INTRAVENOUS | Status: DC | PRN
  Administered 2022-07-31 (×2): 160 ug via INTRAVENOUS

## 2022-07-31 SURGICAL SUPPLY — 25 items

## 2022-07-31 NOTE — Discharge Instructions (Signed)
YOU HAD AN ENDOSCOPIC PROCEDURE TODAY: Refer to the procedure report and other information in the discharge instructions given to you for any specific questions about what was found during the examination. If this information does not answer your questions, please call [place] office at [phone] to clarify.  ° °YOU SHOULD EXPECT: Some feelings of bloating in the abdomen. Passage of more gas than usual. Walking can help get rid of the air that was put into your GI tract during the procedure and reduce the bloating. If you had a lower endoscopy (such as a colonoscopy or flexible sigmoidoscopy) you may notice spotting of blood in your stool or on the toilet paper. Some abdominal soreness may be present for a day or two, also. ° °DIET: Your first meal following the procedure should be a light meal and then it is ok to progress to your normal diet. A half-sandwich or bowl of soup is an example of a good first meal. Heavy or fried foods are harder to digest and may make you feel nauseous or bloated. Drink plenty of fluids but you should avoid alcoholic beverages for 24 hours.  ° °ACTIVITY: Your care partner should take you home directly after the procedure. You should plan to take it easy, moving slowly for the rest of the day. You can resume normal activity the day after the procedure however YOU SHOULD NOT DRIVE, use power tools, machinery or perform tasks that involve climbing or major physical exertion for 24 hours (because of the sedation medicines used during the test).  ° °SYMPTOMS TO REPORT IMMEDIATELY: °A gastroenterologist can be reached at any hour. Please call [phone]  for any of the following symptoms:  °Following lower endoscopy (colonoscopy, flexible sigmoidoscopy) °Excessive amounts of blood in the stool  °Significant tenderness, worsening of abdominal pains  °Swelling of the abdomen that is new, acute  °Fever of 100° or higher  °Following upper endoscopy (EGD, EUS, ERCP, esophageal  dilation) °Vomiting of blood or coffee ground material  °New, significant abdominal pain  °New, significant chest pain or pain under the shoulder blades  °Painful or persistently difficult swallowing  °New shortness of breath  °Black, tarry-looking or red, bloody stools ° °FOLLOW UP:  °If any biopsies were taken you will be contacted by phone or by letter within the next 1-3 weeks. Call [phone]  if you have not heard about the biopsies in 3 weeks.  °Please also call with any specific questions about appointments or follow up tests.  °

## 2022-07-31 NOTE — Transfer of Care (Signed)
Immediate Anesthesia Transfer of Care Note  Patient: Tina Lara  Procedure(s) Performed: ESOPHAGOGASTRODUODENOSCOPY (EGD) WITH PROPOFOL COLONOSCOPY WITH PROPOFOL HEMOSTASIS CLIP PLACEMENT BIOPSY  Patient Location: PACU and Endoscopy Unit  Anesthesia Type:MAC  Level of Consciousness: awake and drowsy  Airway & Oxygen Therapy: Patient Spontanous Breathing  Post-op Assessment: Report given to RN and Post -op Vital signs reviewed and stable  Post vital signs: Reviewed and stable  Last Vitals:  Vitals Value Taken Time  BP    Temp    Pulse 87 07/31/22 1003  Resp 9 07/31/22 1003  SpO2 99 % 07/31/22 1003  Vitals shown include unvalidated device data.  Last Pain:  Vitals:   07/31/22 0848  TempSrc: Temporal  PainSc: 0-No pain         Complications: No notable events documented.

## 2022-07-31 NOTE — Anesthesia Preprocedure Evaluation (Addendum)
Anesthesia Evaluation  Patient identified by MRN, date of birth, ID band Patient awake    Reviewed: Allergy & Precautions, NPO status , Patient's Chart, lab work & pertinent test results  Airway Mallampati: II  TM Distance: >3 FB Neck ROM: Full    Dental  (+) Lower Dentures, Upper Dentures, Dental Advisory Given Pt requested to leave dentures in:   Pulmonary former smoker   Pulmonary exam normal breath sounds clear to auscultation       Cardiovascular hypertension, Pt. on medications pulmonary hypertension+ CAD and +CHF   Rhythm:Regular Rate:Normal  Echo 04/2022  1. Left ventricular ejection fraction, by estimation, is 45 to 50%. The left ventricle has mildly decreased function. The left ventricle demonstrates global hypokinesis. There is mild asymmetric left ventricular hypertrophy of the basal-septal segment. Left ventricular diastolic parameters are consistent with Grade I diastolic dysfunction (impaired relaxation).   2. Right ventricular systolic function is normal. The right ventricular size is normal. Tricuspid regurgitation signal is inadequate for assessing PA pressure.   3. The mitral valve is grossly normal. No evidence of mitral valve regurgitation. No evidence of mitral stenosis.   4. The aortic valve is tricuspid. Aortic valve regurgitation is trivial. No aortic stenosis is present.   5. The inferior vena cava is normal in size with greater than 50% respiratory variability, suggesting right atrial pressure of 3 mmHg.   Comparison(s): The left ventricular function has improved.    Echo 02/27/2022  1. Left ventricular ejection fraction, by estimation, is 30 to 35%. The left ventricle has moderately decreased function. Left ventricular diastolic parameters are consistent with Grade II diastolic dysfunction (pseudonormalization). Elevated left atrial pressure.   2. Right ventricular systolic function is mildly reduced. The  right ventricular size is moderately enlarged. Tricuspid regurgitation signal is inadequate for assessing PA pressure.   3. The aortic valve is tricuspid. Aortic valve regurgitation is not visualized. No aortic stenosis is present.   4. The inferior vena cava is normal in size with <50% respiratory variability, suggesting right atrial pressure of 8 mmHg.    Cath 02/23/2022  Left heart failure with high cardiac output 9.46 L/min with LVEDP 21 mmHg.  Mild pulmonary hypertension with mean PA pressure 34 mmHg.  Pulmonary capillary wedge pressure mean 23 mmHg; pulmonary vascular resistance 1.16 Woods units.  WHO group 2 pulmonary hypertension etiology.  Left dominant coronary anatomy.  No obstructive disease.  Mid LAD 40 to 50%.   RECOMMENDATIONS:  Exclude causes for high cardiac output such as systemic AV shunts or intrapulmonary shunting.  Guideline directed therapy for systolic heart failure.   Echo 02/07/2022  1. Left ventricular ejection fraction, by estimation, is 25 to 30%. The left ventricle has severely decreased function. The left ventricle demonstrates global hypokinesis. There is mild asymmetric left ventricular hypertrophy of the inferior segment. Left ventricular diastolic parameters are consistent with Grade III diastolic dysfunction (restrictive). Elevated left ventricular end-diastolic pressure.   2. Right ventricular systolic function is moderately reduced. The right ventricular size is mildly enlarged. There is mildly elevated pulmonary artery systolic pressure.   3. The mitral valve is normal in structure. No evidence of mitral valve regurgitation. No evidence of mitral stenosis.   4. Tricuspid valve regurgitation is moderate to severe.   5. The aortic valve is normal in structure. Aortic valve regurgitation is mild. Aortic valve sclerosis/calcification is present, without any evidence of aortic stenosis.   6. There is mild dilatation of the ascending aorta, measuring 37 mm.  7. The inferior vena cava is normal in size with greater than 50% respiratory variability, suggesting right atrial pressure of 3 mmHg.       Neuro/Psych   Anxiety     negative neurological ROS     GI/Hepatic Neg liver ROS, PUD,GERD  Medicated and Controlled,,  Endo/Other  diabetes, Oral Hypoglycemic Agents  Morbid obesitySuper obese  Renal/GU Renal disease     Musculoskeletal hcg negative   Abdominal  (+) + obese  Peds  Hematology  (+) Blood dyscrasia, anemia HLD   Anesthesia Other Findings   Reproductive/Obstetrics                             Anesthesia Physical Anesthesia Plan  ASA: 4  Anesthesia Plan: MAC   Post-op Pain Management: Minimal or no pain anticipated   Induction: Intravenous  PONV Risk Score and Plan: 2 and Treatment may vary due to age or medical condition, Propofol infusion and TIVA  Airway Management Planned: Simple Face Mask  Additional Equipment:   Intra-op Plan:   Post-operative Plan:   Informed Consent: I have reviewed the patients History and Physical, chart, labs and discussed the procedure including the risks, benefits and alternatives for the proposed anesthesia with the patient or authorized representative who has indicated his/her understanding and acceptance.     Dental advisory given  Plan Discussed with: CRNA  Anesthesia Plan Comments:         Anesthesia Quick Evaluation

## 2022-07-31 NOTE — H&P (Signed)
GASTROENTEROLOGY PROCEDURE H&P NOTE   Primary Care Physician: Reymundo Poll, MD    Reason for Procedure:  Iron deficiency anemia, history of polyps and possible cirrhosis by imaging  Plan:    EGD and colonoscopy  Patient is appropriate for endoscopic procedure(s) in the outpatient hospital setting.  The nature of the procedure, as well as the risks, benefits, and alternatives were carefully and thoroughly reviewed with the patient. Ample time for discussion and questions allowed. The patient understood, was satisfied, and agreed to proceed.     HPI: [place] is a 67 y.o. female who presents for EGD and colonoscopy.  Medical history as below.  Tolerated the prep.  No recent chest pain or shortness of breath.  No abdominal pain today.  Past Medical History:  Diagnosis Date   Anxiety    Cataract    NS OD   Chest pain 08/14/2016   Chronic systolic heart failure (HCC)    Complication of anesthesia    Per patient "needs a lot of anesthesia w/surgery" ,I had toruble with a spinal block not taking "   Diabetes mellitus without complication (HCC)    Type 2   Diabetic retinopathy (HCC)    BDR   Full dentures    GERD (gastroesophageal reflux disease)    Heart failure (HCC)    History of surgery on arm    right arm broken   Hyperlipidemia    Hypertension    never diagnosed by physician per pt   Macular degeneration    Obesity     Past Surgical History:  Procedure Laterality Date   BIOPSY  09/30/2017   Procedure: BIOPSY;  Surgeon: Beverley Fiedler, MD;  Location: [facility];  Service: Gastroenterology;;   CERVICAL CONIZATION W/BX N/A 09/10/2017   Procedure: CONIZATION CERVIX WITH BIOPSY - COLD KNIFE;  Surgeon: Allie Bossier, MD;  Location: [facility];  Service: Gynecology;  Laterality: N/A;   CESAREAN SECTION  1989   x 1   CHOLECYSTECTOMY     gall stones   COLONOSCOPY WITH PROPOFOL N/A 09/30/2017   Procedure: COLONOSCOPY WITH PROPOFOL;  Surgeon: Beverley Fiedler, MD;   Location: [facility];  Service: Gastroenterology;  Laterality: N/A;   DILATION AND CURETTAGE OF UTERUS N/A 09/10/2017   Procedure: DILATATION AND CURETTAGE;  Surgeon: Allie Bossier, MD;  Location: [facility];  Service: Gynecology;  Laterality: N/A;   MULTIPLE TOOTH EXTRACTIONS     dentures upper and lowers   POLYPECTOMY  09/30/2017   Procedure: POLYPECTOMY;  Surgeon: Beverley Fiedler, MD;  Location: [facility];  Service: Gastroenterology;;   RIGHT/LEFT HEART CATH AND CORONARY ANGIOGRAPHY N/A 02/23/2022   Procedure: RIGHT/LEFT HEART CATH AND CORONARY ANGIOGRAPHY;  Surgeon: Lyn Records, MD;  Location: [facility];  Service: Cardiovascular;  Laterality: N/A;   TONSILLECTOMY      Prior to Admission medications   Medication Sig Start Date End Date Taking? Authorizing Provider  acetaminophen (TYLENOL) 500 MG tablet Take 1,500 mg by mouth every 6 (six) hours as needed for moderate pain or headache.    Yes [provider]  aspirin EC 81 MG tablet Take 1 tablet (81 mg total) by mouth daily. 03/07/22  Yes Reymundo Poll, MD  carvedilol (COREG) 6.25 MG tablet Take 1 tablet (6.25 mg total) by mouth 2 (two) times daily with a meal. 03/22/22  Yes Reymundo Poll, MD  dapagliflozin propanediol (FARXIGA) 10 MG TABS tablet Take 1 tablet (10 mg total) by  mouth daily before breakfast. 05/22/22  Yes Sabharwal, Aditya, DO  ferrous sulfate 325 (65 FE) MG tablet Take 1 tablet (325 mg total) by mouth every other day. 02/27/22 07/30/22 Yes Gwenevere Abbot, MD  neomycin-bacitracin-polymyxin (NEOSPORIN) [phone] ointment Apply 1 Application topically daily as needed (On stomach).   Yes [provider]  sacubitril-valsartan (ENTRESTO) 24-26 MG Take 1 tablet by mouth 2 (two) times daily. 07/04/22  Yes Reymundo Poll, MD  Semaglutide,0.25 or 0.5MG /DOS, 2 MG/3ML SOPN Inject 0.25 mg into the skin once a week. 01/22/22  Yes Reymundo Poll, MD  spironolactone ([name]) 25 MG tablet Take 1 tablet (25  mg total) by mouth daily. 03/22/22  Yes Reymundo Poll, MD  torsemide (DEMADEX) 20 MG tablet TAKE 2 TABLETS(40 MG) BY MOUTH DAILY 06/21/22  Yes Reymundo Poll, MD  Benzocaine, Topical, (BOIL EASE MAXIMUM STRENGTH) 20 % OINT Apply 1 application  topically daily as needed (On Stomach).    [provider]  dicyclomine (BENTYL) 20 MG tablet Take 1 tablet (20 mg total) by mouth 2 (two) times daily as needed for spasms (abdominal pain). 02/07/22   Reymundo Poll, MD  lactulose (CHRONULAC) 10 GM/15ML solution TAKE 15 MLS BY MOUTH TWICE DAILY AS NEEDED FOR MILD CONSTIPATION 03/14/22   Reymundo Poll, MD  Menthol, Topical Analgesic, (BIOFREEZE EX) Apply 1 application  topically daily as needed (back pain).    [provider]  Na Sulfate-K Sulfate-Mg Sulf 17.5-3.13-1.6 GM/177ML SOLN Use as directed; may use generic; goodrx card if insurance will not cover generic 04/26/22   Doree Albee, PA-C    Current Facility-Administered Medications  Medication Dose Route Frequency Provider Last Rate Last Admin   0.9 %  sodium chloride infusion   Intravenous Continuous Quentin Mulling R, PA-C       lactated ringers infusion   Intravenous Continuous [place] Grayer, Carie Caddy, MD 20 mL/hr at 07/31/22 0853 New Bag at 07/31/22 0853    Allergies as of 04/26/2022 - Review Complete 04/21/2022  Allergen Reaction Noted   Percocet [oxycodone-acetaminophen] Itching 09/13/2017    Family History  Problem Relation Age of Onset   Kidney disease Mother    Heart disease Mother    Diabetes Mother    Seizures Father    Heart disease Father    Other Father    Breast cancer Neg Hx     Social History   Socioeconomic History   Marital status: Divorced    Spouse name: Not on file   Number of children: 1   Years of education: Not on file   Highest education level: High school graduate  Occupational History   Occupation: disability  Tobacco Use   Smoking status: Former    Packs/day: 0.25    Years:  6.00    Additional pack years: 0.00    Total pack years: 1.50    Types: Cigarettes   Smokeless tobacco: Never   Tobacco comments:    quit 36 yrs ago  Vaping Use   Vaping Use: Never used  Substance and Sexual Activity   Alcohol use: Yes    Comment: occasionally   Drug use: No   Sexual activity: Not Currently    Birth control/protection: None  Other Topics Concern   Not on file  Social History Narrative   Not on file   Social Determinants of Health   Financial Resource Strain: High Risk (02/26/2022)   Overall Financial Resource Strain (CARDIA)    Difficulty of Paying Living Expenses: Very hard  Food Insecurity: No Food  Insecurity (02/21/2022)   Hunger Vital Sign    Worried About Running Out of Food in the Last Year: Never true    Ran Out of Food in the Last Year: Never true  Recent Concern: Food Insecurity - Food Insecurity Present (02/14/2022)   Hunger Vital Sign    Worried About Running Out of Food in the Last Year: Sometimes true    Ran Out of Food in the Last Year: Sometimes true  Transportation Needs: No Transportation Needs (03/01/2022)   PRAPARE - Administrator, Civil Service (Medical): No    Lack of Transportation (Non-Medical): No  Physical Activity: Inactive (02/14/2022)   Exercise Vital Sign    Days of Exercise per Week: 0 days    Minutes of Exercise per Session: 0 min  Stress: No Stress Concern Present (02/14/2022)   [employer] of Occupational Health - Occupational Stress Questionnaire    Feeling of Stress : Not at all  Social Connections: Moderately Isolated (02/14/2022)   Social Connection and Isolation Panel [NHANES]    Frequency of Communication with Friends and Family: Twice a week    Frequency of Social Gatherings with Friends and Family: Once a week    Attends Religious Services: More than 4 times per year    Active Member of [employer] or Organizations: No    Attends Banker Meetings: Never    Marital Status: Divorced   Catering manager Violence: Not At Risk (02/21/2022)   Humiliation, Afraid, Rape, and Kick questionnaire    Fear of Current or Ex-Partner: No    Emotionally Abused: No    Physically Abused: No    Sexually Abused: No    Physical Exam: Vital signs in last 24 hours: @BP  (!) 158/87   Pulse 86   Temp 97.6 F (36.4 C) (Temporal)   Resp 13   Ht 5\' 10"  (1.778 m)   Wt 131.5 kg   SpO2 98%   BMI 41.61 kg/m  GEN: NAD EYE: Sclerae anicteric ENT: MMM CV: Non-tachycardic Pulm: CTA b/l GI: Soft, NT/ND NEURO:  Alert & Oriented x 3   Erick Blinks, MD [facility]  07/31/2022 9:05 AM

## 2022-07-31 NOTE — Op Note (Signed)
[facility] Patient Name: [place] Procedure Date: 07/31/2022 MRN: [id number] Attending MD: Beverley Fiedler , MD, [id number] Date of Birth: 10/21/1955 CSN: [id number] Age: 67 Admit Type: Outpatient Procedure:                Colonoscopy Indications:              Iron deficiency anemia, personal hx of adenomatous                            colon polyp (last exam July 2019, 2 adenomas < 1 cm) Providers:                Carie Caddy. Rhea Belton, MD, Marge Duncans, RN, Priscella Mann, Technician Referring MD:             Reymundo Poll Medicines:                Monitored Anesthesia Care Complications:            No immediate complications. Estimated Blood Loss:     Estimated blood loss: none. Procedure:                Pre-Anesthesia Assessment:                           - Prior to the procedure, a History and Physical                            was performed, and patient medications and                            allergies were reviewed. The patient's tolerance of                            previous anesthesia was also reviewed. The risks                            and benefits of the procedure and the sedation                            options and risks were discussed with the patient.                            All questions were answered, and informed consent                            was obtained. Prior Anticoagulants: The patient has                            taken no anticoagulant or antiplatelet agents. ASA                            Grade Assessment: III - A patient with severe  systemic disease. After reviewing the risks and                            benefits, the patient was deemed in satisfactory                            condition to undergo the procedure.                           After obtaining informed consent, the colonoscope                            was passed under direct vision. Throughout the                             procedure, the patient's blood pressure, pulse, and                            oxygen saturations were monitored continuously. The                            [id number]-HQ190L ([id number]) Olympus colonoscope was                            introduced through the anus and advanced to the                            terminal ileum. The colonoscopy was performed                            without difficulty. The patient tolerated the                            procedure well. The quality of the bowel                            preparation was good. The terminal ileum, ileocecal                            valve, appendiceal orifice, and rectum were                            photographed. Scope In: 9:40:52 AM Scope Out: 9:55:37 AM Scope Withdrawal Time: 0 hours 12 minutes 26 seconds  Total Procedure Duration: 0 hours 14 minutes 45 seconds  Findings:      The digital rectal exam was normal.      The terminal ileum appeared normal.      Multiple medium-mouthed and small-mouthed diverticula were found in the       sigmoid colon, descending colon and hepatic flexure.      The exam was otherwise without abnormality on direct and retroflexion       views. Impression:               - The examined portion of the ileum was normal.                           -  Moderate diverticulosis in the sigmoid colon, in                            the descending colon and at the hepatic flexure.                           - The examination was otherwise normal on direct                            and retroflexion views.                           - No specimens collected. Moderate Sedation:      N/A Recommendation:           - Patient has a contact number available for                            emergencies. The signs and symptoms of potential                            delayed complications were discussed with the                            patient. Return to normal activities tomorrow.                             Written discharge instructions were provided to the                            patient.                           - Resume previous diet.                           - Continue present medications.                           - Repeat colonoscopy in 10 years for surveillance.                           - See the other procedure note for documentation of                            additional recommendations. Procedure Code(s):        --- Professional ---                           [phone], Colonoscopy, flexible; diagnostic, including                            collection of specimen(s) by brushing or washing,                            when performed (separate procedure) Diagnosis Code(s):        ---  Professional ---                           D50.9, Iron deficiency anemia, unspecified                           K57.30, Diverticulosis of large intestine without                            perforation or abscess without bleeding CPT copyright 2022 American Medical Association. All rights reserved. The codes documented in this report are preliminary and upon coder review may  be revised to meet current compliance requirements. Beverley Fiedler, MD 07/31/2022 10:13:09 AM This report has been signed electronically. Number of Addenda: 0

## 2022-07-31 NOTE — Anesthesia Postprocedure Evaluation (Signed)
Anesthesia Post Note  Patient: [employer]  Procedure(s) Performed: ESOPHAGOGASTRODUODENOSCOPY (EGD) WITH PROPOFOL COLONOSCOPY WITH PROPOFOL HEMOSTASIS CLIP PLACEMENT BIOPSY     Patient location during evaluation: PACU Anesthesia Type: MAC Level of consciousness: awake and alert Pain management: pain level controlled Vital Signs Assessment: post-procedure vital signs reviewed and stable Respiratory status: spontaneous breathing Cardiovascular status: stable Anesthetic complications: no   No notable events documented.  Last Vitals:  Vitals:   07/31/22 1010 07/31/22 1020  BP: 111/68 122/86  Pulse: 97 89  Resp: (!) 22 (!) 29  Temp:    SpO2: 100% 99%    Last Pain:  Vitals:   07/31/22 1020  TempSrc:   PainSc: 0-No pain                 Lewie Loron

## 2022-07-31 NOTE — Op Note (Signed)
[facility] Patient Name: [place] Procedure Date: 07/31/2022 MRN: [id number] Attending MD: Beverley Fiedler , MD, [id number] Date of Birth: June 02, 1955 CSN: [id number] Age: 67 Admit Type: Outpatient Procedure:                Upper GI endoscopy Indications:              Iron deficiency anemia, question of cirrhosis                            (nodular contour of liver by CT, elastography low                            risk for advanced fibrosis; plts normal, INR 1.1) Providers:                Carie Caddy. Rhea Belton, MD, Marge Duncans, RN, Priscella Mann, Technician Referring MD:             Reymundo Poll Medicines:                Monitored Anesthesia Care Complications:            No immediate complications. Estimated Blood Loss:     Estimated blood loss was minimal. Procedure:                Pre-Anesthesia Assessment:                           - Prior to the procedure, a History and Physical                            was performed, and patient medications and                            allergies were reviewed. The patient's tolerance of                            previous anesthesia was also reviewed. The risks                            and benefits of the procedure and the sedation                            options and risks were discussed with the patient.                            All questions were answered, and informed consent                            was obtained. Prior Anticoagulants: The patient has                            taken no anticoagulant or antiplatelet agents. ASA  Grade Assessment: III - A patient with severe                            systemic disease. After reviewing the risks and                            benefits, the patient was deemed in satisfactory                            condition to undergo the procedure.                           After obtaining informed consent, the endoscope  was                            passed under direct vision. Throughout the                            procedure, the patient's blood pressure, pulse, and                            oxygen saturations were monitored continuously. The                            GIF-H190 ([id number]) Olympus endoscope was introduced                            through the mouth, and advanced to the second part                            of duodenum. The upper GI endoscopy was                            accomplished without difficulty. The patient                            tolerated the procedure well. Scope In: Scope Out: Findings:      The examined esophagus was normal.      Red blood was found in the gastric body.      Two oozing cratered gastric ulcers one with active oozing and the other       with adherent clot were found in the gastric body. The largest lesion       was 5 mm in largest dimension. For hemostasis, three hemostatic clips       were successfully placed (MR conditional). Clip manufacturer: [employer]. There was no bleeding at the end of the maneuver.      Multiple 3 to 7 mm sessile polyps and areas of nodularity with       friability were found in the gastric fundus, in the gastric body and at       the proximal antrum. Query friable hyperplastic polyps. Biopsies were       taken with a cold forceps for histology.      The examined duodenum was normal. Impression:               -  Normal esophagus.                           - Red blood in the gastric body.                           - Oozing gastric ulcers with adherent clot. Clips                            (MR conditional) were placed. Clip manufacturer:                            [employer].                           - Multiple gastric polyps. Biopsied.                           - Normal examined duodenum.                           - No evidence of varices or portal hypertension.                           - Gastric  findings explain IDA. Moderate Sedation:      N/A Recommendation:           - Patient has a contact number available for                            emergencies. The signs and symptoms of potential                            delayed complications were discussed with the                            patient. Return to normal activities tomorrow.                            Written discharge instructions were provided to the                            patient.                           - Resume previous diet.                           - Continue present medications.                           - Begin pantoprazole 40 mg twice daily before meals.                           - Await pathology results.                           - Repeat  upper endoscopy in 3 months to check                            healing. Procedure Code(s):        --- Professional ---                           [phone], 59, Esophagogastroduodenoscopy, flexible,                            transoral; with control of bleeding, any method                           43239, Esophagogastroduodenoscopy, flexible,                            transoral; with biopsy, single or multiple Diagnosis Code(s):        --- Professional ---                           K92.2, Gastrointestinal hemorrhage, unspecified                           K25.4, Chronic or unspecified gastric ulcer with                            hemorrhage                           K31.7, Polyp of stomach and duodenum                           D50.9, Iron deficiency anemia, unspecified CPT copyright 2022 American Medical Association. All rights reserved. The codes documented in this report are preliminary and upon coder review may  be revised to meet current compliance requirements. Beverley Fiedler, MD 07/31/2022 10:10:54 AM This report has been signed electronically. Number of Addenda: 0

## 2022-08-01 ENCOUNTER — Encounter: Payer: Medicare Other | Admitting: Internal Medicine

## 2022-08-01 ENCOUNTER — Encounter (HOSPITAL_COMMUNITY): Payer: Self-pay | Admitting: Internal Medicine

## 2022-08-01 LAB — SURGICAL PATHOLOGY

## 2022-08-02 ENCOUNTER — Telehealth: Payer: Self-pay | Admitting: *Deleted

## 2022-08-02 ENCOUNTER — Other Ambulatory Visit: Payer: Self-pay

## 2022-08-02 MED ORDER — DOXYCYCLINE HYCLATE 100 MG PO CAPS: 100.0000 mg | ORAL_CAPSULE | Freq: Two times a day (BID) | ORAL | 0 refills | Status: DC

## 2022-08-02 MED ORDER — METRONIDAZOLE 250 MG PO TABS: 250.0000 mg | ORAL_TABLET | Freq: Four times a day (QID) | ORAL | 0 refills | Status: DC

## 2022-08-02 MED ORDER — BISMUTH 262 MG PO CHEW: 524.0000 mg | CHEWABLE_TABLET | Freq: Four times a day (QID) | ORAL | 0 refills | Status: DC

## 2022-08-02 NOTE — Progress Notes (Signed)
  Care Coordination Note  08/02/2022 Name: Tina Lara MRN: [id number] DOB: April 20, 1955  Tina Lara is a 67 y.o. year old female who is a primary care patient of Reymundo Poll, MD and is actively engaged with the care management team. I reached out to [place] [employer] by phone today to assist with re-scheduling a follow up visit with the RN Case Manager  Follow up plan: Unsuccessful telephone outreach attempt made. A HIPAA compliant phone message was left for the patient providing contact information and requesting a return call.   [facility]  Care Coordination Care Guide  Direct Dial: [phone]

## 2022-08-06 ENCOUNTER — Telehealth: Payer: Self-pay | Admitting: Internal Medicine

## 2022-08-06 ENCOUNTER — Other Ambulatory Visit (HOSPITAL_COMMUNITY): Payer: Self-pay

## 2022-08-06 NOTE — Telephone Encounter (Signed)
Reached back out to patient regarding our previous encounter (07/27/22) for patient assistance Tina Lara was one of the medications we mentioned).   Informed patient she is now in the donut hole. Attempted test claim for generic farxiga. Insurance rejected and needs a PA for the generic (they prefer brand [place] or [place]).   Informed patient she will need to apply for Low Income Subsidy with [employer] in the meantime while she waits for [facility] decision. Provided [employer] phone number again and patient will call.

## 2022-08-06 NOTE — Telephone Encounter (Signed)
Pt requesting a call back about the following medication.  Pt states the medication was $47.00 and now [facility] will not pay for it and it will now be $178.00.  Pt uses the [facility] located on [address] now instead.   dapagliflozin propanediol (FARXIGA) 10 MG TABS tablet

## 2022-08-06 NOTE — Telephone Encounter (Signed)
Spoke with patient again regarding assistance. Pt has applied for medicaid but hasn't heard back. In the meantime I provided [employer]'s phone number again for her to apply for LIS/ Extra help.   Pt is currently in donut hole.

## 2022-08-06 NOTE — Progress Notes (Signed)
  Care Coordination Note  08/06/2022 Name: Tina Lara MRN: [id number] DOB: 1956-03-31  Tina Lara is a 67 y.o. year old female who is a primary care patient of Reymundo Poll, MD and is actively engaged with the care management team. I reached out to [place] by phone today to assist with re-scheduling a follow up visit with the RN Case Manager  Follow up plan: Telephone appointment with care management team member scheduled for:08/17/22  [facility] Coordination Care Guide  Direct Dial: [phone]

## 2022-08-14 ENCOUNTER — Telehealth (HOSPITAL_COMMUNITY): Payer: Self-pay

## 2022-08-14 ENCOUNTER — Other Ambulatory Visit (HOSPITAL_COMMUNITY): Payer: Self-pay

## 2022-08-14 NOTE — Telephone Encounter (Signed)
Advanced Heart Failure Patient Advocate Encounter  Returned patient call for assistance, as patient is going into the coverage gap for this year. Patient may be eligible for savings on Carvedilol, Marcelline Deist, Entresto, Spironolactone. No answer, left voicemail.  Burnell Blanks, CPhT Rx Patient Advocate Phone: [phone]

## 2022-08-14 NOTE — Telephone Encounter (Signed)
Advanced Heart Failure Patient Advocate Encounter  The patient was approved for a [employer] grant that will help cover the cost of Carvedilol, Entresto, Farxiga, Spironolactone.  Total amount awarded, $10,000.  Effective: 07/15/2022 - 07/14/2023.  BIN [id number] PCN [id number] [id number] ID [id number]  Provided processing information to [facility] directly and confirmed $0 copay. Informed patient by phone  Burnell Blanks, CPhT Rx Patient Advocate Phone: [phone]

## 2022-08-14 NOTE — Telephone Encounter (Signed)
Patient is in donut hole and cannot afford her [place] and Sherryll Burger. Can we help with assistance?

## 2022-08-14 NOTE — Telephone Encounter (Signed)
Left voicemail, will try back

## 2022-08-17 ENCOUNTER — Ambulatory Visit: Payer: Self-pay

## 2022-08-17 NOTE — Patient Instructions (Signed)
Visit Information  Thank you for taking time to visit with me today. Please don't hesitate to contact me if I can be of assistance to you.   Following are the goals we discussed today:   Goals Addressed             This Visit's Progress    I want to manage and monitor my CHF and DM II       Patient Goals/Self Care Activities: -Patient/Caregiver will self-administer medications as prescribed as evidenced by self-report/primary caregiver report  -Patient/Caregiver will attend all scheduled provider appointments as evidenced by clinician review of documented attendance to scheduled appointments and patient/caregiver report -Patient/Caregiver will call pharmacy for medication refills as evidenced by patient report and review of pharmacy fill history as appropriate -Patient/Caregiver will call provider office for new concerns or questions as evidenced by review of documented incoming telephone call notes and patient report -Patient/Caregiver verbalizes understanding of plan -Patient/Caregiver will focus on medication adherence by taking medications as prescribed  -Weigh daily and record (notify MD with 3 lb weight gain over night or 5 lb in a week) -Follow CHF Action Plan -Continue to monitor you food and fluid intake -check blood sugar at prescribed times -record values and write them down take them to all doctor visits            Our next appointment is by telephone on 09/21/22 at 10  am  Please call the care guide team at [phone] if you need to cancel or reschedule your appointment.   If you are experiencing a Mental Health or Behavioral Health Crisis or need someone to talk to, please call 1-800-273-TALK (toll free, 24 hour hotline)  Patient verbalizes understanding of instructions and care plan provided today and agrees to view in MyChart. Active MyChart status and patient understanding of how to access instructions and care plan via MyChart confirmed with patient.      Juanell Fairly RN, BSN, [facility] Care Coordinator [facility]   Phone: [phone]

## 2022-08-17 NOTE — Patient Outreach (Signed)
  Care Coordination   Follow Up Visit Note   08/17/2022 Name: Tina Lara MRN: [id number] DOB: [birth year]  Tina Lara is a 67 y.o. year old female who sees Tina Poll, MD for primary care. I spoke with  Tina Lara by phone today.  What matters to the patients health and wellness today?  Tina Lara mentioned that her blood sugar had been high because she couldn't afford her [place] or Entresto. However, she spoke with Tina Lara at the heart and vascular clinic and managed to get her medications for free through a grant. Her blood sugar was 114 yesterday, and she did not experience any swelling, chest pain, or shortness of breath.    Goals Addressed             This Visit's Progress    I want to manage and monitor my CHF and DM II       Patient Goals/Self Care Activities: -Patient/Caregiver will self-administer medications as prescribed as evidenced by self-report/primary caregiver report  -Patient/Caregiver will attend all scheduled provider appointments as evidenced by clinician review of documented attendance to scheduled appointments and patient/caregiver report -Patient/Caregiver will call pharmacy for medication refills as evidenced by patient report and review of pharmacy fill history as appropriate -Patient/Caregiver will call provider office for new concerns or questions as evidenced by review of documented incoming telephone call notes and patient report -Patient/Caregiver verbalizes understanding of plan -Patient/Caregiver will focus on medication adherence by taking medications as prescribed  -Weigh daily and record (notify MD with 3 lb weight gain over night or 5 lb in a week) -Follow CHF Action Plan -Continue to monitor you food and fluid intake -check blood sugar at prescribed times -record values and write them down take them to all doctor visits            SDOH assessments and interventions completed:  No     Care Coordination  Interventions:  Yes, provided   Interventions Today    Flowsheet Row Most Recent Value  Chronic Disease   Chronic disease during today's visit Diabetes, Congestive Heart Failure (CHF)  General Interventions   General Interventions Discussed/Reviewed General Interventions Reviewed  Education Interventions   Education Provided Provided Education  Provided Verbal Education On Blood Sugar Monitoring, Medication  Pharmacy Interventions   Pharmacy Dicussed/Reviewed Pharmacy Topics Discussed        Follow up plan: Follow up call scheduled for 09/21/22  10 am    Encounter Outcome:  Pt. Visit Completed   Tina Fairly RN, BSN, [facility] Care Coordinator [facility]   Phone: [phone]

## 2022-09-04 ENCOUNTER — Other Ambulatory Visit: Payer: Self-pay

## 2022-09-04 ENCOUNTER — Ambulatory Visit
Admission: EM | Admit: 2022-09-04 | Discharge: 2022-09-04 | Disposition: A | Discharge status: Home / Self Care | Payer: Medicare Other | Attending: Family Medicine | Admitting: Family Medicine

## 2022-09-04 ENCOUNTER — Telehealth (HOSPITAL_COMMUNITY): Payer: Self-pay | Admitting: Vascular Surgery

## 2022-09-04 DIAGNOSIS — Z20822 Contact with and (suspected) exposure to covid-19: Secondary | ICD-10-CM

## 2022-09-04 DIAGNOSIS — R059 Cough, unspecified: Secondary | ICD-10-CM

## 2022-09-04 DIAGNOSIS — R52 Pain, unspecified: Secondary | ICD-10-CM | POA: Diagnosis not present

## 2022-09-04 DIAGNOSIS — U071 COVID-19: Secondary | ICD-10-CM

## 2022-09-04 LAB — POCT INFLUENZA A/B
Influenza A, POC: NEGATIVE
Influenza B, POC: NEGATIVE

## 2022-09-04 LAB — POC SARS CORONAVIRUS 2 AG -  ED: SARS Coronavirus 2 Ag: POSITIVE — AB

## 2022-09-04 MED ORDER — PAXLOVID (300/100) 20 X 150 MG & 10 X 100MG PO TBPK: 3.0000 | ORAL_TABLET | Freq: Two times a day (BID) | ORAL | 0 refills | Status: AC

## 2022-09-04 MED ORDER — HYDROCODONE BIT-HOMATROP MBR 5-1.5 MG/5ML PO SOLN: 5.0000 mL | Freq: Three times a day (TID) | ORAL | 0 refills | Status: DC | PRN

## 2022-09-04 MED ORDER — PAXLOVID (150/100) 10 X 150 MG & 10 X 100MG PO TBPK: 2.0000 | ORAL_TABLET | Freq: Two times a day (BID) | ORAL | 0 refills | Status: AC

## 2022-09-04 MED ORDER — BENZONATATE 200 MG PO CAPS: 200.0000 mg | ORAL_CAPSULE | Freq: Three times a day (TID) | ORAL | 0 refills | Status: AC | PRN

## 2022-09-04 NOTE — ED Triage Notes (Signed)
[facility] to [facility] of chills, headache, bodyaches, cough, sore throat, . Symptoms started sunday. Pt has been traveling and came home Sunday and has been sick since then thinks she came in contact with covid.

## 2022-09-04 NOTE — Telephone Encounter (Signed)
Lvm to make f/u w/ AD

## 2022-09-04 NOTE — Discharge Instructions (Addendum)
Advised patient of positive COVID-19 status.  Advised patient to take medication as directed with food to completion.  Advised may use Tessalon Perles daily or as needed for cough.  Advised may use Hycodan at night prior to sleep for cough due to sedative effects.  Advised patient not to use cough medications together.  Encouraged increase daily water intake to 64 ounces per day while taking these medications.  Advised patient to self quarantine for the next 5 days.  Advised if symptoms worsen and/or unresolved please follow-up with PCP or here for further evaluation.

## 2022-09-04 NOTE — ED Provider Notes (Signed)
[facility]    CSN: [id number] Arrival date & time: 09/04/22  1808      History   Chief Complaint Chief Complaint  Patient presents with   Generalized Body Aches    HPI Tina Lara is a 67 y.o. female.   HPI Tina Lara 67 year old female presents with chills, headache, body aches, myalgias, cough and sore throat for 2 days.  Patient reports recently traveling and came in contact with someone who had COVID-19.  PMH significant for chronic systolic heart failure, morbid obesity, and T2DM without complication.  Past Medical History:  Diagnosis Date   Anxiety    Cataract    NS OD   Chest pain 08/14/2016   Chronic systolic heart failure (HCC)    Complication of anesthesia    Per patient "needs a lot of anesthesia w/surgery" ,I had toruble with a spinal block not taking "   Diabetes mellitus without complication (HCC)    Type 2   Diabetic retinopathy (HCC)    BDR   Full dentures    GERD (gastroesophageal reflux disease)    Heart failure (HCC)    History of surgery on arm    right arm broken   Hyperlipidemia    Hypertension    never diagnosed by physician per pt   Macular degeneration    Obesity     Patient Active Problem List   Diagnosis Date Noted   Acute gastric ulcer with hemorrhage 07/31/2022   Gastric polyps 07/31/2022   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   CKD (chronic kidney disease), stage III (HCC) 03/07/2022   Chronic HFrEF (heart failure with reduced ejection fraction) (HCC) 02/21/2022   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   Cirrhosis (HCC) 02/07/2022   Constipation 02/07/2022   GERD (gastroesophageal reflux disease) 01/15/2022   Intertrigo 09/06/2021   Iron deficiency anemia 08/24/2020   HLD (hyperlipidemia) 05/13/2019   Abnormal Pap smear of cervix 05/07/2017   HTN (hypertension) 08/29/2016   Health care maintenance 08/29/2016   Diabetes mellitus without complication (HCC) 08/29/2016    Past Surgical  History:  Procedure Laterality Date   BIOPSY  09/30/2017   Procedure: BIOPSY;  Surgeon: Beverley Fiedler, MD;  Location: [facility];  Service: Gastroenterology;;   BIOPSY  07/31/2022   Procedure: BIOPSY;  Surgeon: Beverley Fiedler, MD;  Location: [facility];  Service: Gastroenterology;;   CERVICAL CONIZATION W/BX N/A 09/10/2017   Procedure: CONIZATION CERVIX WITH BIOPSY - COLD KNIFE;  Surgeon: Allie Bossier, MD;  Location: [facility];  Service: Gynecology;  Laterality: N/A;   CESAREAN SECTION  1989   x 1   CHOLECYSTECTOMY     gall stones   COLONOSCOPY WITH PROPOFOL N/A 09/30/2017   Procedure: COLONOSCOPY WITH PROPOFOL;  Surgeon: Beverley Fiedler, MD;  Location: [facility];  Service: Gastroenterology;  Laterality: N/A;   COLONOSCOPY WITH PROPOFOL N/A 07/31/2022   Procedure: COLONOSCOPY WITH PROPOFOL;  Surgeon: Beverley Fiedler, MD;  Location: [facility];  Service: Gastroenterology;  Laterality: N/A;   DILATION AND CURETTAGE OF UTERUS N/A 09/10/2017   Procedure: DILATATION AND CURETTAGE;  Surgeon: Allie Bossier, MD;  Location: [facility];  Service: Gynecology;  Laterality: N/A;   ESOPHAGOGASTRODUODENOSCOPY (EGD) WITH PROPOFOL N/A 07/31/2022   Procedure: ESOPHAGOGASTRODUODENOSCOPY (EGD) WITH PROPOFOL;  Surgeon: Beverley Fiedler, MD;  Location: [facility];  Service: Gastroenterology;  Laterality: N/A;   HEMOSTASIS CLIP PLACEMENT  07/31/2022   Procedure: HEMOSTASIS CLIP PLACEMENT;  Surgeon: Beverley Fiedler, MD;  Location: [facility];  Service: Gastroenterology;;   MULTIPLE TOOTH EXTRACTIONS     dentures upper and lowers   POLYPECTOMY  09/30/2017   Procedure: POLYPECTOMY;  Surgeon: Beverley Fiedler, MD;  Location: [facility];  Service: Gastroenterology;;   RIGHT/LEFT HEART CATH AND CORONARY ANGIOGRAPHY N/A 02/23/2022   Procedure: RIGHT/LEFT HEART CATH AND CORONARY ANGIOGRAPHY;  Surgeon: Lyn Records, MD;  Location: [facility];  Service: Cardiovascular;  Laterality: N/A;   TONSILLECTOMY      OB History   No  obstetric history on file.      Home Medications    Prior to Admission medications   Medication Sig Start Date End Date Taking? Authorizing Provider  benzonatate (TESSALON) 200 MG capsule Take 1 capsule (200 mg total) by mouth 3 (three) times daily as needed for up to 7 days. 09/04/22 09/11/22 Yes Trevor Iha, FNP  Bismuth 262 MG CHEW Chew 524 mg by mouth in the morning, at noon, in the evening, and at bedtime. 08/02/22   Pyrtle, Carie Caddy, MD  doxycycline (VIBRAMYCIN) 100 MG capsule Take 1 capsule (100 mg total) by mouth 2 (two) times daily. 08/02/22   Pyrtle, Carie Caddy, MD  HYDROcodone bit-homatropine (HYCODAN) 5-1.5 MG/5ML syrup Take 5 mLs by mouth every 8 (eight) hours as needed for cough. 09/04/22  Yes Trevor Iha, FNP  metroNIDAZOLE (FLAGYL) 250 MG tablet Take 1 tablet (250 mg total) by mouth 4 (four) times daily. 08/02/22   Pyrtle, Carie Caddy, MD  nirmatrelvir & ritonavir (PAXLOVID, 150/100,) 10 x 150 MG & 10 x 100MG  TBPK Take 2 tablets by mouth 2 (two) times daily for 5 days. 09/04/22 09/09/22 Yes Trevor Iha, FNP  nirmatrelvir & ritonavir (PAXLOVID, 300/100,) 20 x 150 MG & 10 x 100MG  TBPK Take 3 tablets by mouth 2 (two) times daily for 5 days. 09/04/22 09/09/22 Yes Trevor Iha, FNP  acetaminophen (TYLENOL) 500 MG tablet Take 1,500 mg by mouth every 6 (six) hours as needed for moderate pain or headache.     [provider]  aspirin EC 81 MG tablet Take 1 tablet (81 mg total) by mouth daily. 03/07/22   Reymundo Poll, MD  Benzocaine, Topical, (BOIL EASE MAXIMUM STRENGTH) 20 % OINT Apply 1 application  topically daily as needed (On Stomach).    [provider]  carvedilol (COREG) 6.25 MG tablet Take 1 tablet (6.25 mg total) by mouth 2 (two) times daily with a meal. 03/22/22   Reymundo Poll, MD  dapagliflozin propanediol (FARXIGA) 10 MG TABS tablet Take 1 tablet (10 mg total) by mouth daily before breakfast. 05/22/22   Sabharwal, Aditya, DO  dicyclomine (BENTYL) 20 MG tablet Take 1  tablet (20 mg total) by mouth 2 (two) times daily as needed for spasms (abdominal pain). 02/07/22   Reymundo Poll, MD  ferrous sulfate 325 (65 FE) MG tablet Take 1 tablet (325 mg total) by mouth every other day. 02/27/22 07/30/22  Gwenevere Abbot, MD  lactulose (CHRONULAC) 10 GM/15ML solution TAKE 15 MLS BY MOUTH TWICE DAILY AS NEEDED FOR MILD CONSTIPATION 03/14/22   Reymundo Poll, MD  Menthol, Topical Analgesic, (BIOFREEZE EX) Apply 1 application  topically daily as needed (back pain).    [provider]  neomycin-bacitracin-polymyxin (NEOSPORIN) [phone] ointment Apply 1 Application topically daily as needed (On stomach).    [provider]  pantoprazole (PROTONIX) 40 MG tablet Take 1 tablet (40 mg total) by mouth 2 (two) times daily before a meal. 07/31/22 01/27/23  Pyrtle, Carie Caddy, MD  sacubitril-valsartan Sherryll Burger) 24-26  MG Take 1 tablet by mouth 2 (two) times daily. 07/04/22   Reymundo Poll, MD  Semaglutide,0.25 or 0.5MG /DOS, 2 MG/3ML SOPN Inject 0.25 mg into the skin once a week. 01/22/22   Reymundo Poll, MD  spironolactone ([name]) 25 MG tablet Take 1 tablet (25 mg total) by mouth daily. 03/22/22   Reymundo Poll, MD  torsemide (DEMADEX) 20 MG tablet TAKE 2 TABLETS(40 MG) BY MOUTH DAILY 06/21/22   Reymundo Poll, MD    Family History Family History  Problem Relation Age of Onset   Kidney disease Mother    Heart disease Mother    Diabetes Mother    Seizures Father    Heart disease Father    Other Father    Breast cancer Neg Hx     Social History Social History   Tobacco Use   Smoking status: Former    Packs/day: 0.25    Years: 6.00    Additional pack years: 0.00    Total pack years: 1.50    Types: Cigarettes   Smokeless tobacco: Never   Tobacco comments:    quit 36 yrs ago  Vaping Use   Vaping Use: Never used  Substance Use Topics   Alcohol use: Yes    Comment: occasionally   Drug use: No     Allergies   Percocet  [oxycodone-acetaminophen]   Review of Systems Review of Systems  Respiratory:  Positive for cough.   All other systems reviewed and are negative.    Physical Exam Triage Vital Signs ED Triage Vitals  Enc Vitals Group     BP      Pulse      Resp      Temp      Temp src      SpO2      Weight      Height      Head Circumference      Peak Flow      Pain Score      Pain Loc      Pain Edu?      Excl. in GC?    No data found.  Updated Vital Signs BP 124/82   Pulse 79   Temp 98.6 F (37 C)   Resp 16   SpO2 98%      Physical Exam Vitals and nursing note reviewed.  Constitutional:      Appearance: Normal appearance. She is obese. She is ill-appearing.  HENT:     Head: Normocephalic and atraumatic.     Right Ear: Tympanic membrane, ear canal and external ear normal.     Left Ear: Tympanic membrane, ear canal and external ear normal.     Mouth/Throat:     Mouth: Mucous membranes are moist.     Pharynx: Oropharynx is clear.  Eyes:     Extraocular Movements: Extraocular movements intact.     Conjunctiva/sclera: Conjunctivae normal.     Pupils: Pupils are equal, round, and reactive to light.  Cardiovascular:     Rate and Rhythm: Normal rate and regular rhythm.     Pulses: Normal pulses.     Heart sounds: Normal heart sounds.  Pulmonary:     Effort: Pulmonary effort is normal.     Breath sounds: Normal breath sounds. No wheezing, rhonchi or rales.     Comments: Infrequent nonproductive cough noted on exam Musculoskeletal:        General: Normal range of motion.     Cervical back: Normal range of motion and neck  supple.  Skin:    General: Skin is warm and dry.  Neurological:     General: No focal deficit present.     Mental Status: She is alert and oriented to person, place, and time. Mental status is at baseline.  Psychiatric:        Mood and Affect: Mood normal.        Behavior: Behavior normal.      UC Treatments / Results  Labs (all labs ordered are  listed, but only abnormal results are displayed) Labs Reviewed  POC SARS CORONAVIRUS 2 AG -  ED - Abnormal; Notable for the following components:      Result Value   SARS Coronavirus 2 Ag Positive (*)    All other components within normal limits  POCT INFLUENZA A/B    EKG   Radiology No results found.  Procedures Procedures (including critical care time)  Medications Ordered in UC Medications - No data to display  Initial Impression / Assessment and Plan / UC Course  I have reviewed the triage vital signs and the nursing notes.  Pertinent labs & imaging results that were available during my care of the patient were reviewed by me and considered in my medical decision making (see chart for details).     MDM: 1.  COVID-19-Rx'd Paxlovid 150/100 x 10 x 150 mg &10 x 100 tbpk; 2.  Cough, unspecified type Rx'd Tessalon pearls 200 mg 3 times daily, as needed, Hycodan 5-1.5 mg / 5 mL syrup: Take 5 mL every 8 hours, as needed for cough.  Patient advised of sedative of effects of this medication.  Patient reports taking this medication in the past without adverse effects. Advised patient of positive COVID-19 status.  Advised patient to take medication as directed with food to completion.  Advised may use Tessalon Perles daily or as needed for cough.  Advised may use Hycodan at night prior to sleep for cough due to sedative effects.  Advised patient not to use cough medications together.  Encouraged increase daily water intake to 64 ounces per day while taking these medications.  Advised patient to self quarantine for the next 5 days.  Advised if symptoms worsen and/or unresolved please follow-up with PCP or here for further evaluation. Patient discharged home, hemodynamically stable.  Final Clinical Impressions(s) / UC Diagnoses   Final diagnoses:  Body aches  Contact with and (suspected) exposure to covid-19  COVID-19  Cough, unspecified type     Discharge Instructions      Advised  patient of positive COVID-19 status.  Advised patient to take medication as directed with food to completion.  Advised may use Tessalon Perles daily or as needed for cough.  Advised may use Hycodan at night prior to sleep for cough due to sedative effects.  Advised patient not to use cough medications together.  Encouraged increase daily water intake to 64 ounces per day while taking these medications.  Advised patient to self quarantine for the next 5 days.  Advised if symptoms worsen and/or unresolved please follow-up with PCP or here for further evaluation.     ED Prescriptions     Medication Sig Dispense Auth. Provider   nirmatrelvir & ritonavir (PAXLOVID, 300/100,) 20 x 150 MG & 10 x 100MG  TBPK Take 3 tablets by mouth 2 (two) times daily for 5 days. 30 tablet Trevor Iha, FNP   benzonatate (TESSALON) 200 MG capsule Take 1 capsule (200 mg total) by mouth 3 (three) times daily as needed for up  to 7 days. 40 capsule Trevor Iha, FNP   HYDROcodone bit-homatropine (HYCODAN) 5-1.5 MG/5ML syrup Take 5 mLs by mouth every 8 (eight) hours as needed for cough. 120 mL Trevor Iha, FNP   nirmatrelvir & ritonavir (PAXLOVID, 150/100,) 10 x 150 MG & 10 x 100MG  TBPK Take 2 tablets by mouth 2 (two) times daily for 5 days. 20 tablet Trevor Iha, FNP      I have reviewed the PDMP during this encounter.   Trevor Iha, FNP 09/04/22 [phone]

## 2022-09-06 ENCOUNTER — Telehealth: Payer: Self-pay

## 2022-09-06 ENCOUNTER — Other Ambulatory Visit: Payer: Self-pay

## 2022-09-06 DIAGNOSIS — D509 Iron deficiency anemia, unspecified: Secondary | ICD-10-CM

## 2022-09-06 DIAGNOSIS — K259 Gastric ulcer, unspecified as acute or chronic, without hemorrhage or perforation: Secondary | ICD-10-CM

## 2022-09-06 NOTE — Telephone Encounter (Signed)
-----   Message from Chrystie Nose, RN sent at 08/02/2022 11:20 AM EDT ----- Regarding: EGD at [facility] Pt needs egd at [facility] in august

## 2022-09-06 NOTE — Telephone Encounter (Signed)
Pt scheduled for EGD at [facility] 11/26/22 at 7:30am. Pt to arrive there at 6am. Case#[id number].  Spoke with patient and she is aware and will send all information via mychart. Abm referral entered in epic.

## 2022-09-11 ENCOUNTER — Telehealth: Payer: Self-pay | Admitting: *Deleted

## 2022-09-11 NOTE — Progress Notes (Signed)
  Care Coordination Note  09/11/2022 Name: [name] MRN: [id number] DOB: Oct 14, 1955  Tina Lara is a 67 y.o. year old female who is a primary care patient of Reymundo Poll, MD and is actively engaged with the care management team. I reached out to [employer] by phone today to assist with re-scheduling a follow up visit with the RN Case Manager  Follow up plan: Unsuccessful telephone outreach attempt made. A HIPAA compliant phone message was left for the patient providing contact information and requesting a return call.   [facility]  Care Coordination Care Guide  Direct Dial: [phone]

## 2022-09-17 ENCOUNTER — Other Ambulatory Visit: Payer: Self-pay

## 2022-09-17 DIAGNOSIS — I5043 Acute on chronic combined systolic (congestive) and diastolic (congestive) heart failure: Secondary | ICD-10-CM

## 2022-09-17 MED ORDER — TORSEMIDE 20 MG PO TABS: 40.0000 mg | ORAL_TABLET | Freq: Every day | ORAL | 0 refills | Status: DC

## 2022-09-19 ENCOUNTER — Encounter: Payer: Medicare Other | Admitting: Internal Medicine

## 2022-09-25 NOTE — Progress Notes (Unsigned)
  Care Coordination Note  09/25/2022 Name: Tina Lara MRN: [id number] DOB: 05-11-1955  Tina Lara is a 67 y.o. year old female who is a primary care patient of Reymundo Poll, MD and is actively engaged with the care management team. I reached out to [employer] by phone today to assist with re-scheduling a follow up visit with the RN Case Manager  Follow up plan: Unsuccessful telephone outreach attempt made. A HIPAA compliant phone message was left for the patient providing contact information and requesting a return call.  [facility]  Care Coordination Care Guide  Direct Dial: [phone]

## 2022-09-27 NOTE — Progress Notes (Signed)
  Care Coordination Note  09/27/2022 Name: [name] MRN: [id number] DOB: 05-23-1955  Tina Lara is a 67 y.o. year old female who is a primary care patient of Reymundo Poll, MD and is actively engaged with the care management team. I reached out to [employer] by phone today to assist with re-scheduling an initial visit with the RN Case Manager  Follow up plan: Unsuccessful telephone outreach attempt made. A HIPAA compliant phone message was left for the patient providing contact information and requesting a return call.  We have been unable to make contact with the patient for follow up.   [facility]  Care Coordination Care Guide  Direct Dial: [phone]

## 2022-10-03 ENCOUNTER — Encounter: Payer: Medicare Other | Admitting: Internal Medicine

## 2022-11-16 ENCOUNTER — Encounter (HOSPITAL_COMMUNITY): Payer: Self-pay | Admitting: Internal Medicine

## 2022-11-16 NOTE — Progress Notes (Signed)
Attempted to obtain medical history via telephone, unable to reach at this time. HIPAA compliant voicemail message left requesting return call to pre surgical testing department. 

## 2022-11-20 ENCOUNTER — Other Ambulatory Visit: Payer: Self-pay | Admitting: Internal Medicine

## 2022-11-20 DIAGNOSIS — I5043 Acute on chronic combined systolic (congestive) and diastolic (congestive) heart failure: Secondary | ICD-10-CM

## 2022-11-21 NOTE — Telephone Encounter (Signed)
Approved torsemide refill. Please have patient schedule follow up with me, she needs repeat labs.  Thanks.

## 2022-11-21 NOTE — Telephone Encounter (Signed)
Pt called - no answer; left message to call our to schedule an appt w/Dr Guilloud. Also sending a message to the front desk.

## 2022-11-23 ENCOUNTER — Other Ambulatory Visit: Payer: Self-pay

## 2022-11-23 ENCOUNTER — Ambulatory Visit (INDEPENDENT_AMBULATORY_CARE_PROVIDER_SITE_OTHER): Payer: Medicare Other

## 2022-11-23 ENCOUNTER — Ambulatory Visit
Admission: EM | Admit: 2022-11-23 | Discharge: 2022-11-23 | Disposition: A | Discharge status: Home / Self Care | Payer: Medicare Other | Attending: Family Medicine | Admitting: Family Medicine

## 2022-11-23 DIAGNOSIS — M79604 Pain in right leg: Secondary | ICD-10-CM | POA: Diagnosis not present

## 2022-11-23 DIAGNOSIS — S86111A Strain of other muscle(s) and tendon(s) of posterior muscle group at lower leg level, right leg, initial encounter: Secondary | ICD-10-CM

## 2022-11-23 DIAGNOSIS — R600 Localized edema: Secondary | ICD-10-CM

## 2022-11-23 DIAGNOSIS — R2241 Localized swelling, mass and lump, right lower limb: Secondary | ICD-10-CM

## 2022-11-23 DIAGNOSIS — M7989 Other specified soft tissue disorders: Secondary | ICD-10-CM | POA: Diagnosis not present

## 2022-11-23 MED ORDER — HYDROCODONE-ACETAMINOPHEN 5-325 MG PO TABS: 1.0000 | ORAL_TABLET | Freq: Four times a day (QID) | ORAL | 0 refills | Status: DC | PRN

## 2022-11-23 NOTE — Discharge Instructions (Addendum)
Patient advised of DVT study negative for deep vein thrombosis/blood clot but concerning for possible right calf muscle strain or tear.  Advised patient may take Norco daily, as needed for right lower leg pain.  Patient advised of sedative effects of this medication.  Encouraged increase daily water intake to 64 ounces per day while taking this medication.  Advised patient to follow-up with [facility] first thing Monday morning, 11/26/2022.

## 2022-11-23 NOTE — Progress Notes (Signed)
Incoming call from pt. Pt states she would like to cancel her EGD scheduled for 11/26/2022. Instructed patient to call [facility] to reschedule. Secure message sent to MD Pyrtle. Case cancelled.  Eulas Post, RN 11/23/22 4:03 PM

## 2022-11-23 NOTE — ED Triage Notes (Signed)
Right leg pain x 4 days

## 2022-11-23 NOTE — ED Provider Notes (Signed)
[facility]    CSN: [id number] Arrival date & time: 11/23/22  1324      History   Chief Complaint Chief Complaint  Patient presents with   Leg Pain    HPI [place] L Rametta is a 67 y.o. female.   HPI Very pleasant 67 year old female presents with right leg pain x 4 days.  Patient is a accompanied by her daughter this afternoon.  PMH significant for morbid obesity, chronic systolic heart failure, and T2DM without complication.  Past Medical History:  Diagnosis Date   Anxiety    Cataract    NS OD   Chest pain 08/14/2016   Chronic systolic heart failure (HCC)    Complication of anesthesia    Per patient "needs a lot of anesthesia w/surgery" ,I had toruble with a spinal block not taking "   Diabetes mellitus without complication (HCC)    Type 2   Diabetic retinopathy (HCC)    BDR   Full dentures    GERD (gastroesophageal reflux disease)    Heart failure (HCC)    History of surgery on arm    right arm broken   Hyperlipidemia    Hypertension    never diagnosed by physician per pt   Macular degeneration    Obesity     Patient Active Problem List   Diagnosis Date Noted   Acute gastric ulcer with hemorrhage 07/31/2022   Gastric polyps 07/31/2022   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   CKD (chronic kidney disease), stage III (HCC) 03/07/2022   Chronic HFrEF (heart failure with reduced ejection fraction) (HCC) 02/21/2022   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   Cirrhosis (HCC) 02/07/2022   Constipation 02/07/2022   GERD (gastroesophageal reflux disease) 01/15/2022   Intertrigo 09/06/2021   Iron deficiency anemia 08/24/2020   HLD (hyperlipidemia) 05/13/2019   Abnormal Pap smear of cervix 05/07/2017   HTN (hypertension) 08/29/2016   Health care maintenance 08/29/2016   Diabetes mellitus without complication (HCC) 08/29/2016    Past Surgical History:  Procedure Laterality Date   BIOPSY  09/30/2017   Procedure: BIOPSY;   Surgeon: Beverley Fiedler, MD;  Location: [facility];  Service: Gastroenterology;;   BIOPSY  07/31/2022   Procedure: BIOPSY;  Surgeon: Beverley Fiedler, MD;  Location: [facility];  Service: Gastroenterology;;   CERVICAL CONIZATION W/BX N/A 09/10/2017   Procedure: CONIZATION CERVIX WITH BIOPSY - COLD KNIFE;  Surgeon: Allie Bossier, MD;  Location: [facility];  Service: Gynecology;  Laterality: N/A;   CESAREAN SECTION  1989   x 1   CHOLECYSTECTOMY     gall stones   COLONOSCOPY WITH PROPOFOL N/A 09/30/2017   Procedure: COLONOSCOPY WITH PROPOFOL;  Surgeon: Beverley Fiedler, MD;  Location: [facility];  Service: Gastroenterology;  Laterality: N/A;   COLONOSCOPY WITH PROPOFOL N/A 07/31/2022   Procedure: COLONOSCOPY WITH PROPOFOL;  Surgeon: Beverley Fiedler, MD;  Location: [facility];  Service: Gastroenterology;  Laterality: N/A;   DILATION AND CURETTAGE OF UTERUS N/A 09/10/2017   Procedure: DILATATION AND CURETTAGE;  Surgeon: Allie Bossier, MD;  Location: [facility];  Service: Gynecology;  Laterality: N/A;   ESOPHAGOGASTRODUODENOSCOPY (EGD) WITH PROPOFOL N/A 07/31/2022   Procedure: ESOPHAGOGASTRODUODENOSCOPY (EGD) WITH PROPOFOL;  Surgeon: Beverley Fiedler, MD;  Location: [facility];  Service: Gastroenterology;  Laterality: N/A;   HEMOSTASIS CLIP PLACEMENT  07/31/2022   Procedure: HEMOSTASIS CLIP PLACEMENT;  Surgeon: Beverley Fiedler, MD;  Location: [facility];  Service: Gastroenterology;;   MULTIPLE TOOTH EXTRACTIONS  dentures upper and lowers   POLYPECTOMY  09/30/2017   Procedure: POLYPECTOMY;  Surgeon: Beverley Fiedler, MD;  Location: [facility];  Service: Gastroenterology;;   RIGHT/LEFT HEART CATH AND CORONARY ANGIOGRAPHY N/A 02/23/2022   Procedure: RIGHT/LEFT HEART CATH AND CORONARY ANGIOGRAPHY;  Surgeon: Lyn Records, MD;  Location: [facility];  Service: Cardiovascular;  Laterality: N/A;   TONSILLECTOMY      OB History   No obstetric history on file.      Home Medications    Prior to Admission  medications   Medication Sig Start Date End Date Taking? Authorizing Provider  HYDROcodone-acetaminophen (NORCO/VICODIN) 5-325 MG tablet Take 1 tablet by mouth every 6 (six) hours as needed. 11/23/22  Yes Trevor Iha, FNP  acetaminophen (TYLENOL) 500 MG tablet Take 1,500 mg by mouth every 6 (six) hours as needed for moderate pain or headache.     [provider]  aspirin EC 81 MG tablet Take 1 tablet (81 mg total) by mouth daily. 03/07/22   Reymundo Poll, MD  Benzocaine, Topical, (BOIL EASE MAXIMUM STRENGTH) 20 % OINT Apply 1 application  topically daily as needed (On Stomach).    [provider]  carvedilol (COREG) 6.25 MG tablet Take 1 tablet (6.25 mg total) by mouth 2 (two) times daily with a meal. 03/22/22   Reymundo Poll, MD  dapagliflozin propanediol (FARXIGA) 10 MG TABS tablet Take 1 tablet (10 mg total) by mouth daily before breakfast. 05/22/22   Sabharwal, Aditya, DO  Menthol, Topical Analgesic, (BIOFREEZE EX) Apply 1 application  topically daily as needed (back pain).    [provider]  neomycin-bacitracin-polymyxin (NEOSPORIN) [phone] ointment Apply 1 Application topically daily as needed (On stomach).    [provider]  sacubitril-valsartan (ENTRESTO) 24-26 MG Take 1 tablet by mouth 2 (two) times daily. 07/04/22   Reymundo Poll, MD  spironolactone ([name]) 25 MG tablet Take 1 tablet (25 mg total) by mouth daily. 03/22/22   Reymundo Poll, MD  torsemide (DEMADEX) 20 MG tablet TAKE 2 TABLETS(40 MG) BY MOUTH DAILY 11/21/22   Reymundo Poll, MD    Family History Family History  Problem Relation Age of Onset   Kidney disease Mother    Heart disease Mother    Diabetes Mother    Seizures Father    Heart disease Father    Other Father    Breast cancer Neg Hx     Social History Social History   Tobacco Use   Smoking status: Former    Current packs/day: 0.25    Average packs/day: 0.3 packs/day for 6.0 years (1.5 ttl pk-yrs)     Types: Cigarettes   Smokeless tobacco: Never   Tobacco comments:    quit 36 yrs ago  Vaping Use   Vaping status: Never Used  Substance Use Topics   Alcohol use: Yes    Comment: occasionally   Drug use: No     Allergies   Percocet [oxycodone-acetaminophen]   Review of Systems Review of Systems  Musculoskeletal:        Right leg pain x 4 days  All other systems reviewed and are negative.    Physical Exam Triage Vital Signs ED Triage Vitals  Encounter Vitals Group     BP      Systolic BP Percentile      Diastolic BP Percentile      Pulse      Resp      Temp      Temp src  SpO2      Weight      Height      Head Circumference      Peak Flow      Pain Score      Pain Loc      Pain Education      Exclude from Growth Chart    No data found.  Updated Vital Signs BP 118/82 (BP Location: Left Arm)   Pulse 83   Temp 97.9 F (36.6 C) (Oral)   Resp 16   SpO2 98%     Physical Exam Vitals and nursing note reviewed.  Constitutional:      Appearance: Normal appearance. She is normal weight.  HENT:     Head: Normocephalic and atraumatic.     Mouth/Throat:     Mouth: Mucous membranes are moist.     Pharynx: Oropharynx is clear.  Eyes:     Extraocular Movements: Extraocular movements intact.     Conjunctiva/sclera: Conjunctivae normal.     Pupils: Pupils are equal, round, and reactive to light.  Cardiovascular:     Rate and Rhythm: Normal rate and regular rhythm.     Pulses: Normal pulses.     Heart sounds: Normal heart sounds.  Pulmonary:     Effort: Pulmonary effort is normal.     Breath sounds: Normal breath sounds. No wheezing, rhonchi or rales.  Musculoskeletal:        General: Normal range of motion.     Cervical back: Normal range of motion and neck supple.     Comments: Right lower leg (medial aspect): Very TTP over superior gastrocnemius-exam limited due to pain today-please see image below  Skin:    General: Skin is warm and dry.   Neurological:     General: No focal deficit present.     Mental Status: She is alert and oriented to person, place, and time. Mental status is at baseline.  Psychiatric:        Mood and Affect: Mood normal.        Behavior: Behavior normal.      UC Treatments / Results  Labs (all labs ordered are listed, but only abnormal results are displayed) Labs Reviewed - No data to display  EKG   Radiology US Venous Img Lower Unilateral Right (DVT)  Result Date: 11/23/2022 CLINICAL DATA:  Leg swelling EXAM: RIGHT LOWER EXTREMITY VENOUS DOPPLER ULTRASOUND TECHNIQUE: Gray-scale sonography with compression, as well as color and duplex ultrasound, were performed to evaluate the deep venous system(s) from the level of the common femoral vein through the popliteal and proximal calf veins. COMPARISON:  None Available. FINDINGS: VENOUS Normal compressibility of the common femoral, superficial femoral, and popliteal veins. Limited visualization of the posterior tibial vein with nonvisualization of the peroneal vein. Visualized portions of profunda femoral vein and great saphenous vein unremarkable. No filling defects to suggest DVT on grayscale or color Doppler imaging. Doppler waveforms show normal direction of venous flow, normal respiratory plasticity and response to augmentation. Limited views of the contralateral common femoral vein are unremarkable. OTHER Elongated fluid within a visualized muscle body of the posterior right mid calf, presumably the gastrocnemius, measuring 13.6 x 1.5 x 6.2 cm. Limitations: none IMPRESSION: 1. Negative examination for deep venous thrombosis in the right lower extremity. 2. Limited visualization of the posterior tibial vein with nonvisualization of the peroneal vein. 3. Elongated fluid within a visualized muscle body of the posterior right mid calf, presumably the gastrocnemius, measuring 13.6 x 1.5 x 6.2  cm. This presumably reflects sequelae of trauma. Electronically Signed    By: Jearld Lesch M.D.   On: 11/23/2022 15:17    Procedures Procedures (including critical care time)  Medications Ordered in UC Medications - No data to display  Initial Impression / Assessment and Plan / UC Course  I have reviewed the triage vital signs and the nursing notes.  Pertinent labs & imaging results that were available during my care of the patient were reviewed by me and considered in my medical decision making (see chart for details).     MDM: 1.  Right leg pain-Rx'd Norco 5/325 mg tablet: Take 1 tablet every 6 hours as needed for right lower leg pain.  Patient advised of sedative effects.  Patient reports taking this medication numerous times before without adverse reactions.  2.  Localized swelling of right lower leg-right lower leg DVT study revealed above-negative for DVT-patient advised; 3.  Gastrocnemius strain, right, initial encounter-concerning for tear or trauma per radiology above.  Rx'd Norco 5/325 mg tablet: Take 1 tablet every 6 hours as needed for right lower leg (patient reports taking this medication numerous times before without adverse reaction).  Immediate [facility] follow-up recommended contact information provided with AVS and patient prior to discharge. Final Clinical Impressions(s) / UC Diagnoses   Final diagnoses:  Right leg pain  Localized swelling of right lower leg  Gastrocnemius strain, right, initial encounter     Discharge Instructions      Patient advised of DVT study negative for deep vein thrombosis/blood clot but concerning for possible right calf muscle strain or tear.  Advised patient may take Norco daily, as needed for right lower leg pain.  Patient advised of sedative effects of this medication.  Encouraged increase daily water intake to 64 ounces per day while taking this medication.  Advised patient to follow-up with [facility] first thing Monday morning, 11/26/2022.     ED Prescriptions      Medication Sig Dispense Auth. Provider   HYDROcodone-acetaminophen (NORCO/VICODIN) 5-325 MG tablet Take 1 tablet by mouth every 6 (six) hours as needed. 30 tablet Trevor Iha, FNP      I have reviewed the PDMP during this encounter.   Trevor Iha, FNP 11/23/22 [phone]

## 2022-11-26 ENCOUNTER — Encounter (HOSPITAL_COMMUNITY): Admission: RE | Payer: Self-pay | Source: Home / Self Care

## 2022-11-26 ENCOUNTER — Ambulatory Visit (HOSPITAL_COMMUNITY): Admission: RE | Admit: 2022-11-26 | Payer: Medicare Other | Source: Home / Self Care | Admitting: Internal Medicine

## 2022-11-26 SURGERY — ESOPHAGOGASTRODUODENOSCOPY (EGD) WITH PROPOFOL
Anesthesia: Monitor Anesthesia Care

## 2022-11-28 ENCOUNTER — Ambulatory Visit: Payer: Medicare Other | Admitting: Sports Medicine

## 2022-11-28 ENCOUNTER — Other Ambulatory Visit (HOSPITAL_COMMUNITY): Payer: Self-pay

## 2022-11-28 VITALS — BP 94/56 | Ht 69.0 in | Wt 293.0 lb

## 2022-11-28 DIAGNOSIS — S86111A Strain of other muscle(s) and tendon(s) of posterior muscle group at lower leg level, right leg, initial encounter: Secondary | ICD-10-CM | POA: Diagnosis not present

## 2022-11-28 DIAGNOSIS — M79604 Pain in right leg: Secondary | ICD-10-CM | POA: Diagnosis not present

## 2022-11-28 NOTE — Patient Instructions (Addendum)
You have a tear of the medial gastrocnemius muscle. Please continue the tylenol 1000mg  every 8 hours and try diclofenac (voltaren) gel for pain. Ice can be helpful for this as well. Use the compression sleeve which will help with pain, support and healing. Limit activity to what you can tolerably do without too much pain for now. We have referred you to physical therapy to work on rehabbing this calf muscle. Please follow-up with [place] in 4 weeks.

## 2022-11-28 NOTE — Progress Notes (Signed)
PCP: Tina Poll, MD  SUBJECTIVE:   HPI:  Patient is a 67 y.o. female here with chief complaint of right calf pain.  She is accompanied by her daughter who aids in providing history.  They state that a week ago on Saturday (8/17) evening she pulled her calf climbing into bed from a stepstool.  It was not initially too painful but woke her up in the middle night and was swollen and very tender.  Pain persisted throughout the next day and was located on the medial aspect of the posterior calf.  She tried TikTok exercises to flex and extend the ankle without much benefit.  She was evaluated at urgent care on 8/23 and had a DVT ultrasound which was negative for DVT though did show a fluid collection in the medial calf presumably secondary to a muscle injury.  They were advised to follow-up today.  She states pain has slightly improved over the past week with taking the pain medication prescribed in urgent care.  She is now just relying on Tylenol.  She has not been elevating it much and did not find icing it to be too helpful.  Her swelling has improved since her urgent care visit.  ROS:     See HPI  PERTINENT  PMH / PSH FH / / SH:  Past Medical, Surgical, Social, and Family History Reviewed & Updated in the EMR.  Pertinent findings include:  Anxiety CHF Type 2 diabetes Obesity Hypertension Hyperlipidemia  Current Outpatient Medications on File Prior to Visit  Medication Sig Dispense Refill   acetaminophen (TYLENOL) 500 MG tablet Take 1,500 mg by mouth every 6 (six) hours as needed for moderate pain or headache.      aspirin EC 81 MG tablet Take 1 tablet (81 mg total) by mouth daily. 90 tablet 3   Benzocaine, Topical, (BOIL EASE MAXIMUM STRENGTH) 20 % OINT Apply 1 application  topically daily as needed (On Stomach).     carvedilol (COREG) 6.25 MG tablet Take 1 tablet (6.25 mg total) by mouth 2 (two) times daily with a meal. 180 tablet 3   dapagliflozin propanediol (FARXIGA) 10 MG TABS  tablet Take 1 tablet (10 mg total) by mouth daily before breakfast. 30 tablet 11   HYDROcodone-acetaminophen (NORCO/VICODIN) 5-325 MG tablet Take 1 tablet by mouth every 6 (six) hours as needed. 30 tablet 0   Menthol, Topical Analgesic, (BIOFREEZE EX) Apply 1 application  topically daily as needed (back pain).     neomycin-bacitracin-polymyxin (NEOSPORIN) [phone] ointment Apply 1 Application topically daily as needed (On stomach).     sacubitril-valsartan (ENTRESTO) 24-26 MG Take 1 tablet by mouth 2 (two) times daily. 180 tablet 3   spironolactone ([name]) 25 MG tablet Take 1 tablet (25 mg total) by mouth daily. 90 tablet 3   torsemide (DEMADEX) 20 MG tablet TAKE 2 TABLETS(40 MG) BY MOUTH DAILY 180 tablet 0   No current facility-administered medications on file prior to visit.   Allergies  Allergen Reactions   Percocet [Oxycodone-Acetaminophen] Itching    OBJECTIVE:  BP (!) 94/56   Ht 5\' 9"  (1.753 m)   Wt 293 lb (132.9 kg)   BMI 43.27 kg/m   PHYSICAL EXAM:  GEN: Alert and Oriented, NAD, comfortable in exam room RESP: Unlabored respirations, symmetric chest rise PSY: normal Lara, congruent affect   Right leg MSK EXAM: Right knee without effusion, erythema, ecchymosis, or deformity.  No tenderness to palpation along anterior, medial or lateral, or posterior knee. Right lower extremity mildly  edematous, nonpitting.  No ecchymosis or visible deformity in the calf.  She does have significant tenderness to palpation along the medial muscle belly of the gastrocnemius though no pain with palpation of the Achilles tendon or proximal medial gastroc head tendon.  She can plantar and dorsiflex the ankle though strength is 4 out of 5 secondary to pain.   ASSESSMENT & PLAN:  1. Gastrocnemius tear, right, initial encounter History, physical exam, and recent DVT ultrasound most consistent with a tear of the mid substance portion of the medial head of the gastrocnemius muscle.  I reviewed with  the patient and her daughter recommendation to continue relative rest, elevation, ice, and compression. We provided her a body helix calf compression sleeve today. Continue tylenol 1000mg  TID prn for pain control and consider topical diclofenac if insufficient control with tylenol alone. Referral to PT. F/u in 4 weeks to monitor progress, if failing to improve consider MRI.   Tina Salen, MD PGY-4, Sports Medicine Fellow [facility]  Addendum:  Patient seen in the office by fellow.  His history, exam, plan of care were precepted with me.  Tina Blizzard MD Tina [facility]ara

## 2022-11-29 ENCOUNTER — Encounter: Payer: Self-pay | Admitting: Sports Medicine

## 2022-12-04 ENCOUNTER — Ambulatory Visit: Payer: Medicare Other | Admitting: Internal Medicine

## 2022-12-19 DIAGNOSIS — M545 Low back pain, unspecified: Secondary | ICD-10-CM | POA: Diagnosis not present

## 2022-12-26 ENCOUNTER — Ambulatory Visit: Payer: Medicare Other | Admitting: Sports Medicine

## 2022-12-26 ENCOUNTER — Encounter: Payer: Medicare Other | Admitting: Internal Medicine

## 2022-12-26 DIAGNOSIS — M25579 Pain in unspecified ankle and joints of unspecified foot: Secondary | ICD-10-CM | POA: Diagnosis not present

## 2022-12-26 DIAGNOSIS — M545 Low back pain, unspecified: Secondary | ICD-10-CM | POA: Diagnosis not present

## 2022-12-31 DIAGNOSIS — M545 Low back pain, unspecified: Secondary | ICD-10-CM | POA: Diagnosis not present

## 2022-12-31 DIAGNOSIS — M25579 Pain in unspecified ankle and joints of unspecified foot: Secondary | ICD-10-CM | POA: Diagnosis not present

## 2023-01-02 ENCOUNTER — Other Ambulatory Visit: Payer: Self-pay

## 2023-01-02 ENCOUNTER — Encounter: Payer: Self-pay | Admitting: Internal Medicine

## 2023-01-02 ENCOUNTER — Ambulatory Visit: Payer: Medicare Other | Admitting: Internal Medicine

## 2023-01-02 VITALS — BP 114/54 | HR 67 | Temp 97.9°F | Ht 69.0 in | Wt 296.9 lb

## 2023-01-02 DIAGNOSIS — I110 Hypertensive heart disease with heart failure: Secondary | ICD-10-CM | POA: Diagnosis not present

## 2023-01-02 DIAGNOSIS — K7460 Unspecified cirrhosis of liver: Secondary | ICD-10-CM | POA: Diagnosis not present

## 2023-01-02 DIAGNOSIS — D509 Iron deficiency anemia, unspecified: Secondary | ICD-10-CM | POA: Diagnosis not present

## 2023-01-02 DIAGNOSIS — R188 Other ascites: Secondary | ICD-10-CM | POA: Diagnosis not present

## 2023-01-02 DIAGNOSIS — S86119D Strain of other muscle(s) and tendon(s) of posterior muscle group at lower leg level, unspecified leg, subsequent encounter: Secondary | ICD-10-CM | POA: Diagnosis not present

## 2023-01-02 DIAGNOSIS — I5022 Chronic systolic (congestive) heart failure: Secondary | ICD-10-CM

## 2023-01-02 DIAGNOSIS — S86119A Strain of other muscle(s) and tendon(s) of posterior muscle group at lower leg level, unspecified leg, initial encounter: Secondary | ICD-10-CM | POA: Insufficient documentation

## 2023-01-02 DIAGNOSIS — E119 Type 2 diabetes mellitus without complications: Secondary | ICD-10-CM

## 2023-01-02 DIAGNOSIS — B351 Tinea unguium: Secondary | ICD-10-CM

## 2023-01-02 DIAGNOSIS — D500 Iron deficiency anemia secondary to blood loss (chronic): Secondary | ICD-10-CM

## 2023-01-02 DIAGNOSIS — Z7985 Long-term (current) use of injectable non-insulin antidiabetic drugs: Secondary | ICD-10-CM

## 2023-01-02 DIAGNOSIS — I10 Essential (primary) hypertension: Secondary | ICD-10-CM

## 2023-01-02 LAB — GLUCOSE, CAPILLARY: Glucose-Capillary: 139 mg/dL — ABNORMAL HIGH (ref 70–99)

## 2023-01-02 LAB — POCT GLYCOSYLATED HEMOGLOBIN (HGB A1C): Hemoglobin A1C: 7.7 % — AB (ref 4.0–5.6)

## 2023-01-02 MED ORDER — MOUNJARO 2.5 MG/0.5ML ~~LOC~~ SOAJ
2.5000 mg | SUBCUTANEOUS | 2 refills | Status: DC
Start: 2023-01-02 — End: 2023-05-20

## 2023-01-02 NOTE — Assessment & Plan Note (Signed)
Gastrocnemius tear, right, subsequent encounter. Patient presented with right calf pain concerning for DVT; was seen by sports medicine. Ultrasound showed a tear of the mid portion of the medial head of the gastrocnemius muscle. No evidence of DVT. She is improving with conservative management and PT. Has some mild right LE swelling above her ankle that is unilateral but improving per her report. Appreciate sports medicine's management.

## 2023-01-02 NOTE — Assessment & Plan Note (Signed)
Seen by GI recently, had upper endoscopy without evidence of portal HTN. Will request OV notes.

## 2023-01-02 NOTE — Assessment & Plan Note (Signed)
Had recent upper and lower endoscopy with GI for work up of her iron deficiency anemia and newly diagnosed cirrhosis. EGD showed two oozing cratered gastric ulcers one with adherent clot in the gastric body. Lesions were clipped and hemostasis was achieved. There was no evidence of varices or portal HTN. She is due for repeat endoscopy with GI, I have asked her to call and schedule this. Follow up repeat CBC.

## 2023-01-02 NOTE — Assessment & Plan Note (Signed)
Chronic and well controlled on entresto, spironolactone, coreg, and torsemide for her HFrEF. Well compensated today. Checking CMP.

## 2023-01-02 NOTE — Progress Notes (Signed)
Subjective:   Patient ID: [place] female   DOB: 07/08/1955 67 y.o.   MRN: [id number]  HPI: Tina Lara is a 67 y.o. female with past medical history outlined below here for follow up of diabetes. For further details of today's visit, please refer to the assessment and plan below.   Past Medical History:  Diagnosis Date   Anxiety    Cataract    NS OD   Chest pain 08/14/2016   Chronic systolic heart failure (HCC)    Complication of anesthesia    Per patient "needs a lot of anesthesia w/surgery" ,I had toruble with a spinal block not taking "   Diabetes mellitus without complication (HCC)    Type 2   Diabetic retinopathy (HCC)    BDR   Full dentures    GERD (gastroesophageal reflux disease)    Heart failure (HCC)    History of surgery on arm    right arm broken   Hyperlipidemia    Hypertension    never diagnosed by physician per pt   Macular degeneration    Obesity    Current Outpatient Medications  Medication Sig Dispense Refill   tirzepatide (MOUNJARO) 2.5 MG/0.5ML Pen Inject 2.5 mg into the skin once a week. 2 mL 2   acetaminophen (TYLENOL) 500 MG tablet Take 1,500 mg by mouth every 6 (six) hours as needed for moderate pain or headache.      aspirin EC 81 MG tablet Take 1 tablet (81 mg total) by mouth daily. 90 tablet 3   Benzocaine, Topical, (BOIL EASE MAXIMUM STRENGTH) 20 % OINT Apply 1 application  topically daily as needed (On Stomach).     carvedilol (COREG) 6.25 MG tablet Take 1 tablet (6.25 mg total) by mouth 2 (two) times daily with a meal. 180 tablet 3   dapagliflozin propanediol (FARXIGA) 10 MG TABS tablet Take 1 tablet (10 mg total) by mouth daily before breakfast. 30 tablet 11   HYDROcodone-acetaminophen (NORCO/VICODIN) 5-325 MG tablet Take 1 tablet by mouth every 6 (six) hours as needed. 30 tablet 0   Menthol, Topical Analgesic, (BIOFREEZE EX) Apply 1 application  topically daily as needed (back pain).     neomycin-bacitracin-polymyxin  (NEOSPORIN) [phone] ointment Apply 1 Application topically daily as needed (On stomach).     sacubitril-valsartan (ENTRESTO) 24-26 MG Take 1 tablet by mouth 2 (two) times daily. 180 tablet 3   spironolactone ([name]) 25 MG tablet Take 1 tablet (25 mg total) by mouth daily. 90 tablet 3   torsemide (DEMADEX) 20 MG tablet TAKE 2 TABLETS(40 MG) BY MOUTH DAILY 180 tablet 0   No current facility-administered medications for this visit.   Family History  Problem Relation Age of Onset   Kidney disease Mother    Heart disease Mother    Diabetes Mother    Seizures Father    Heart disease Father    Other Father    Breast cancer Neg Hx    Social History   Socioeconomic History   Marital status: Divorced    Spouse name: Not on file   Number of children: 1   Years of education: Not on file   Highest education level: High school graduate  Occupational History   Occupation: disability  Tobacco Use   Smoking status: Former    Current packs/day: 0.25    Average packs/day: 0.3 packs/day for 6.0 years (1.5 ttl pk-yrs)    Types: Cigarettes   Smokeless tobacco: Never   Tobacco comments:  quit 36 yrs ago  Vaping Use   Vaping status: Never Used  Substance and Sexual Activity   Alcohol use: Not Currently    Comment: occasionally   Drug use: No   Sexual activity: Not Currently    Birth control/protection: None  Other Topics Concern   Not on file  Social History Narrative   Not on file   Social Determinants of Health   Financial Resource Strain: High Risk (02/26/2022)   Overall Financial Resource Strain (CARDIA)    Difficulty of Paying Living Expenses: Very hard  Food Insecurity: No Food Insecurity (02/21/2022)   Hunger Vital Sign    Worried About Running Out of Food in the Last Year: Never true    Ran Out of Food in the Last Year: Never true  Recent Concern: Food Insecurity - Food Insecurity Present (02/14/2022)   Hunger Vital Sign    Worried About Running Out of Food in the  Last Year: Sometimes true    Ran Out of Food in the Last Year: Sometimes true  Transportation Needs: No Transportation Needs (03/01/2022)   PRAPARE - Administrator, Civil Service (Medical): No    Lack of Transportation (Non-Medical): No  Physical Activity: Inactive (02/14/2022)   Exercise Vital Sign    Days of Exercise per Week: 0 days    Minutes of Exercise per Session: 0 min  Stress: No Stress Concern Present (02/14/2022)   [employer] of Occupational Health - Occupational Stress Questionnaire    Feeling of Stress : Not at all  Social Connections: Moderately Isolated (02/14/2022)   Social Connection and Isolation Panel [NHANES]    Frequency of Communication with Friends and Family: Twice a week    Frequency of Social Gatherings with Friends and Family: Once a week    Attends Religious Services: More than 4 times per year    Active Member of [employer] or Organizations: No    Attends Banker Meetings: Never    Marital Status: Divorced     Objective:  Physical Exam:  Vitals:   01/02/23 0904  BP: (!) 114/54  Pulse: 67  Temp: 97.9 F (36.6 C)  TempSrc: Oral  SpO2: 100%  Weight: 296 lb 14.4 oz (134.7 kg)  Height: 5\' 9"  (1.753 m)    Constitutional: NAD, well appearing  Cardiovascular: RRR, no m/r/g Pulmonary/Chest: Clear bilaterally, normal effort Extremities: Bilateral toes with thickened dystrophic nails; sensation to pinprick intact throughout.    Assessment & Plan:   Onychomycosis Ambulatory referral to podiatry.   Iron deficiency anemia Had recent upper and lower endoscopy with GI for work up of her iron deficiency anemia and newly diagnosed cirrhosis. EGD showed two oozing cratered gastric ulcers one with adherent clot in the gastric body. Lesions were clipped and hemostasis was achieved. There was no evidence of varices or portal HTN. She is due for repeat endoscopy with GI, I have asked her to call and schedule this. Follow up repeat  CBC.   HTN (hypertension) Chronic and well controlled on entresto, spironolactone, coreg, and torsemide for her HFrEF. Well compensated today. Checking CMP.   Diabetes mellitus without complication (HCC) Hgb A1c elevated at 7.7. She would like to try switching from ozempic to [facility]. New Rx sent; discussed uptitrating to glycemic goal of Hgb A1C < 7 and for optimal weight loss benefit. She is also taking farxiga. Will see her back in 3 months and plan to increase dose of injectable then for slow up titration.   Cirrhosis (  HCC) Seen by GI recently, had upper endoscopy without evidence of portal HTN. Will request OV notes.   Chronic HFrEF (heart failure with reduced ejection fraction) (HCC) Patient with NICM 2/2 to high output HF; well compensated on today's exam and feels better overall.  Being managed by cardiology. On entresto, spironolactone, coreg, farxiga, and torsemide. Checking CMP today.   Gastrocnemius tear  Gastrocnemius tear, right, subsequent encounter. Patient presented with right calf pain concerning for DVT; was seen by sports medicine. Ultrasound showed a tear of the mid portion of the medial head of the gastrocnemius muscle. No evidence of DVT. She is improving with conservative management and PT. Has some mild right LE swelling above her ankle that is unilateral but improving per her report. Appreciate sports medicine's management.

## 2023-01-02 NOTE — Assessment & Plan Note (Signed)
Patient with NICM 2/2 to high output HF; well compensated on today's exam and feels better overall.  Being managed by cardiology. On entresto, spironolactone, coreg, farxiga, and torsemide. Checking CMP today.

## 2023-01-02 NOTE — Assessment & Plan Note (Signed)
Hgb A1c elevated at 7.7. She would like to try switching from ozempic to [facility]. New Rx sent; discussed uptitrating to glycemic goal of Hgb A1C < 7 and for optimal weight loss benefit. She is also taking farxiga. Will see her back in 3 months and plan to increase dose of injectable then for slow up titration.

## 2023-01-02 NOTE — Patient Instructions (Addendum)
Ms. Canino,  It was a pleasure to see you today. Please follow up with me again in 3 month.  If you have any questions or concerns, call our clinic at [phone] or after hours call [phone] and ask for the internal medicine resident on call.   Thank you!  Dr. Reece Agar

## 2023-01-02 NOTE — Assessment & Plan Note (Signed)
Ambulatory referral to podiatry.

## 2023-01-03 LAB — CBC WITH DIFFERENTIAL/PLATELET
Basophils Absolute: 0 10*3/uL (ref 0.0–0.2)
Basos: 1 %
EOS (ABSOLUTE): 0.1 10*3/uL (ref 0.0–0.4)
Eos: 3 %
Hematocrit: 43.5 % (ref 34.0–46.6)
Hemoglobin: 14 g/dL (ref 11.1–15.9)
Immature Grans (Abs): 0 10*3/uL (ref 0.0–0.1)
Immature Granulocytes: 0 %
Lymphocytes Absolute: 0.8 10*3/uL (ref 0.7–3.1)
Lymphs: 20 %
MCH: 28.9 pg (ref 26.6–33.0)
MCHC: 32.2 g/dL (ref 31.5–35.7)
MCV: 90 fL (ref 79–97)
Monocytes Absolute: 0.5 10*3/uL (ref 0.1–0.9)
Monocytes: 13 %
Neutrophils Absolute: 2.4 10*3/uL (ref 1.4–7.0)
Neutrophils: 63 %
Platelets: 195 10*3/uL (ref 150–450)
RBC: 4.84 x10E6/uL (ref 3.77–5.28)
RDW: 12 % (ref 11.7–15.4)
WBC: 3.8 10*3/uL (ref 3.4–10.8)

## 2023-01-03 LAB — CMP14 + ANION GAP
ALT: 15 [IU]/L (ref 0–32)
AST: 21 [IU]/L (ref 0–40)
Albumin: 4.2 g/dL (ref 3.9–4.9)
Alkaline Phosphatase: 103 [IU]/L (ref 44–121)
Anion Gap: 19 mmol/L — ABNORMAL HIGH (ref 10.0–18.0)
BUN/Creatinine Ratio: 20 (ref 12–28)
BUN: 35 mg/dL — ABNORMAL HIGH (ref 8–27)
Bilirubin Total: 0.3 mg/dL (ref 0.0–1.2)
CO2: 26 mmol/L (ref 20–29)
Calcium: 9.6 mg/dL (ref 8.7–10.3)
Chloride: 95 mmol/L — ABNORMAL LOW (ref 96–106)
Creatinine, Ser: 1.71 mg/dL — ABNORMAL HIGH (ref 0.57–1.00)
Globulin, Total: 4.2 g/dL (ref 1.5–4.5)
Glucose: 146 mg/dL — ABNORMAL HIGH (ref 70–99)
Potassium: 4.4 mmol/L (ref 3.5–5.2)
Sodium: 140 mmol/L (ref 134–144)
Total Protein: 8.4 g/dL (ref 6.0–8.5)
eGFR: 33 mL/min/{1.73_m2} — ABNORMAL LOW (ref >59)

## 2023-01-29 ENCOUNTER — Ambulatory Visit (INDEPENDENT_AMBULATORY_CARE_PROVIDER_SITE_OTHER): Payer: Medicare Other | Admitting: Podiatry

## 2023-01-29 ENCOUNTER — Encounter: Payer: Self-pay | Admitting: Podiatry

## 2023-01-29 DIAGNOSIS — B351 Tinea unguium: Secondary | ICD-10-CM | POA: Diagnosis not present

## 2023-01-29 DIAGNOSIS — M79675 Pain in left toe(s): Secondary | ICD-10-CM | POA: Insufficient documentation

## 2023-01-29 DIAGNOSIS — M79674 Pain in right toe(s): Secondary | ICD-10-CM

## 2023-01-29 NOTE — Progress Notes (Signed)
This patient presents to the office with chief complaint of long thick nails and diabetic feet.  This patient  says there  is  no pain only numbness in her feet.  This patient says there are long thick painful nails.  These nails are painful walking and wearing shoes.  Patient has no history of infection or drainage from both feet.  Patient is unable to  self treat his own nails . This patient presents  to the office today for treatment of the  long nails and a foot evaluation due to history of  diabetes.  General Appearance  Alert, conversant and in no acute stress.  Vascular  Dorsalis pedis and posterior tibial  pulses are  not palpable  bilaterally.  Capillary return is within normal limits  bilaterally. Temperature is within normal limits  bilaterally.  Neurologic  Senn-Weinstein monofilament wire test within normal limits  bilaterally. Muscle power within normal limits bilaterally.  Nails Thick disfigured discolored nails with subungual debris  from hallux to fifth toes bilaterally. No evidence of bacterial infection or drainage bilaterally.  Orthopedic  No limitations of motion of motion feet .  No crepitus or effusions noted.  HAV  B/L.  Skin  normotropic skin with no porokeratosis noted bilaterally.  No signs of infections or ulcers noted.   Asymptomatic callus sub 4 left foot.  Onychomycosis  Diabetes with no foot complications  IE  Debride nails x 10.  A diabetic foot exam was performed and there is no evidence of  neurologic pathology.  No pulses were found but her feet look normal. Consider in future if needed.  RTC 3 months.   Helane Gunther DPM

## 2023-02-04 ENCOUNTER — Telehealth: Payer: Self-pay

## 2023-02-04 NOTE — Telephone Encounter (Signed)
Left message for patient to return my call.

## 2023-02-05 NOTE — Telephone Encounter (Signed)
Left message for patient to return my call to reschedule EGD.

## 2023-02-06 ENCOUNTER — Other Ambulatory Visit: Payer: Self-pay

## 2023-02-06 DIAGNOSIS — K259 Gastric ulcer, unspecified as acute or chronic, without hemorrhage or perforation: Secondary | ICD-10-CM

## 2023-02-06 NOTE — Telephone Encounter (Signed)
Left message for patient to return my call. Will send a Mychart message informing patient to contact office to schedule Upper Endoscopy.

## 2023-02-06 NOTE — Telephone Encounter (Signed)
Patient returned my call. Patient scheduled EGD at [facility] hospital on 05/23/23 at 7:30am, scheduled with Kindle, Case #[id number]. Informed patient I will put instructions on MyChart for her to review and to contact our office if she has any questions. Patient verbalized understanding.

## 2023-02-12 ENCOUNTER — Other Ambulatory Visit: Payer: Self-pay | Admitting: Internal Medicine

## 2023-03-14 ENCOUNTER — Other Ambulatory Visit: Payer: Self-pay | Admitting: Internal Medicine

## 2023-03-14 DIAGNOSIS — I5043 Acute on chronic combined systolic (congestive) and diastolic (congestive) heart failure: Secondary | ICD-10-CM

## 2023-03-15 NOTE — Telephone Encounter (Signed)
Please have patient schedule follow up with me after the holidays. Thanks.

## 2023-04-02 ENCOUNTER — Other Ambulatory Visit (HOSPITAL_COMMUNITY): Payer: Self-pay

## 2023-04-02 ENCOUNTER — Ambulatory Visit
Admission: RE | Admit: 2023-04-02 | Discharge: 2023-04-02 | Disposition: A | Discharge status: Home / Self Care | Payer: Medicare Other | Source: Ambulatory Visit | Attending: Family Medicine | Admitting: Family Medicine

## 2023-04-02 VITALS — BP 147/84 | HR 74 | Temp 97.6°F | Resp 16

## 2023-04-02 DIAGNOSIS — M25551 Pain in right hip: Secondary | ICD-10-CM

## 2023-04-02 DIAGNOSIS — R519 Headache, unspecified: Secondary | ICD-10-CM

## 2023-04-02 DIAGNOSIS — M542 Cervicalgia: Secondary | ICD-10-CM | POA: Diagnosis not present

## 2023-04-02 DIAGNOSIS — M25552 Pain in left hip: Secondary | ICD-10-CM

## 2023-04-02 MED ORDER — TIZANIDINE HCL 4 MG PO TABS: 4.0000 mg | ORAL_TABLET | Freq: Four times a day (QID) | ORAL | 0 refills | Status: DC | PRN

## 2023-04-02 MED ORDER — TIZANIDINE HCL 4 MG PO TABS
4.0000 mg | ORAL_TABLET | Freq: Four times a day (QID) | ORAL | 0 refills | Status: DC | PRN
  Filled 2023-04-02: qty 21, 3d supply, fill #0

## 2023-04-02 MED ORDER — HYDROCODONE-ACETAMINOPHEN 5-325 MG PO TABS: 1.0000 | ORAL_TABLET | Freq: Four times a day (QID) | ORAL | 0 refills | Status: DC | PRN

## 2023-04-02 MED ORDER — HYDROCODONE-ACETAMINOPHEN 5-325 MG PO TABS
1.0000 | ORAL_TABLET | Freq: Four times a day (QID) | ORAL | 0 refills | Status: DC | PRN
  Filled 2023-04-02: qty 15, 2d supply, fill #0
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
## 2023-04-02 NOTE — Discharge Instructions (Signed)
 May take Tylenol  for pain.  If you take 3 of the 500 mg Tylenol  (1500 mg) you could only do this 2 times a day May take Norco for severe pain.  Do not drive on hydrocodone  Take tizanidine  as needed as muscle relaxer.  This is useful to take at bedtime Continue with ice or heat to painful muscles See your doctor if not improving by next week
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
## 2023-04-17 ENCOUNTER — Ambulatory Visit: Payer: Medicare Other | Admitting: Student

## 2023-04-17 VITALS — BP 145/78 | HR 66 | Temp 98.1°F | Ht 69.0 in | Wt 297.8 lb

## 2023-04-17 DIAGNOSIS — Z7985 Long-term (current) use of injectable non-insulin antidiabetic drugs: Secondary | ICD-10-CM | POA: Diagnosis not present

## 2023-04-17 DIAGNOSIS — I10 Essential (primary) hypertension: Secondary | ICD-10-CM

## 2023-04-17 DIAGNOSIS — S76012A Strain of muscle, fascia and tendon of left hip, initial encounter: Secondary | ICD-10-CM | POA: Diagnosis not present

## 2023-04-17 DIAGNOSIS — E119 Type 2 diabetes mellitus without complications: Secondary | ICD-10-CM | POA: Diagnosis not present

## 2023-04-17 DIAGNOSIS — Z7984 Long term (current) use of oral hypoglycemic drugs: Secondary | ICD-10-CM

## 2023-04-17 LAB — POCT GLYCOSYLATED HEMOGLOBIN (HGB A1C): Hemoglobin A1C: 7.1 % — AB (ref 4.0–5.6)

## 2023-04-17 LAB — GLUCOSE, CAPILLARY: Glucose-Capillary: 121 mg/dL — ABNORMAL HIGH (ref 70–99)

## 2023-04-17 MED ORDER — DICLOFENAC SODIUM 1 % EX GEL: 4.0000 g | Freq: Four times a day (QID) | CUTANEOUS | 0 refills | Status: DC

## 2023-04-17 NOTE — Assessment & Plan Note (Signed)
 Last A1c elevated at 7.7 in October.  She is currently taking Mounjaro  and Farxiga , may increase her dose of Mounjaro  depending on what her new A1c is.
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
## 2023-04-17 NOTE — Patient Instructions (Signed)
 Thank you so much for coming to the clinic today!   For your hip pain, I have placed a referral for physical therapy and have also sent in a prescription for a gel called voltaren . Please use it four times daily.   For your diabetes, we are rechecking you A1c and can decide if we need to increase your Mounjaro .   If you have any questions please feel free to the call the clinic at anytime at [phone]. It was a pleasure seeing you!  Best, Dr. Asti Mackley

## 2023-04-17 NOTE — Assessment & Plan Note (Signed)
 Blood pressure elevated today, however patient did not take her medications.  Will reevaluate at next visit and encourage patient to take her medications before coming to the clinic.

## 2023-04-19 NOTE — Addendum Note (Signed)
Addended by: Olegario Messier on: 04/19/2023 11:12 AM   Modules accepted: Level of Service

## 2023-04-24 NOTE — Addendum Note (Signed)
Addended by: Dickie La on: 04/24/2023 09:34 AM   Modules accepted: Level of Service

## 2023-04-24 NOTE — Progress Notes (Signed)
 Internal Medicine Clinic Attending  Case discussed with the resident at the time of the visit.  We reviewed the resident's history and exam and pertinent patient test results.  I agree with the assessment, diagnosis, and plan of care documented in the resident's note.

## 2023-05-01 ENCOUNTER — Ambulatory Visit (INDEPENDENT_AMBULATORY_CARE_PROVIDER_SITE_OTHER): Payer: Medicare Other | Admitting: Podiatry

## 2023-05-01 ENCOUNTER — Encounter: Payer: Self-pay | Admitting: Podiatry

## 2023-05-01 DIAGNOSIS — M79675 Pain in left toe(s): Secondary | ICD-10-CM

## 2023-05-01 DIAGNOSIS — E119 Type 2 diabetes mellitus without complications: Secondary | ICD-10-CM

## 2023-05-01 DIAGNOSIS — M79674 Pain in right toe(s): Secondary | ICD-10-CM

## 2023-05-01 DIAGNOSIS — B351 Tinea unguium: Secondary | ICD-10-CM

## 2023-05-01 NOTE — Progress Notes (Signed)
Patient was unaware she was scheduled for nail care.  She thought it was just a check up. She wore nail polish on her nails.  She decided no treatment today.   >Luqman Perrelli Stacie Acres DPM

## 2023-05-16 ENCOUNTER — Encounter (HOSPITAL_COMMUNITY): Payer: Self-pay | Admitting: Internal Medicine

## 2023-05-16 NOTE — Progress Notes (Signed)
Attempted to obtain medical history for pre op call via telephone, unable to reach at this time. HIPAA compliant voicemail message left requesting return call to pre surgical testing department.

## 2023-05-20 ENCOUNTER — Telehealth: Payer: Self-pay | Admitting: Internal Medicine

## 2023-05-20 ENCOUNTER — Other Ambulatory Visit: Payer: Self-pay

## 2023-05-20 ENCOUNTER — Other Ambulatory Visit: Payer: Self-pay | Admitting: Internal Medicine

## 2023-05-20 DIAGNOSIS — E119 Type 2 diabetes mellitus without complications: Secondary | ICD-10-CM

## 2023-05-20 DIAGNOSIS — K259 Gastric ulcer, unspecified as acute or chronic, without hemorrhage or perforation: Secondary | ICD-10-CM

## 2023-05-20 DIAGNOSIS — D509 Iron deficiency anemia, unspecified: Secondary | ICD-10-CM

## 2023-05-20 DIAGNOSIS — I5043 Acute on chronic combined systolic (congestive) and diastolic (congestive) heart failure: Secondary | ICD-10-CM

## 2023-05-20 NOTE — Telephone Encounter (Signed)
 Patient called and stated that she is needing to cancel her procedure at the hospital due to the upcoming weather. Patient stated that she will not be able to have any one to take her there. Patient is requesting a call back. Please advise.

## 2023-05-20 NOTE — Telephone Encounter (Signed)
 Appt cancelled and rescheduled at [facility] for 5/12 at 8:00am, pt to arrive there at 6:30am. Pt aware of new appt. [phone]. Pt aware of new appt.

## 2023-05-22 ENCOUNTER — Other Ambulatory Visit (HOSPITAL_COMMUNITY): Payer: Self-pay | Admitting: Cardiology

## 2023-05-22 ENCOUNTER — Other Ambulatory Visit: Payer: Self-pay | Admitting: Internal Medicine

## 2023-05-22 DIAGNOSIS — I5043 Acute on chronic combined systolic (congestive) and diastolic (congestive) heart failure: Secondary | ICD-10-CM
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
## 2023-06-06 ENCOUNTER — Encounter (HOSPITAL_COMMUNITY): Payer: Self-pay

## 2023-06-06 ENCOUNTER — Ambulatory Visit (HOSPITAL_COMMUNITY)
Admission: RE | Admit: 2023-06-06 | Discharge: 2023-06-06 | Disposition: A | Discharge status: Home / Self Care | Source: Ambulatory Visit | Attending: Cardiology | Admitting: Cardiology

## 2023-06-06 VITALS — BP 104/64 | HR 71 | Wt 298.4 lb

## 2023-06-06 DIAGNOSIS — Z7985 Long-term (current) use of injectable non-insulin antidiabetic drugs: Secondary | ICD-10-CM | POA: Insufficient documentation

## 2023-06-06 DIAGNOSIS — D890 Polyclonal hypergammaglobulinemia: Secondary | ICD-10-CM | POA: Diagnosis not present

## 2023-06-06 DIAGNOSIS — I428 Other cardiomyopathies: Secondary | ICD-10-CM | POA: Insufficient documentation

## 2023-06-06 DIAGNOSIS — E1122 Type 2 diabetes mellitus with diabetic chronic kidney disease: Secondary | ICD-10-CM | POA: Diagnosis not present

## 2023-06-06 DIAGNOSIS — M25511 Pain in right shoulder: Secondary | ICD-10-CM

## 2023-06-06 DIAGNOSIS — Z6841 Body Mass Index (BMI) 40.0 and over, adult: Secondary | ICD-10-CM

## 2023-06-06 DIAGNOSIS — N1832 Chronic kidney disease, stage 3b: Secondary | ICD-10-CM | POA: Diagnosis not present

## 2023-06-06 DIAGNOSIS — R5383 Other fatigue: Secondary | ICD-10-CM | POA: Insufficient documentation

## 2023-06-06 DIAGNOSIS — I130 Hypertensive heart and chronic kidney disease with heart failure and stage 1 through stage 4 chronic kidney disease, or unspecified chronic kidney disease: Secondary | ICD-10-CM | POA: Diagnosis not present

## 2023-06-06 DIAGNOSIS — E669 Obesity, unspecified: Secondary | ICD-10-CM | POA: Diagnosis not present

## 2023-06-06 DIAGNOSIS — I5043 Acute on chronic combined systolic (congestive) and diastolic (congestive) heart failure: Secondary | ICD-10-CM

## 2023-06-06 DIAGNOSIS — I5083 High output heart failure: Secondary | ICD-10-CM | POA: Diagnosis present

## 2023-06-06 DIAGNOSIS — I2510 Atherosclerotic heart disease of native coronary artery without angina pectoris: Secondary | ICD-10-CM | POA: Diagnosis not present

## 2023-06-06 DIAGNOSIS — E66813 Obesity, class 3: Secondary | ICD-10-CM

## 2023-06-06 DIAGNOSIS — Z87891 Personal history of nicotine dependence: Secondary | ICD-10-CM | POA: Insufficient documentation

## 2023-06-06 DIAGNOSIS — Z7984 Long term (current) use of oral hypoglycemic drugs: Secondary | ICD-10-CM | POA: Insufficient documentation

## 2023-06-06 DIAGNOSIS — I5032 Chronic diastolic (congestive) heart failure: Secondary | ICD-10-CM | POA: Diagnosis not present

## 2023-06-06 LAB — BASIC METABOLIC PANEL
Anion gap: 11 (ref 5–15)
BUN: 32 mg/dL — ABNORMAL HIGH (ref 8–23)
CO2: 27 mmol/L (ref 22–32)
Calcium: 9 mg/dL (ref 8.9–10.3)
Chloride: 102 mmol/L (ref 98–111)
Creatinine, Ser: 1.86 mg/dL — ABNORMAL HIGH (ref 0.44–1.00)
GFR, Estimated: 30 mL/min — ABNORMAL LOW (ref >60)
Glucose, Bld: 122 mg/dL — ABNORMAL HIGH (ref 70–99)
Potassium: 4.1 mmol/L (ref 3.5–5.1)
Sodium: 140 mmol/L (ref 135–145)

## 2023-06-06 LAB — BRAIN NATRIURETIC PEPTIDE: B Natriuretic Peptide: 40.1 pg/mL (ref 0.0–100.0)

## 2023-06-06 MED ORDER — TORSEMIDE 20 MG PO TABS
ORAL_TABLET | ORAL | 3 refills | Status: AC
Start: 2023-06-06 — End: ?

## 2023-06-06 NOTE — Patient Instructions (Signed)
 CHANGE Torsemide to 40 mg in the morning and 20 mg in the evening.  Labs done today, your results will be available in MyChart, we will contact you for abnormal readings.  Your physician has requested that you have an echocardiogram. Echocardiography is a painless test that uses sound waves to create images of your heart. It provides your doctor with information about the size and shape of your heart and how well your heart's chambers and valves are working. This procedure takes approximately one hour. There are no restrictions for this procedure. Please do NOT wear cologne, perfume, aftershave, or lotions (deodorant is allowed). Please arrive 15 minutes prior to your appointment time.  Please note: We ask at that you not bring children with you during ultrasound (echo/ vascular) testing. Due to room size and safety concerns, children are not allowed in the ultrasound rooms during exams. Our front office staff cannot provide observation of children in our lobby area while testing is being conducted. An adult accompanying a patient to their appointment will only be allowed in the ultrasound room at the discretion of the ultrasound technician under special circumstances. We apologize for any inconvenience.  Your physician recommends that you schedule a follow-up appointment in: 2 months ( May) ** PLEASE CALL THE OFFICE IN APRIL TO ARRANGE YOUR FOLLOW UP APPOINTMENT.  If you have any questions or concerns before your next appointment please send [place] a message through [place] or call our office at [phone].    TO LEAVE A MESSAGE FOR THE NURSE SELECT OPTION 2, PLEASE LEAVE A MESSAGE INCLUDING: YOUR NAME DATE OF BIRTH CALL BACK NUMBER REASON FOR CALL**this is important as we prioritize the call backs  YOU WILL RECEIVE A CALL BACK THE SAME DAY AS LONG AS YOU CALL BEFORE 4:00 PM  At the [facility], you and your health needs are our priority. As part of our continuing mission to  provide you with exceptional heart care, we have created designated Provider Care Teams. These Care Teams include your primary Cardiologist (physician) and Advanced Practice Providers (APPs- Physician Assistants and Nurse Practitioners) who all work together to provide you with the care you need, when you need it.   You may see any of the following providers on your designated Care Team at your next follow up: Dr Arvilla Meres Dr Marca Ancona Dr. Dorthula Nettles Dr. Clearnce Hasten Amy Filbert Schilder, NP Robbie Lis, [place] [facility] [place], [place] Brynda Peon, NP [place] Lee, NP Clarisa Kindred, NP Karle Plumber, PharmD Enos Fling, PharmD   Please be sure to bring in all your medications bottles to every appointment.    Thank you for choosing [facility]

## 2023-06-17 DIAGNOSIS — M542 Cervicalgia: Secondary | ICD-10-CM | POA: Diagnosis not present

## 2023-06-18 ENCOUNTER — Telehealth: Payer: Self-pay | Admitting: *Deleted

## 2023-06-18 NOTE — Telephone Encounter (Signed)
 Copied from CRM [id number]. Topic: Clinical - Medication Question >> Jun 18, 2023  2:44 PM Prudencio Pair wrote: Reason for CRM: Dennie Bible, an RN, with [employer], calling to speak with Dr. Jule Ser nurse. She states that she just wants to know if patient is currently on a statin or allergic to one? Dennie Bible stated her call back number is [phone], which is a secure line to her desk & stated you can also leave a message with that information.

## 2023-06-18 NOTE — Telephone Encounter (Signed)
 Call return to [facility] with [facility]. Informed pt is not on a statin medication.

## 2023-06-28 ENCOUNTER — Other Ambulatory Visit: Payer: Self-pay

## 2023-06-28 DIAGNOSIS — E119 Type 2 diabetes mellitus without complications: Secondary | ICD-10-CM

## 2023-06-28 DIAGNOSIS — I5043 Acute on chronic combined systolic (congestive) and diastolic (congestive) heart failure: Secondary | ICD-10-CM

## 2023-06-28 MED ORDER — SPIRONOLACTONE 25 MG PO TABS: 25.0000 mg | ORAL_TABLET | Freq: Every day | ORAL | 1 refills | Status: DC

## 2023-06-28 MED ORDER — MOUNJARO 2.5 MG/0.5ML ~~LOC~~ SOAJ: 2.5000 mg | SUBCUTANEOUS | 1 refills | Status: DC

## 2023-06-28 MED ORDER — DAPAGLIFLOZIN PROPANEDIOL 10 MG PO TABS: 10.0000 mg | ORAL_TABLET | Freq: Every day | ORAL | 0 refills | Status: DC

## 2023-06-28 MED ORDER — MOUNJARO 2.5 MG/0.5ML ~~LOC~~ SOAJ: 2.5000 mg | SUBCUTANEOUS | 0 refills | Status: DC

## 2023-06-28 MED ORDER — SACUBITRIL-VALSARTAN 24-26 MG PO TABS: 1.0000 | ORAL_TABLET | Freq: Two times a day (BID) | ORAL | 0 refills | Status: DC

## 2023-06-28 MED ORDER — SACUBITRIL-VALSARTAN 24-26 MG PO TABS: 1.0000 | ORAL_TABLET | Freq: Two times a day (BID) | ORAL | 3 refills | Status: DC

## 2023-06-28 MED ORDER — PANTOPRAZOLE SODIUM 40 MG PO TBEC: 40.0000 mg | DELAYED_RELEASE_TABLET | Freq: Every day | ORAL | 0 refills | Status: DC

## 2023-06-28 MED ORDER — PANTOPRAZOLE SODIUM 40 MG PO TBEC
40.0000 mg | DELAYED_RELEASE_TABLET | Freq: Every day | ORAL | 0 refills | Status: DC
Start: 2023-06-28 — End: 2023-06-28

## 2023-06-28 NOTE — Telephone Encounter (Signed)
 Rxs filled as "Print" ; I re-send rxs but still stating "Print". Dr Mikey Bussing can you re-send rxs again.

## 2023-06-28 NOTE — Addendum Note (Signed)
 Addended by: Hassan Buckler on: 06/28/2023 12:18 PM   Modules accepted: Orders

## 2023-06-28 NOTE — Telephone Encounter (Signed)
 Copied from CRM [id number]. Topic: Clinical - Medication Refill >> Jun 28, 2023  9:52 AM Maryann Alar wrote: Most Recent Primary Care Visit:  Provider: Thomasene Ripple, SAAD  Department: [facility]  Visit Type: OPEN ESTABLISHED  Date: 04/17/2023  Medication: spironolactone ([name]) 25 MG tablet  sacubitril-valsartan (ENTRESTO) 24-26 MG    dapagliflozin propanediol (FARXIGA) 10 MG TABS tablet pantoprazole (PROTONIX) 40 MG tablet      MOUNJARO 2.5 MG/0.5ML Pen   Has the patient contacted their pharmacy? Yes (Agent: If no, request that the patient contact the pharmacy for the refill. If patient does not wish to contact the pharmacy document the reason why and proceed with request.) (Agent: If yes, when and what did the pharmacy advise?)  Is this the correct pharmacy for this prescription? Yes If no, delete pharmacy and type the correct one.  This is the patient's preferred pharmacy:    [facility] [address] [place] - [address] [place] [address] Phone: [phone] Fax: [phone]   Has the prescription been filled recently? No  Is the patient out of the medication? No  Has the patient been seen for an appointment in the last year OR does the patient have an upcoming appointment? Yes  Can we respond through MyChart? Yes  Agent: Please be advised that Rx refills may take up to 3 business days. We ask that you follow-up with your pharmacy.

## 2023-07-01 ENCOUNTER — Other Ambulatory Visit: Payer: Self-pay | Admitting: Internal Medicine

## 2023-07-01 ENCOUNTER — Other Ambulatory Visit (HOSPITAL_COMMUNITY): Payer: Self-pay | Admitting: Cardiology

## 2023-07-01 ENCOUNTER — Other Ambulatory Visit: Payer: Self-pay | Admitting: *Deleted

## 2023-07-01 MED ORDER — SACUBITRIL-VALSARTAN 24-26 MG PO TABS: 1.0000 | ORAL_TABLET | Freq: Two times a day (BID) | ORAL | 1 refills | Status: DC

## 2023-07-01 MED ORDER — DAPAGLIFLOZIN PROPANEDIOL 10 MG PO TABS: 10.0000 mg | ORAL_TABLET | Freq: Every day | ORAL | 0 refills | Status: DC

## 2023-07-01 NOTE — Telephone Encounter (Signed)
 Spoke with pt  Changing pharmacy to [facility] on [address] (previously going to [facility] in [place] ) Pt requesting the following meds for now Farxiga 10 daily (CMA sent refill to phamacy) Protonix  40mg  tabs (per pharmacy refill too soon) CMA will also need to confirm dose daily vs BID Entresto 24-26 one two times daily  (CMA sent to pharmacy) Spironolactone 25mg   daily  (has refill, pharmacy will get ready) Mounjaro 2.5mg /0.38ml inject once a week (refill on file, pharmacy will get ready)  Of note refills were sent on 06/28/23 but mode of transmission was set to "fax" and "print"  Some refill were never received by pharmacy.  CMA will resend rx for farxiga and entresto.  Will also send notice to front office to assign pcp

## 2023-07-01 NOTE — Telephone Encounter (Signed)
 Patient has switched pharmacies, duplicate, request has been addressed by Mrs.Kaye.

## 2023-07-18 ENCOUNTER — Other Ambulatory Visit (HOSPITAL_COMMUNITY): Payer: Self-pay | Admitting: Cardiology

## 2023-07-18 ENCOUNTER — Other Ambulatory Visit: Payer: Self-pay

## 2023-07-18 DIAGNOSIS — I5043 Acute on chronic combined systolic (congestive) and diastolic (congestive) heart failure: Secondary | ICD-10-CM

## 2023-07-18 MED ORDER — SPIRONOLACTONE 25 MG PO TABS: 25.0000 mg | ORAL_TABLET | Freq: Every day | ORAL | 1 refills | Status: DC

## 2023-07-18 MED ORDER — SACUBITRIL-VALSARTAN 24-26 MG PO TABS: 1.0000 | ORAL_TABLET | Freq: Two times a day (BID) | ORAL | 1 refills | Status: DC

## 2023-07-27 DIAGNOSIS — M542 Cervicalgia: Secondary | ICD-10-CM | POA: Diagnosis not present

## 2023-07-31 ENCOUNTER — Encounter: Payer: Self-pay | Admitting: Internal Medicine

## 2023-08-05 ENCOUNTER — Encounter (HOSPITAL_COMMUNITY): Payer: Self-pay | Admitting: Internal Medicine

## 2023-08-05 ENCOUNTER — Telehealth: Payer: Self-pay

## 2023-08-05 NOTE — Telephone Encounter (Signed)
 Procedure:esophagogastroscopy Procedure date: 08/12/23 Procedure location: [facility] Arrival Time: 31am Spoke with the patient Y/N: yes Any prep concerns? no  Has the patient obtained the prep from the pharmacy ? no Do you have a care partner and transportation: yes Any additional concerns? no

## 2023-08-05 NOTE — Progress Notes (Signed)
 Attempted to obtain medical history for pre op call via telephone, unable to reach at this time. HIPAA compliant voicemail message left requesting return call to pre surgical testing department.

## 2023-08-06 DIAGNOSIS — M542 Cervicalgia: Secondary | ICD-10-CM | POA: Diagnosis not present

## 2023-08-08 ENCOUNTER — Encounter: Payer: Self-pay | Admitting: Podiatry

## 2023-08-08 ENCOUNTER — Ambulatory Visit (INDEPENDENT_AMBULATORY_CARE_PROVIDER_SITE_OTHER): Admitting: Podiatry

## 2023-08-08 DIAGNOSIS — B351 Tinea unguium: Secondary | ICD-10-CM | POA: Diagnosis not present

## 2023-08-08 DIAGNOSIS — M79675 Pain in left toe(s): Secondary | ICD-10-CM

## 2023-08-08 DIAGNOSIS — E119 Type 2 diabetes mellitus without complications: Secondary | ICD-10-CM

## 2023-08-08 DIAGNOSIS — M79674 Pain in right toe(s): Secondary | ICD-10-CM

## 2023-08-08 DIAGNOSIS — T148XXA Other injury of unspecified body region, initial encounter: Secondary | ICD-10-CM

## 2023-08-08 NOTE — Progress Notes (Signed)
 This patient presents to the office with chief complaint of long thick nails and hard blackened callus under the left forefoot.  This patient  says there  is  no pain only numbness in her feet.  This patient says there are long thick painful nails.  These nails are painful walking and wearing shoes.  Patient is unable to  self treat his own nails . This patient presents  to the office today for treatment of the  long nails and a treatment of hard blackened skin lesion left forefoot.  General Appearance  Alert, conversant and in no acute stress.  Vascular  Dorsalis pedis and posterior tibial  pulses are  not palpable  bilaterally.  Capillary return is within normal limits  bilaterally. Temperature is within normal limits  bilaterally.  Neurologic  Senn-Weinstein monofilament wire test diminished  bilaterally. Muscle power within normal limits bilaterally.  Nails Thick disfigured discolored nails with subungual debris  from hallux to fifth toes bilaterally. No evidence of bacterial infection or drainage bilaterally.  Orthopedic  No limitations of motion of motion feet .  No crepitus or effusions noted.  HAV  B/L.  Skin  normotropic skin with no porokeratosis noted bilaterally.  No signs of infections or ulcers noted.   Asymptomatic callus sub 4 left foot.  Hematoma formation under 2,3 met head left foot.  Onychomycosis  Diabetes with no foot complications  Hematoma left foot.  Debride nails x 10.  Debridement of hematoma left forefoot plantarly.  Dr.  Meriam Stamp rendered second opinion on the hematoma.   RTC 3 months.   Ruffin Cotton DPM

## 2023-08-11 NOTE — Anesthesia Preprocedure Evaluation (Signed)
 Anesthesia Evaluation  Patient identified by MRN, date of birth, ID band Patient awake    Reviewed: Allergy & Precautions, NPO status , Patient's Chart, lab work & pertinent test results  History of Anesthesia Complications Negative for: history of anesthetic complications  Airway Mallampati: I  TM Distance: >3 FB Neck ROM: Full    Dental  (+) Edentulous Upper, Edentulous Lower   Pulmonary former smoker   breath sounds clear to auscultation       Cardiovascular hypertension, Pt. on medications and Pt. on home beta blockers (-) angina +CHF (Entresto )   Rhythm:Regular Rate:Normal     Neuro/Psych   Anxiety     negative neurological ROS     GI/Hepatic PUD,GERD  Medicated and Controlled,,(+) Cirrhosis         Endo/Other  diabetes (glu 121)  BMI 42 Mounjaro : last 11d ago  Renal/GU negative Renal ROS     Musculoskeletal   Abdominal   Peds  Hematology   Anesthesia Other Findings   Reproductive/Obstetrics                             Anesthesia Physical Anesthesia Plan  ASA: 4  Anesthesia Plan: MAC   Post-op Pain Management: Minimal or no pain anticipated   Induction:   PONV Risk Score and Plan: 2 and Treatment may vary due to age or medical condition  Airway Management Planned: Nasal Cannula and Natural Airway  Additional Equipment: None  Intra-op Plan:   Post-operative Plan:   Informed Consent: I have reviewed the patients History and Physical, chart, labs and discussed the procedure including the risks, benefits and alternatives for the proposed anesthesia with the patient or authorized representative who has indicated his/her understanding and acceptance.       Plan Discussed with: CRNA and Surgeon  Anesthesia Plan Comments:         Anesthesia Quick Evaluation

## 2023-08-12 ENCOUNTER — Encounter (HOSPITAL_COMMUNITY)
Admission: RE | Disposition: A | Discharge status: Home / Self Care | Payer: Self-pay | Source: Home / Self Care | Attending: Internal Medicine

## 2023-08-12 ENCOUNTER — Ambulatory Visit (HOSPITAL_COMMUNITY): Admitting: Anesthesiology

## 2023-08-12 ENCOUNTER — Other Ambulatory Visit: Payer: Self-pay

## 2023-08-12 ENCOUNTER — Ambulatory Visit (HOSPITAL_BASED_OUTPATIENT_CLINIC_OR_DEPARTMENT_OTHER): Admitting: Anesthesiology

## 2023-08-12 ENCOUNTER — Encounter (HOSPITAL_COMMUNITY): Payer: Self-pay | Admitting: Internal Medicine

## 2023-08-12 ENCOUNTER — Ambulatory Visit (HOSPITAL_COMMUNITY)
Admission: RE | Admit: 2023-08-12 | Discharge: 2023-08-12 | Disposition: A | Discharge status: Home / Self Care | Payer: Medicare Other | Attending: Internal Medicine | Admitting: Internal Medicine

## 2023-08-12 DIAGNOSIS — K259 Gastric ulcer, unspecified as acute or chronic, without hemorrhage or perforation: Secondary | ICD-10-CM

## 2023-08-12 DIAGNOSIS — R1013 Epigastric pain: Secondary | ICD-10-CM | POA: Diagnosis present

## 2023-08-12 DIAGNOSIS — I5022 Chronic systolic (congestive) heart failure: Secondary | ICD-10-CM | POA: Insufficient documentation

## 2023-08-12 DIAGNOSIS — K219 Gastro-esophageal reflux disease without esophagitis: Secondary | ICD-10-CM | POA: Insufficient documentation

## 2023-08-12 DIAGNOSIS — K3184 Gastroparesis: Secondary | ICD-10-CM

## 2023-08-12 DIAGNOSIS — K3189 Other diseases of stomach and duodenum: Secondary | ICD-10-CM | POA: Diagnosis not present

## 2023-08-12 DIAGNOSIS — Z87891 Personal history of nicotine dependence: Secondary | ICD-10-CM | POA: Insufficient documentation

## 2023-08-12 DIAGNOSIS — E1122 Type 2 diabetes mellitus with diabetic chronic kidney disease: Secondary | ICD-10-CM | POA: Diagnosis not present

## 2023-08-12 DIAGNOSIS — N183 Chronic kidney disease, stage 3 unspecified: Secondary | ICD-10-CM | POA: Diagnosis not present

## 2023-08-12 DIAGNOSIS — E1143 Type 2 diabetes mellitus with diabetic autonomic (poly)neuropathy: Secondary | ICD-10-CM | POA: Insufficient documentation

## 2023-08-12 DIAGNOSIS — Z8619 Personal history of other infectious and parasitic diseases: Secondary | ICD-10-CM

## 2023-08-12 DIAGNOSIS — Z6841 Body Mass Index (BMI) 40.0 and over, adult: Secondary | ICD-10-CM | POA: Insufficient documentation

## 2023-08-12 DIAGNOSIS — I110 Hypertensive heart disease with heart failure: Secondary | ICD-10-CM | POA: Insufficient documentation

## 2023-08-12 DIAGNOSIS — K2950 Unspecified chronic gastritis without bleeding: Secondary | ICD-10-CM | POA: Diagnosis not present

## 2023-08-12 DIAGNOSIS — E669 Obesity, unspecified: Secondary | ICD-10-CM | POA: Insufficient documentation

## 2023-08-12 DIAGNOSIS — Z7985 Long-term (current) use of injectable non-insulin antidiabetic drugs: Secondary | ICD-10-CM | POA: Insufficient documentation

## 2023-08-12 HISTORY — PX: ESOPHAGOGASTRODUODENOSCOPY (EGD) WITH PROPOFOL: SHX5813

## 2023-08-12 LAB — GLUCOSE, CAPILLARY: Glucose-Capillary: 121 mg/dL — ABNORMAL HIGH (ref 70–99)

## 2023-08-12 SURGERY — ESOPHAGOGASTRODUODENOSCOPY (EGD) WITH PROPOFOL
Anesthesia: Monitor Anesthesia Care

## 2023-08-12 MED ORDER — OXYCODONE HCL 5 MG/5ML PO SOLN: 5.0000 mg | Freq: Once | ORAL | Status: DC | PRN

## 2023-08-12 MED ORDER — MIDAZOLAM HCL 2 MG/2ML IJ SOLN: 0.5000 mg | Freq: Once | INTRAMUSCULAR | Status: DC | PRN

## 2023-08-12 MED ORDER — OXYCODONE HCL 5 MG PO TABS: 5.0000 mg | ORAL_TABLET | Freq: Once | ORAL | Status: DC | PRN

## 2023-08-12 MED ORDER — LIDOCAINE HCL (PF) 2 % IJ SOLN
INTRAMUSCULAR | Status: DC | PRN
  Administered 2023-08-12: 100 mg via INTRADERMAL

## 2023-08-12 MED ORDER — SODIUM CHLORIDE 0.9 % IV SOLN
INTRAVENOUS | Status: DC
Start: 2023-08-12 — End: 2023-08-12

## 2023-08-12 MED ORDER — PROPOFOL 500 MG/50ML IV EMUL
INTRAVENOUS | Status: DC | PRN
Start: 2023-08-12 — End: 2023-08-12
  Administered 2023-08-12: 100 ug/kg/min via INTRAVENOUS
  Administered 2023-08-12: 50 mg via INTRAVENOUS

## 2023-08-12 MED ORDER — MEPERIDINE HCL 50 MG/ML IJ SOLN: 6.2500 mg | INTRAMUSCULAR | Status: DC | PRN

## 2023-08-12 MED ORDER — FENTANYL CITRATE (PF) 100 MCG/2ML IJ SOLN: 25.0000 ug | INTRAMUSCULAR | Status: DC | PRN

## 2023-08-12 MED ORDER — PROPOFOL 500 MG/50ML IV EMUL
INTRAVENOUS | Status: AC
  Filled 2023-08-12: qty 50

## 2023-08-12 MED ORDER — GLYCOPYRROLATE PF 0.2 MG/ML IJ SOSY
PREFILLED_SYRINGE | INTRAMUSCULAR | Status: DC | PRN
  Administered 2023-08-12: .2 mg via INTRAVENOUS

## 2023-08-12 MED ORDER — PHENYLEPHRINE 80 MCG/ML (10ML) SYRINGE FOR IV PUSH (FOR BLOOD PRESSURE SUPPORT)
PREFILLED_SYRINGE | INTRAVENOUS | Status: DC | PRN
  Administered 2023-08-12: 80 ug via INTRAVENOUS

## 2023-08-12 MED ORDER — SODIUM CHLORIDE 0.9 % IV SOLN
INTRAVENOUS | Status: AC | PRN
  Administered 2023-08-12: 500 mL via INTRAVENOUS

## 2023-08-12 SURGICAL SUPPLY — 13 items
BLOCK BITE 60FR ADLT L/F BLUE (MISCELLANEOUS) ×1 IMPLANT
ELECTRODE REM PT RTRN 9FT ADLT (ELECTROSURGICAL) IMPLANT
FORCEP RJ3 GP 1.8X160 W-NEEDLE (CUTTING FORCEPS) IMPLANT
FORCEPS BIOP RAD 4 LRG CAP 4 (CUTTING FORCEPS) IMPLANT
NDL SCLEROTHERAPY 25GX240 (NEEDLE) IMPLANT
NEEDLE SCLEROTHERAPY 25GX240 (NEEDLE) IMPLANT
PROBE APC STR FIRE (PROBE) IMPLANT
PROBE INJECTION GOLD 7FR (MISCELLANEOUS) IMPLANT
SNARE SHORT THROW 13M SML OVAL (MISCELLANEOUS) IMPLANT
SYR 50ML LL SCALE MARK (SYRINGE) IMPLANT
TUBING ENDO SMARTCAP PENTAX (MISCELLANEOUS) ×2 IMPLANT
TUBING IRRIGATION ENDOGATOR (MISCELLANEOUS) ×1 IMPLANT
WATER STERILE IRR 1000ML POUR (IV SOLUTION) IMPLANT

## 2023-08-12 NOTE — Discharge Instructions (Signed)

## 2023-08-12 NOTE — Op Note (Signed)
 [facility] Patient Name: Tina  Lara Procedure Date: 08/12/2023 MRN: [id number] Attending MD: Nannette Babe , MD, [id number] Date of Birth: 1955-12-08 CSN: [id number] Age: 68 Admit Type: Outpatient Procedure:                Upper GI endoscopy Indications:              Epigastric abdominal pain, hx of H. Pylori                            gastritis with gastric ulcers and small                            hyperplastic polyps seen at EGD in April 2024,                            treated for H. Pylori at that time Providers:                Amber Bail. Bridgett Camps, MD, Ila Malay, RN, Lorenzo Romberg, RN, Arlin Benes, Technician, Manuela Sella, CRNA Referring MD:             [facility]                           Lasandra Points Medicines:                Monitored Anesthesia Care Complications:            No immediate complications. Estimated Blood Loss:     Estimated blood loss was minimal. Procedure:                Pre-Anesthesia Assessment:                           - Prior to the procedure, a History and Physical                            was performed, and patient medications and                            allergies were reviewed. The patient's tolerance of                            previous anesthesia was also reviewed. The risks                            and benefits of the procedure and the sedation                            options and risks were discussed with the patient.  All questions were answered, and informed consent                            was obtained. Prior Anticoagulants: The patient has                            taken no anticoagulant or antiplatelet agents. ASA                            Grade Assessment: III - A patient with severe                            systemic disease. After reviewing the risks and                            benefits, the  patient was deemed in satisfactory                            condition to undergo the procedure.                           After obtaining informed consent, the endoscope was                            passed under direct vision. Throughout the                            procedure, the patient's blood pressure, pulse, and                            oxygen saturations were monitored continuously. The                            GIF-H190 ([id number]) Olympus endoscope was introduced                            through the mouth, and advanced to the duodenal                            bulb. The upper GI endoscopy was accomplished                            without difficulty. The patient tolerated the                            procedure well. Scope In: Scope Out: Findings:      The examined esophagus was normal.      A large amount of food (residue) was found in the gastric body and in       the gastric antrum.      Suspect gastroparesis due to retained gastric contents.      Diffuse mildly erythematous mucosa without bleeding was found in the       gastric body and in the gastric antrum. Biopsies were taken with a cold  forceps for Helicobacter pylori testing.      The examined duodenum was normal. Impression:               - Normal esophagus.                           - A large amount of food (residue) in the stomach.                           - Gastroparesis.                           - Erythematous mucosa in the gastric body and                            antrum. Biopsied.                           - Normal examined duodenum. Moderate Sedation:      N/A Recommendation:           - Patient has a contact number available for                            emergencies. The signs and symptoms of potential                            delayed complications were discussed with the                            patient. Return to normal activities tomorrow.                            Written  discharge instructions were provided to the                            patient.                           - Gastroparesis diet is recommended.                           - Continue present medications.                           - I expect epigastric pain is related to                            gastroparesis. Follow-up gastric biopsies to ensure                            eradication of H. Pylori infection.                           - Await pathology results. Procedure Code(s):        --- Professional ---  01027, Esophagogastroduodenoscopy, flexible,                            transoral; with biopsy, single or multiple Diagnosis Code(s):        --- Professional ---                           K31.84, Gastroparesis                           K31.89, Other diseases of stomach and duodenum                           R10.13, Epigastric pain CPT copyright 2022 American Medical Association. All rights reserved. The codes documented in this report are preliminary and upon coder review may  be revised to meet current compliance requirements. Nannette Babe, MD 08/12/2023 8:17:07 AM This report has been signed electronically. Number of Addenda: 0

## 2023-08-12 NOTE — Anesthesia Postprocedure Evaluation (Signed)
 Anesthesia Post Note  Patient: Tina Lara  Procedure(s) Performed: ESOPHAGOGASTRODUODENOSCOPY (EGD) WITH PROPOFOL      Patient location during evaluation: Endoscopy Anesthesia Type: MAC Level of consciousness: awake and alert, patient cooperative and oriented Pain management: pain level controlled Vital Signs Assessment: post-procedure vital signs reviewed and stable Respiratory status: spontaneous breathing, nonlabored ventilation and respiratory function stable Cardiovascular status: blood pressure returned to baseline and stable Postop Assessment: no apparent nausea or vomiting and able to ambulate Anesthetic complications: no   No notable events documented.  Last Vitals:  Vitals:   08/12/23 0830 08/12/23 0840  BP: (!) 140/71 135/70  Pulse: 72 68  Resp: 14 15  Temp:    SpO2: 98% 97%    Last Pain:  Vitals:   08/12/23 0840  TempSrc:   PainSc: 0-No pain                 Lorann Tani,E. Danique Hartsough

## 2023-08-12 NOTE — Transfer of Care (Signed)
 Immediate Anesthesia Transfer of Care Note  Patient: Tina Lara  Procedure(s) Performed: Procedure(s): ESOPHAGOGASTRODUODENOSCOPY (EGD) WITH PROPOFOL  (N/A)  Patient Location: PACU  Anesthesia Type:MAC  Level of Consciousness: Patient easily awoken, comfortable, cooperative, following commands, responds to stimulation.   Airway & Oxygen Therapy: Patient spontaneously breathing, ventilating well, oxygen via simple oxygen mask.  Post-op Assessment: Report given to PACU RN, vital signs reviewed and stable, moving all extremities.   Post vital signs: Reviewed and stable.  Complications: No apparent anesthesia complications  Last Vitals:  Vitals Value Taken Time  BP 141/95 08/12/23 0816  Temp    Pulse 84 08/12/23 0816  Resp 16 08/12/23 0816  SpO2 100 08/12/23 0816    Last Pain:  Vitals:   08/12/23 0720  TempSrc: Temporal  PainSc: 0-No pain         Complications: No notable events documented.

## 2023-08-12 NOTE — Anesthesia Procedure Notes (Signed)
 Procedure Name: MAC Date/Time: 08/12/2023 7:59 AM  Performed by: Manuela Sella, CRNAPre-anesthesia Checklist: Patient identified, Emergency Drugs available, Suction available and Patient being monitored Patient Re-evaluated:Patient Re-evaluated prior to induction Oxygen Delivery Method: Simple face mask Preoxygenation: Pre-oxygenation with 100% oxygen Induction Type: IV induction Placement Confirmation: positive ETCO2 and breath sounds checked- equal and bilateral Dental Injury: Teeth and Oropharynx as per pre-operative assessment

## 2023-08-12 NOTE — H&P (Signed)
 GASTROENTEROLOGY PROCEDURE H&P NOTE   Primary Care Physician: Pcp, No    Reason for Procedure:  Follow-up H. pylori gastritis and gastric ulcer disease seen April 2024  Plan:    EGD  Patient is appropriate for endoscopic procedure(s) in the outpatient hospital setting.  The nature of the procedure, as well as the risks, benefits, and alternatives were carefully and thoroughly reviewed with the patient. Ample time for discussion and questions allowed. The patient understood, was satisfied, and agreed to proceed.     HPI: Tina Lara is a 68 y.o. female who presents for EGD.  Medical history as below.  Tolerated the prep.  No recent chest pain or shortness of breath.  No abdominal pain today though she has experienced intermittent epigastric discomfort usually related to meals.  No nausea or vomiting.  Past Medical History:  Diagnosis Date   Anxiety    Cataract    NS OD   Chest pain 08/14/2016   Chronic systolic heart failure (HCC)    Complication of anesthesia    Per patient "needs a lot of anesthesia w/surgery" ,I had toruble with a spinal block not taking "   Diabetes mellitus without complication (HCC)    Type 2   Diabetic retinopathy (HCC)    BDR   Full dentures    GERD (gastroesophageal reflux disease)    Heart failure (HCC)    History of surgery on arm    right arm broken   Hyperlipidemia    Hypertension    never diagnosed by physician per pt   Macular degeneration    Obesity     Past Surgical History:  Procedure Laterality Date   BIOPSY  09/30/2017   Procedure: BIOPSY;  Surgeon: Nannette Babe, MD;  Location: [facility];  Service: Gastroenterology;;   BIOPSY  07/31/2022   Procedure: BIOPSY;  Surgeon: Nannette Babe, MD;  Location: [facility];  Service: Gastroenterology;;   CERVICAL CONIZATION W/BX N/A 09/10/2017   Procedure: CONIZATION CERVIX WITH BIOPSY - COLD KNIFE;  Surgeon: Ana Balling, MD;  Location: [facility];  Service: Gynecology;  Laterality:  N/A;   CESAREAN SECTION  1989   x 1   CHOLECYSTECTOMY     gall stones   COLONOSCOPY WITH PROPOFOL  N/A 09/30/2017   Procedure: COLONOSCOPY WITH PROPOFOL ;  Surgeon: Nannette Babe, MD;  Location: [facility];  Service: Gastroenterology;  Laterality: N/A;   COLONOSCOPY WITH PROPOFOL  N/A 07/31/2022   Procedure: COLONOSCOPY WITH PROPOFOL ;  Surgeon: Nannette Babe, MD;  Location: [facility];  Service: Gastroenterology;  Laterality: N/A;   DILATION AND CURETTAGE OF UTERUS N/A 09/10/2017   Procedure: DILATATION AND CURETTAGE;  Surgeon: Ana Balling, MD;  Location: [facility];  Service: Gynecology;  Laterality: N/A;   ESOPHAGOGASTRODUODENOSCOPY (EGD) WITH PROPOFOL  N/A 07/31/2022   Procedure: ESOPHAGOGASTRODUODENOSCOPY (EGD) WITH PROPOFOL ;  Surgeon: Nannette Babe, MD;  Location: [facility];  Service: Gastroenterology;  Laterality: N/A;   HEMOSTASIS CLIP PLACEMENT  07/31/2022   Procedure: HEMOSTASIS CLIP PLACEMENT;  Surgeon: Nannette Babe, MD;  Location: [facility];  Service: Gastroenterology;;   MULTIPLE TOOTH EXTRACTIONS     dentures upper and lowers   POLYPECTOMY  09/30/2017   Procedure: POLYPECTOMY;  Surgeon: Nannette Babe, MD;  Location: [facility];  Service: Gastroenterology;;   RIGHT/LEFT HEART CATH AND CORONARY ANGIOGRAPHY N/A 02/23/2022   Procedure: RIGHT/LEFT HEART CATH AND CORONARY ANGIOGRAPHY;  Surgeon: Arty Binning, MD;  Location: [facility];  Service: Cardiovascular;  Laterality: N/A;   TONSILLECTOMY      Prior to Admission medications   Medication Sig Start Date End Date Taking? Authorizing Provider  acetaminophen  (TYLENOL ) 500 MG tablet Take 1,500 mg by mouth every 6 (six) hours as needed for moderate pain or headache.    Yes [provider]  aspirin  EC 81 MG tablet Take 1 tablet (81 mg total) by mouth daily. 03/07/22  Yes Guilloud, Carolyn, MD  carvedilol  (COREG ) 6.25 MG tablet TAKE 1 TABLET(6.25 MG) BY MOUTH TWICE DAILY WITH A MEAL 03/15/23  Yes Lasandra Points, MD   dapagliflozin  propanediol (FARXIGA ) 10 MG TABS tablet TAKE 1 TABLET(10 MG) BY MOUTH DAILY. NEED FOLLOW UP APPOINTMENT FOR MORE REFILLS 07/19/23  Yes Sabharwal, Aditya, DO  neomycin-bacitracin-polymyxin (NEOSPORIN) [phone] ointment Apply 1 Application topically daily as needed (On stomach).   Yes [provider]  sacubitril -valsartan  (ENTRESTO ) 24-26 MG Take 1 tablet by mouth 2 (two) times daily. 07/18/23  Yes Driscilla George, MD  spironolactone  ([name] ) 25 MG tablet Take 1 tablet (25 mg total) by mouth daily. TAKE 1 TABLET(25 MG) BY MOUTH DAILY 07/18/23  Yes Machen, Julie, MD  torsemide  (DEMADEX ) 20 MG tablet Take 2 tablets (40 mg total) by mouth every morning AND 1 tablet (20 mg total) every evening. 06/06/23  Yes Lee, [place], NP  Benzocaine, Topical, (BOIL EASE MAXIMUM STRENGTH) 20 % OINT Apply 1 application  topically daily as needed (On Stomach).    [provider]  HYDROcodone -acetaminophen  (NORCO/VICODIN) 5-325 MG tablet Take 1-2 tablets by mouth every 6 (six) hours as needed. 04/02/23   Stephany Ehrich, MD  Menthol , Topical Analgesic, (BIOFREEZE EX) Apply 1 application  topically daily as needed (back pain).    [provider]  pantoprazole  (PROTONIX ) 40 MG tablet Take 1 tablet (40 mg total) by mouth daily. TAKE 1 TABLET(40 MG) BY MOUTH TWICE DAILY BEFORE A MEAL 06/28/23   Priscella Brooms, DO  tirzepatide  (MOUNJARO ) 2.5 MG/0.5ML Pen Inject 2.5 mg into the skin once a week. 06/28/23   Priscella Brooms, DO    No current facility-administered medications for this encounter.    Allergies as of 02/06/2023 - Review Complete 01/29/2023  Allergen Reaction Noted   Percocet [oxycodone -acetaminophen ] Itching 09/13/2017    Family History  Problem Relation Age of Onset   Kidney disease Mother    Heart disease Mother    Diabetes Mother    Seizures Father    Heart disease Father    Other Father    Breast cancer Neg Hx     Social History   Socioeconomic History    Marital status: Divorced    Spouse name: Not on file   Number of children: 1   Years of education: Not on file   Highest education level: High school graduate  Occupational History   Occupation: disability  Tobacco Use   Smoking status: Former    Current packs/day: 0.25    Average packs/day: 0.3 packs/day for 6.0 years (1.5 ttl pk-yrs)    Types: Cigarettes   Smokeless tobacco: Never   Tobacco comments:    quit 36 yrs ago  Vaping Use   Vaping status: Never Used  Substance and Sexual Activity   Alcohol use: Not Currently    Comment: occasionally   Drug use: No   Sexual activity: Not Currently    Birth control/protection: None  Other Topics Concern   Not on file  Social History Narrative   Not on file   Social Drivers of Health  Financial Resource Strain: High Risk (02/26/2022)   Overall Financial Resource Strain (CARDIA)    Difficulty of Paying Living Expenses: Very hard  Food Insecurity: No Food Insecurity (02/21/2022)   Hunger Vital Sign    Worried About Running Out of Food in the Last Year: Never true    Ran Out of Food in the Last Year: Never true  Recent Concern: Food Insecurity - Food Insecurity Present (02/14/2022)   Hunger Vital Sign    Worried About Running Out of Food in the Last Year: Sometimes true    Ran Out of Food in the Last Year: Sometimes true  Transportation Needs: No Transportation Needs (03/01/2022)   PRAPARE - Administrator, Civil Service (Medical): No    Lack of Transportation (Non-Medical): No  Physical Activity: Inactive (02/14/2022)   Exercise Vital Sign    Days of Exercise per Week: 0 days    Minutes of Exercise per Session: 0 min  Stress: No Stress Concern Present (02/14/2022)   [employer] of Occupational Health - Occupational Stress Questionnaire    Feeling of Stress : Not at all  Social Connections: Moderately Isolated (02/14/2022)   Social Connection and Isolation Panel [NHANES]    Frequency of Communication with  Friends and Family: Twice a week    Frequency of Social Gatherings with Friends and Family: Once a week    Attends Religious Services: More than 4 times per year    Active Member of [employer] or Organizations: No    Attends Banker Meetings: Never    Marital Status: Divorced  Catering manager Violence: Not At Risk (02/21/2022)   Humiliation, Afraid, Rape, and Kick questionnaire    Fear of Current or Ex-Partner: No    Emotionally Abused: No    Physically Abused: No    Sexually Abused: No    Physical Exam: Vital signs in last 24 hours: @BP  (!) 190/81   Pulse 64   Temp 98.9 F (37.2 C) (Temporal)   Resp 16   Ht 5\' 10"  (1.778 m)   Wt 132.9 kg   LMP 10/14/2017   SpO2 95%   BMI 42.04 kg/m  GEN: NAD EYE: Sclerae anicteric ENT: MMM CV: Non-tachycardic Pulm: CTA b/l GI: Soft, NT/ND NEURO:  Alert & Oriented x 3   Laurell Pond, MD [facility]  08/12/2023 7:31 AM

## 2023-08-13 ENCOUNTER — Encounter (HOSPITAL_COMMUNITY): Payer: Self-pay | Admitting: Internal Medicine

## 2023-08-14 ENCOUNTER — Ambulatory Visit: Payer: Self-pay | Admitting: Internal Medicine

## 2023-08-14 LAB — SURGICAL PATHOLOGY

## 2023-08-22 DIAGNOSIS — M542 Cervicalgia: Secondary | ICD-10-CM | POA: Diagnosis not present

## 2023-08-23 ENCOUNTER — Other Ambulatory Visit (HOSPITAL_COMMUNITY): Payer: Self-pay | Admitting: Cardiology

## 2023-08-27 ENCOUNTER — Other Ambulatory Visit: Payer: Self-pay | Admitting: Student

## 2023-08-27 DIAGNOSIS — E119 Type 2 diabetes mellitus without complications: Secondary | ICD-10-CM

## 2023-08-27 MED ORDER — DAPAGLIFLOZIN PROPANEDIOL 10 MG PO TABS: 10.0000 mg | ORAL_TABLET | Freq: Every day | ORAL | 2 refills | Status: DC

## 2023-08-27 MED ORDER — MOUNJARO 2.5 MG/0.5ML ~~LOC~~ SOAJ: 2.5000 mg | SUBCUTANEOUS | 2 refills | Status: DC

## 2023-08-27 NOTE — Telephone Encounter (Signed)
 Copied from CRM [id number]. Topic: Clinical - Medication Refill >> Aug 27, 2023  9:33 AM Shelby Dessert H wrote: Medication: tirzepatide  (MOUNJARO ) 2.5 MG/0.5ML Pen  Has the patient contacted their pharmacy? Yes (Agent: If no, request that the patient contact the pharmacy for the refill. If patient does not wish to contact the pharmacy document the reason why and proceed with request.) (Agent: If yes, when and what did the pharmacy advise?)  This is the patient's preferred pharmacy:  [facility] [address], [place] - [address] [place] [address] Phone: [phone] Fax: [phone]  Is this the correct pharmacy for this prescription? Yes If no, delete pharmacy and type the correct one.   Has the prescription been filled recently? No  Is the patient out of the medication? Yes  Has the patient been seen for an appointment in the last year OR does the patient have an upcoming appointment? Yes  Can we respond through MyChart? Yes  Agent: Please be advised that Rx refills may take up to 3 business days. We ask that you follow-up with your pharmacy.

## 2023-08-27 NOTE — Addendum Note (Signed)
 Addended by: Priscella Brooms on: 08/27/2023 04:46 PM   Modules accepted: Orders

## 2023-08-27 NOTE — Telephone Encounter (Signed)
 Copied from CRM [id number]. Topic: Clinical - Medication Refill >> Aug 27, 2023  9:32 AM Shelby Dessert H wrote: Medication: dapagliflozin  propanediol (FARXIGA ) 10 MG TABS tablet  Has the patient contacted their pharmacy? Yes (Agent: If no, request that the patient contact the pharmacy for the refill. If patient does not wish to contact the pharmacy document the reason why and proceed with request.) (Agent: If yes, when and what did the pharmacy advise?)  This is the patient's preferred pharmacy:  [facility] [address] [place] - [address] [place] [address] Phone: [phone] Fax: [phone]  Is this the correct pharmacy for this prescription? Yes If no, delete pharmacy and type the correct one.   Has the prescription been filled recently? No  Is the patient out of the medication? Yes  Has the patient been seen for an appointment in the last year OR does the patient have an upcoming appointment? Yes  Can we respond through MyChart? Yes  Agent: Please be advised that Rx refills may take up to 3 business days. We ask that you follow-up with your pharmacy.

## 2023-08-27 NOTE — Telephone Encounter (Signed)
 Sent farxiga  in

## 2023-08-30 ENCOUNTER — Other Ambulatory Visit (HOSPITAL_COMMUNITY): Payer: Self-pay

## 2023-08-30 DIAGNOSIS — I5022 Chronic systolic (congestive) heart failure: Secondary | ICD-10-CM

## 2023-08-30 MED ORDER — DAPAGLIFLOZIN PROPANEDIOL 10 MG PO TABS: 10.0000 mg | ORAL_TABLET | Freq: Every day | ORAL | 0 refills | Status: DC

## 2023-09-27 ENCOUNTER — Telehealth (HOSPITAL_COMMUNITY): Payer: Self-pay | Admitting: Cardiology

## 2023-09-30 ENCOUNTER — Telehealth: Payer: Self-pay | Admitting: Student

## 2023-09-30 ENCOUNTER — Other Ambulatory Visit: Payer: Self-pay

## 2023-09-30 MED ORDER — SACUBITRIL-VALSARTAN 24-26 MG PO TABS: 1.0000 | ORAL_TABLET | Freq: Two times a day (BID) | ORAL | 1 refills | Status: DC

## 2023-09-30 NOTE — Telephone Encounter (Unsigned)
 Copied from CRM [id number]. Topic: Clinical - Medication Refill >> Sep 30, 2023 10:49 AM Cherylann S wrote: Medication: entresto  24-26 MG take one tablet twice daily   Has the patient contacted their pharmacy? Yes (Agent: If no, request that the patient contact the pharmacy for the refill. If patient does not wish to contact the pharmacy document the reason why and proceed with request.) (Agent: If yes, when and what did the pharmacy advise?)  This is the patient's preferred pharmacy:  [facility] [address] [place] - [address] [place] [address] Phone: [phone] Fax: [phone]  Is this the correct pharmacy for this prescription? Yes If no, delete pharmacy and type the correct one.   Has the prescription been filled recently? No  Is the patient out of the medication? Yes  Has the patient been seen for an appointment in the last year OR does the patient have an upcoming appointment? Yes  Can we respond through MyChart? Yes  Agent: Please be advised that Rx refills may take up to 3 business days. We ask that you follow-up with your pharmacy.

## 2023-11-07 ENCOUNTER — Ambulatory Visit: Admitting: Podiatry

## 2023-11-25 ENCOUNTER — Other Ambulatory Visit: Payer: Self-pay | Admitting: Student

## 2023-11-25 DIAGNOSIS — I5022 Chronic systolic (congestive) heart failure: Secondary | ICD-10-CM

## 2023-11-25 DIAGNOSIS — I5043 Acute on chronic combined systolic (congestive) and diastolic (congestive) heart failure: Secondary | ICD-10-CM

## 2023-11-25 NOTE — Telephone Encounter (Signed)
 Copied from CRM [id number]. Topic: Clinical - Medication Refill >> Nov 25, 2023  1:22 PM Carmell R wrote: Medication:  dapagliflozin  propanediol (FARXIGA ) 10 MG TABS tablet spironolactone  ([name] ) 25 MG tablet carvedilol  (COREG ) 6.25 MG tablet pantoprazole  (PROTONIX ) 40 MG tablet sacubitril -valsartan  (ENTRESTO ) 24-26 MG  Has the patient contacted their pharmacy? Yes. They informed to pt there are no more refills.  This is the patient's preferred pharmacy:  [facility] [address], [place] - [address] [place] [address] Phone: [phone] Fax: [phone]  Is this the correct pharmacy for this prescription? Yes  Has the prescription been filled recently? Yes  Is the patient out of the medication? No, but only has 2 left of Farxiga .  Has the patient been seen for an appointment in the last year OR does the patient have an upcoming appointment? Yes  Can we respond through MyChart? Yes  Agent: Please be advised that Rx refills may take up to 3 business days. We ask that you follow-up with your pharmacy.

## 2023-11-26 MED ORDER — CARVEDILOL 6.25 MG PO TABS: 6.2500 mg | ORAL_TABLET | Freq: Two times a day (BID) | ORAL | 0 refills | Status: DC

## 2023-11-26 MED ORDER — PANTOPRAZOLE SODIUM 40 MG PO TBEC: 40.0000 mg | DELAYED_RELEASE_TABLET | Freq: Every day | ORAL | 0 refills | Status: DC

## 2023-11-26 MED ORDER — SPIRONOLACTONE 25 MG PO TABS: 25.0000 mg | ORAL_TABLET | Freq: Every day | ORAL | 0 refills | Status: DC

## 2023-11-26 MED ORDER — DAPAGLIFLOZIN PROPANEDIOL 10 MG PO TABS: 10.0000 mg | ORAL_TABLET | Freq: Every day | ORAL | 0 refills | Status: AC

## 2023-11-26 MED ORDER — SACUBITRIL-VALSARTAN 24-26 MG PO TABS: 1.0000 | ORAL_TABLET | Freq: Two times a day (BID) | ORAL | 1 refills | Status: DC

## 2023-11-28 ENCOUNTER — Other Ambulatory Visit: Payer: Self-pay | Admitting: Internal Medicine

## 2023-11-28 ENCOUNTER — Other Ambulatory Visit: Payer: Self-pay | Admitting: Student

## 2023-11-28 DIAGNOSIS — I5043 Acute on chronic combined systolic (congestive) and diastolic (congestive) heart failure: Secondary | ICD-10-CM

## 2023-11-28 NOTE — Telephone Encounter (Signed)
 Pharmacy requesting a 90 day supply  Medication sent to pharmacy.

## 2023-12-15 NOTE — Progress Notes (Signed)
 Established Patient Office Visit  Subjective   Patient ID: Tina Lara, female    DOB: 08-23-1955  Age: 68 y.o. MRN: [id number]  Chief Complaint  Patient presents with   Shoulder Pain    Tina Lara is a 68 y.o. who presents to the clinic for Right thoracic back pian, T2DM, post menopausal bleeding . Please see problem based assessment and plan for additional details.   Patient Active Problem List   Diagnosis Date Noted   Spasm of right trapezius muscle 12/16/2023   Postmenopausal bleeding 12/16/2023   History of Helicobacter pylori infection 08/12/2023   Abdominal pain, epigastric 08/12/2023   Muscle strain of gluteal region, left, initial encounter 04/17/2023   Pain due to onychomycosis of toenails of both feet 01/29/2023   Onychomycosis 01/02/2023   Gastrocnemius tear 01/02/2023   Acute gastric ulcer with hemorrhage 07/31/2022   Gastric polyps 07/31/2022   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   CKD (chronic kidney disease), stage III (HCC) 03/07/2022   Chronic HFrEF (heart failure with reduced ejection fraction) (HCC) 02/21/2022   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   Cirrhosis (HCC) 02/07/2022   Constipation 02/07/2022   GERD (gastroesophageal reflux disease) 01/15/2022   Intertrigo 09/06/2021   Iron deficiency anemia 08/24/2020   HLD (hyperlipidemia) 05/13/2019   Abnormal Pap smear of cervix 05/07/2017   HTN (hypertension) 08/29/2016   Health care maintenance 08/29/2016   Diabetes mellitus without complication (HCC) 08/29/2016      Objective:     BP 119/63 (BP Location: Right Arm, Patient Position: Sitting, Cuff Size: Large)   Pulse 77   Temp 97.9 F (36.6 C) (Oral)   Ht 5' 9 (1.753 m)   Wt (!) 309 lb 3.2 oz (140.3 kg)   LMP 10/14/2017   SpO2 96%   BMI 45.66 kg/m  BP Readings from Last 3 Encounters:  12/16/23 119/63  08/12/23 135/70  06/06/23 104/64   Wt Readings from Last 3 Encounters:  12/16/23 (!) 309 lb  3.2 oz (140.3 kg)  08/12/23 293 lb (132.9 kg)  06/06/23 298 lb 6.4 oz (135.4 kg)      Physical Exam Vitals reviewed.  Constitutional:      General: She is not in acute distress.    Appearance: She is morbidly obese. She is not ill-appearing, toxic-appearing or diaphoretic.  Cardiovascular:     Rate and Rhythm: Normal rate and regular rhythm.     Heart sounds: No murmur heard. Pulmonary:     Effort: Pulmonary effort is normal. No respiratory distress.     Breath sounds: Normal breath sounds. No wheezing or rales.  Musculoskeletal:       Back:     Comments: Tenderness to palpate right trapezius and levator scapulae, pain is reproducible.  Spurling's test negative B/L UE ROM intact B/L UE 5/5 motor strength B/L LE 5/5 motor strength   Skin:    General: Skin is warm and dry.  Neurological:     Mental Status: She is alert.  Psychiatric:        Mood and Affect: Mood and affect normal.      No results found for any visits on 12/16/23.  Last metabolic panel Lab Results  Component Value Date   GLUCOSE 122 (H) 06/06/2023   NA 140 06/06/2023   K 4.1 06/06/2023   CL 102 06/06/2023   CO2 27 06/06/2023   BUN 32 (H) 06/06/2023   CREATININE 1.86 (H) 06/06/2023   GFRNONAA 30 (L) 06/06/2023   CALCIUM   9.0 06/06/2023   PROT 8.4 01/02/2023   ALBUMIN 4.2 01/02/2023   LABGLOB 4.2 01/02/2023   AGRATIO 0.9 (L) 03/07/2022   BILITOT 0.3 01/02/2023   ALKPHOS 103 01/02/2023   AST 21 01/02/2023   ALT 15 01/02/2023   ANIONGAP 11 06/06/2023   Last lipids Lab Results  Component Value Date   CHOL 118 09/06/2021   HDL 33 (L) 09/06/2021   LDLCALC 72 09/06/2021   TRIG 57 09/06/2021   CHOLHDL 3.6 09/06/2021   Last hemoglobin A1c Lab Results  Component Value Date   HGBA1C 7.1 (A) 04/17/2023      The ASCVD Risk score (Arnett DK, et al., 2019) failed to calculate for the following reasons:   The valid total cholesterol range is 130 to 320 mg/dL    Assessment & Plan:   Problem  List Items Addressed This Visit       Endocrine   Diabetes mellitus without complication (HCC) - Primary (Chronic)   Patient has a history of type 2 diabetes on a regimen of Farxiga  10 mg and Mounjaro  2.5 mg weekly.  Patient's last A1c was 7.1 in January 2025.  Patient denies any side effects from Mounjaro  2.5 mg.  Plan: - Continue Farxiga  10 mg - Increase Mounjaro  to 5 mg for diabetes and weight loss, future orders for Mounjaro  7.5 and 10 mg were sent to the pharmacy.  Patient was provided instructions on when to start the higher dosages of Mounjaro . -A1c, urine ACR, BMP, ophthalmology referral placed today      Relevant Medications   tirzepatide  (MOUNJARO ) 5 MG/0.5ML Pen   tirzepatide  (MOUNJARO ) 7.5 MG/0.5ML Pen (Start on 01/13/2024)   tirzepatide  (MOUNJARO ) 10 MG/0.5ML Pen (Start on 02/10/2024)   Other Relevant Orders   Hemoglobin A1c   Basic metabolic panel with GFR   Microalbumin / Creatinine Urine Ratio   Ambulatory referral to Ophthalmology     Musculoskeletal and Integument   Spasm of right trapezius muscle   Patient presents with a 2-week history of right thoracic/  shoulder blade back pain.  She reported the pain feels like a spasm that lasts for a few seconds.  Her pain radiates from the right thoracic region to the midline.  Her pain is worse with lifting.  She has tried to take Tylenol  at home but this has not helped the pain.  She denies recent trauma or injury.  She denies numbness but does have chronic paresthesias only in the right arm that has not changed.  She denies recent chest pain or shortness of breath or heart burn that correlates with her right thoracic pain.  She denies new onset urinary or fecal incontinence.  Of note , she was seeing orthopedics in May for neck pain and an MRI obtained at that time showed: MRI: moderate neuroforaminal narrowing and cervical spondylosis at C5-6. Cervical spondylosis C4-5. C6-7. No high-grade stenosis disc herniation or  significant neural compressive lesion. Moderate to severe cervical foraminal stenosis at C6-7 on the left   On exam, bilateral shoulder range of motion is intact, Spurling test negative, right trapezius and right levator scapula a muscle is tender to palpate, tightness is present in both muscles mentioned above.  I suspect the patient has a right trapezius and right levator scapulae spasm. Plan: -PT - Patient was provided printout exercises to start in the meantime -Robaxin  500 mg 3 times daily as needed, 5-day course sent -Lidocaine  patches sent -Voltaren  gel ordered        Other  Postmenopausal bleeding   Patient reports concern of postmenopausal bleeding that she experienced for 1 week about 1 month ago.  Patient reports that she went through menopause about 10 years ago and had bleeding postmenopausal a few years back.  At that time a pelvic ultrasound was obtained in 2018 which showed an endometrial thickness of 8.6 mm.  Patient denies any additional bleeding since 2018 until now.  Plan: -Pelvic US  ordered -Gynecology referral ordered -I stressed the importance of gynecology follow-up with the patient, patient understands      Other Visit Diagnoses       Encounter for screening mammogram for malignant neoplasm of breast       Relevant Orders   MM 3D SCREENING MAMMOGRAM BILATERAL BREAST     Screening for osteoporosis       Relevant Orders   DG Bone Density     Post-menopausal bleeding       Relevant Orders   Ambulatory referral to Obstetrics / Gynecology   US  Pelvic Complete With Transvaginal     Trapezius muscle spasm       Relevant Orders   Ambulatory referral to Physical Therapy       Return in about 3 months (around 03/16/2024) for Chronic conditions .    Damien Lease, DO

## 2023-12-16 ENCOUNTER — Ambulatory Visit (INDEPENDENT_AMBULATORY_CARE_PROVIDER_SITE_OTHER): Payer: Self-pay | Admitting: Student

## 2023-12-16 VITALS — BP 119/63 | HR 77 | Temp 97.9°F | Ht 69.0 in | Wt 309.2 lb

## 2023-12-16 DIAGNOSIS — E119 Type 2 diabetes mellitus without complications: Secondary | ICD-10-CM | POA: Diagnosis not present

## 2023-12-16 DIAGNOSIS — M62830 Muscle spasm of back: Secondary | ICD-10-CM | POA: Diagnosis not present

## 2023-12-16 DIAGNOSIS — Z13820 Encounter for screening for osteoporosis: Secondary | ICD-10-CM

## 2023-12-16 DIAGNOSIS — Z7985 Long-term (current) use of injectable non-insulin antidiabetic drugs: Secondary | ICD-10-CM | POA: Diagnosis not present

## 2023-12-16 DIAGNOSIS — N950 Postmenopausal bleeding: Secondary | ICD-10-CM | POA: Diagnosis not present

## 2023-12-16 DIAGNOSIS — M62838 Other muscle spasm: Secondary | ICD-10-CM | POA: Diagnosis not present

## 2023-12-16 DIAGNOSIS — Z1231 Encounter for screening mammogram for malignant neoplasm of breast: Secondary | ICD-10-CM

## 2023-12-16 MED ORDER — MOUNJARO 7.5 MG/0.5ML ~~LOC~~ SOAJ: 7.5000 mg | SUBCUTANEOUS | 0 refills | Status: DC

## 2023-12-16 MED ORDER — LIDOCAINE 5 % EX PTCH: 1.0000 | MEDICATED_PATCH | CUTANEOUS | 0 refills | Status: AC

## 2023-12-16 MED ORDER — DICLOFENAC SODIUM 1 % EX GEL: 4.0000 g | Freq: Four times a day (QID) | CUTANEOUS | 3 refills | Status: AC

## 2023-12-16 MED ORDER — METHOCARBAMOL 500 MG PO TABS: 500.0000 mg | ORAL_TABLET | Freq: Three times a day (TID) | ORAL | 0 refills | Status: AC | PRN

## 2023-12-16 MED ORDER — MOUNJARO 10 MG/0.5ML ~~LOC~~ SOAJ: 10.0000 mg | SUBCUTANEOUS | 0 refills | Status: DC

## 2023-12-16 MED ORDER — MOUNJARO 5 MG/0.5ML ~~LOC~~ SOAJ: 5.0000 mg | SUBCUTANEOUS | 0 refills | Status: DC

## 2023-12-16 NOTE — Patient Instructions (Signed)
 Thank you, [name] for allowing us  to provide your care today. Today we discussed many concerns today.    For the right back pain: - Please begin physical therapy, I have printed you off some exercises you can try at home in the meantime - I have sent in lidocaine  patches, Voltaren  gel, and Robaxin .  For the Robaxin , this is a muscle relaxer, this may make you more tired throughout the day so please use with caution.  You may take 1 tablet 3 times a day as needed for muscle spasms.  For the diabetes: - We can increase the Mounjaro  dose to 5 mg weekly - I have ordered the next two Mounjaro  doses.  If you are not having any side effects including nausea, vomiting, new constipation, or diarrhea with the 5 mg dosage AND you have completed 4 weeks of 5 mg, then you may increase to 7.5 mg of Mounjaro  weekly.  The same goes for the transition from 7.5 to 10 mg of Mounjaro  weekly.  You must have 4 weekly injections of the same dose completed prior to increasing to the next dose.  For the postmenopausal bleeding and abnormal Pap smear: - I have ordered a pelvic ultrasound, please have this done - I have also sent a referral to gynecology: This is very important that you see a gynecologist for further evaluation!!!  I have ordered the following labs for you:   Lab Orders         Hemoglobin A1c         Basic metabolic panel with GFR         Microalbumin / Creatinine Urine Ratio       Referrals ordered today:    Referral Orders         Ambulatory referral to Ophthalmology         Ambulatory referral to Obstetrics / Gynecology         Ambulatory referral to Physical Therapy      I have ordered the following medication/changed the following medications:   Stop the following medications: Medications Discontinued During This Encounter  Medication Reason   HYDROcodone -acetaminophen  (NORCO/VICODIN) 5-325 MG tablet    tirzepatide  (MOUNJARO ) 2.5 MG/0.5ML Pen Dose change     Start the  following medications: Meds ordered this encounter  Medications   diclofenac  Sodium (VOLTAREN ) 1 % GEL    Sig: Apply 4 g topically 4 (four) times daily.    Dispense:  120 g    Refill:  3   lidocaine  (LIDODERM ) 5 %    Sig: Place 1 patch onto the skin daily for 10 days. Remove & Discard patch within 12 hours or as directed by MD    Dispense:  10 patch    Refill:  0   methocarbamol  (ROBAXIN ) 500 MG tablet    Sig: Take 1 tablet (500 mg total) by mouth every 8 (eight) hours as needed for muscle spasms. Please be cautious as this medicine may make you more drowsy.    Dispense:  15 tablet    Refill:  0   tirzepatide  (MOUNJARO ) 5 MG/0.5ML Pen    Sig: Inject 5 mg into the skin once a week.    Dispense:  2 mL    Refill:  0   tirzepatide  (MOUNJARO ) 7.5 MG/0.5ML Pen    Sig: Inject 7.5 mg into the skin once a week.    Dispense:  2 mL    Refill:  0   tirzepatide  (MOUNJARO ) 10 MG/0.5ML Pen  Sig: Inject 10 mg into the skin once a week.    Dispense:  2 mL    Refill:  0     Follow up: 3 months OR SOONER IF NEEDED    Should you have any questions or concerns please call the internal medicine clinic at [phone].     Please note that our late policy has changed.  If you are more than 15 minutes late to your appointment, you may be asked to reschedule your appointment.  Dr. Kandis, D.O. [facility]

## 2023-12-16 NOTE — Assessment & Plan Note (Signed)
 Patient reports concern of postmenopausal bleeding that she experienced for 1 week about 1 month ago.  Patient reports that she went through menopause about 10 years ago and had bleeding postmenopausal a few years back.  At that time a pelvic ultrasound was obtained in 2018 which showed an endometrial thickness of 8.6 mm.  Patient denies any additional bleeding since 2018 until now.  Plan: -Pelvic US  ordered -Gynecology referral ordered -I stressed the importance of gynecology follow-up with the patient, patient understands

## 2023-12-16 NOTE — Assessment & Plan Note (Signed)
 Patient has a history of an abnormal Pap smear in 2018. Plan: - Gynecology referral placed

## 2023-12-16 NOTE — Assessment & Plan Note (Signed)
 Patient has a history of type 2 diabetes on a regimen of Farxiga  10 mg and Mounjaro  2.5 mg weekly.  Patient's last A1c was 7.1 in January 2025.  Patient denies any side effects from Mounjaro  2.5 mg.  Plan: - Continue Farxiga  10 mg - Increase Mounjaro  to 5 mg for diabetes and weight loss, future orders for Mounjaro  7.5 and 10 mg were sent to the pharmacy.  Patient was provided instructions on when to start the higher dosages of Mounjaro . -A1c, urine ACR, BMP, ophthalmology referral placed today

## 2023-12-16 NOTE — Assessment & Plan Note (Signed)
 Patient presents with a 2-week history of right thoracic/  shoulder blade back pain.  She reported the pain feels like a spasm that lasts for a few seconds.  Her pain radiates from the right thoracic region to the midline.  Her pain is worse with lifting.  She has tried to take Tylenol  at home but this has not helped the pain.  She denies recent trauma or injury.  She denies numbness but does have chronic paresthesias only in the right arm that has not changed.  She denies recent chest pain or shortness of breath or heart burn that correlates with her right thoracic pain.  She denies new onset urinary or fecal incontinence.  Of note , she was seeing orthopedics in May for neck pain and an MRI obtained at that time showed: MRI: moderate neuroforaminal narrowing and cervical spondylosis at C5-6. Cervical spondylosis C4-5. C6-7. No high-grade stenosis disc herniation or significant neural compressive lesion. Moderate to severe cervical foraminal stenosis at C6-7 on the left   On exam, bilateral shoulder range of motion is intact, Spurling test negative, right trapezius and right levator scapula a muscle is tender to palpate, tightness is present in both muscles mentioned above.  I suspect the patient has a right trapezius and right levator scapulae spasm. Plan: -PT - Patient was provided printout exercises to start in the meantime -Robaxin  500 mg 3 times daily as needed, 5-day course sent -Lidocaine  patches sent -Voltaren  gel ordered

## 2023-12-17 ENCOUNTER — Ambulatory Visit: Payer: Self-pay | Admitting: Student

## 2023-12-17 LAB — BASIC METABOLIC PANEL WITH GFR
BUN/Creatinine Ratio: 18 (ref 12–28)
BUN: 33 mg/dL — ABNORMAL HIGH (ref 8–27)
CO2: 22 mmol/L (ref 20–29)
Calcium: 9.3 mg/dL (ref 8.7–10.3)
Chloride: 100 mmol/L (ref 96–106)
Creatinine, Ser: 1.87 mg/dL — ABNORMAL HIGH (ref 0.57–1.00)
Glucose: 152 mg/dL — ABNORMAL HIGH (ref 70–99)
Potassium: 4.3 mmol/L (ref 3.5–5.2)
Sodium: 139 mmol/L (ref 134–144)
eGFR: 29 mL/min/1.73 — ABNORMAL LOW (ref >59)

## 2023-12-17 LAB — MICROALBUMIN / CREATININE URINE RATIO
Creatinine, Urine: 112.5 mg/dL
Microalb/Creat Ratio: 7 mg/g{creat} (ref 0–29)
Microalbumin, Urine: 8.4 ug/mL

## 2023-12-17 LAB — HEMOGLOBIN A1C
Est. average glucose Bld gHb Est-mCnc: 148 mg/dL
Hgb A1c MFr Bld: 6.8 % — ABNORMAL HIGH (ref 4.8–5.6)

## 2023-12-17 NOTE — Progress Notes (Signed)
 Internal Medicine Clinic Attending  Case discussed with the resident at the time of the visit.  We reviewed the resident's history and exam and pertinent patient test results.  I agree with the assessment, diagnosis, and plan of care documented in the resident's note.

## 2023-12-26 ENCOUNTER — Ambulatory Visit (HOSPITAL_COMMUNITY)
Admission: RE | Admit: 2023-12-26 | Discharge: 2023-12-26 | Disposition: A | Discharge status: Home / Self Care | Source: Ambulatory Visit | Attending: Internal Medicine | Admitting: Internal Medicine

## 2023-12-26 DIAGNOSIS — N950 Postmenopausal bleeding: Secondary | ICD-10-CM | POA: Diagnosis present

## 2023-12-26 DIAGNOSIS — R9389 Abnormal findings on diagnostic imaging of other specified body structures: Secondary | ICD-10-CM | POA: Diagnosis not present

## 2023-12-30 ENCOUNTER — Other Ambulatory Visit: Payer: Self-pay

## 2023-12-30 DIAGNOSIS — I5043 Acute on chronic combined systolic (congestive) and diastolic (congestive) heart failure: Secondary | ICD-10-CM

## 2023-12-30 MED ORDER — SPIRONOLACTONE 25 MG PO TABS: 25.0000 mg | ORAL_TABLET | Freq: Every day | ORAL | 0 refills | Status: AC

## 2024-01-23 ENCOUNTER — Telehealth (HOSPITAL_COMMUNITY): Payer: Self-pay | Admitting: Cardiology

## 2024-01-24 LAB — HM MAMMOGRAPHY

## 2024-01-27 ENCOUNTER — Ambulatory Visit (HOSPITAL_BASED_OUTPATIENT_CLINIC_OR_DEPARTMENT_OTHER)
Admission: RE | Admit: 2024-01-27 | Discharge: 2024-01-27 | Disposition: A | Discharge status: Home / Self Care | Source: Ambulatory Visit | Attending: Internal Medicine | Admitting: Internal Medicine

## 2024-01-27 DIAGNOSIS — M8588 Other specified disorders of bone density and structure, other site: Secondary | ICD-10-CM | POA: Insufficient documentation

## 2024-01-27 DIAGNOSIS — Z13820 Encounter for screening for osteoporosis: Secondary | ICD-10-CM | POA: Diagnosis present

## 2024-02-04 ENCOUNTER — Other Ambulatory Visit: Payer: Self-pay | Admitting: Student

## 2024-02-04 NOTE — Telephone Encounter (Signed)
 Patient should be increasing dosage. Patient should be taking next dosage 10 mg, rx is scheduled for 02/10/2024.

## 2024-02-10 ENCOUNTER — Other Ambulatory Visit (HOSPITAL_COMMUNITY): Payer: Self-pay

## 2024-02-11 ENCOUNTER — Telehealth: Payer: Self-pay | Admitting: Student

## 2024-02-11 NOTE — Telephone Encounter (Signed)
 Copied from CRM #[id number]. Topic: Clinical - Medication Refill >> Feb 11, 2024  9:39 AM Alfonso ORN wrote: Medication: tirzepatide  (MOUNJARO ) 10 MG/0.5ML Pen  Has the patient contacted their pharmacy? Yes (Agent: If no, request that the patient contact the pharmacy for the refill. If patient does not wish to contact the pharmacy document the reason why and proceed with request.) (Agent: If yes, when and what did the pharmacy advise?)  This is the patient's preferred pharmacy:  [facility] [address] [place] - [address] [place] [address] Phone: [phone] Fax: [phone]  Is this the correct pharmacy for this prescription? Yes If no, delete pharmacy and type the correct one.   Has the prescription been filled recently? No  Is the patient out of the medication? Yes  Has the patient been seen for an appointment in the last year OR does the patient have an upcoming appointment? Yes  Can we respond through MyChart? No  Agent: Please be advised that Rx refills may take up to 3 business days. We ask that you follow-up with your pharmacy.

## 2024-02-11 NOTE — Telephone Encounter (Signed)
 Already refilled on 02/10/24 in a separate refill encounter.

## 2024-02-24 ENCOUNTER — Telehealth: Payer: Self-pay | Admitting: Pharmacist

## 2024-02-24 NOTE — Progress Notes (Signed)
 Pharmacy Quality Measure Review  This patient is appearing on the insurance-providing list for being at risk of failing the adherence measure for Statin Use in Persons with Diabetes (SUPD) medications this calendar year.   Previously prescribed atorvastatin  40 mg daily. This was last filled 06/28/2022. It was discontinued from her list at a GI appointment in January of 2024, noting that the patient was not taking the medication.   Will ask embedded pharmacist to reach out to patient to discuss.   Catie IVAR Centers, PharmD, [facility] Clinical Pharmacist [phone]

## 2024-03-02 ENCOUNTER — Telehealth: Payer: Self-pay | Admitting: Student

## 2024-03-02 MED ORDER — SACUBITRIL-VALSARTAN 24-26 MG PO TABS: 1.0000 | ORAL_TABLET | Freq: Two times a day (BID) | ORAL | 1 refills | Status: DC

## 2024-03-02 NOTE — Telephone Encounter (Signed)
 Copied from CRM #[id number]. Topic: Clinical - Medication Refill >> Mar 02, 2024 10:21 AM Debby BROCKS wrote: Medication: sacubitril -valsartan  (ENTRESTO ) 24-26 MG  Has the patient contacted their pharmacy? Yes (Agent: If no, request that the patient contact the pharmacy for the refill. If patient does not wish to contact the pharmacy document the reason why and proceed with request.) (Agent: If yes, when and what did the pharmacy advise?)  Pharmacy states they have sent in a req but no response  This is the patient's preferred pharmacy:  [id number] [address], [place] - [address] [place] [address] Phone: [phone] Fax: [phone]  Is this the correct pharmacy for this prescription? Yes If no, delete pharmacy and type the correct one.   Has the prescription been filled recently? No  Is the patient out of the medication? Yes  Has the patient been seen for an appointment in the last year OR does the patient have an upcoming appointment? Yes  Can we respond through MyChart? Yes  Agent: Please be advised that Rx refills may take up to 3 business days. We ask that you follow-up with your pharmacy.

## 2024-03-02 NOTE — Telephone Encounter (Signed)
 Medication sent to pharmacy

## 2024-03-02 NOTE — Progress Notes (Signed)
 Pharmacy Quality Measure Review  This patient is appearing on the insurance-providing list for being at risk of failing the adherence measure for Statin Use in Persons with Diabetes (SUPD) medications this calendar year.   Attempted to contact patient to discuss indication for statin, as it appears she was previously prescribed atorvastatin  40 mg daily. 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 She is also due for PCP follow-up in Dec 2025. While I was unable to outreach patient today, will discuss with her PCP and attempt to re-initiate statin at follow-up.   Lorain Baseman, PharmD [facility] [phone]

## 2024-03-20 NOTE — Progress Notes (Signed)
 Reattempted to outreach patient about re-initiating statin. LVM with call back number.   Tina Lara, PharmD [facility] [phone]

## 2024-04-01 ENCOUNTER — Ambulatory Visit

## 2024-04-01 ENCOUNTER — Telehealth: Payer: Self-pay

## 2024-04-01 VITALS — Ht 69.0 in | Wt 306.0 lb

## 2024-04-01 DIAGNOSIS — E119 Type 2 diabetes mellitus without complications: Secondary | ICD-10-CM

## 2024-04-01 DIAGNOSIS — Z0000 Encounter for general adult medical examination without abnormal findings: Secondary | ICD-10-CM | POA: Diagnosis not present

## 2024-04-01 MED ORDER — BLOOD GLUCOSE MONITORING SUPPL DEVI: 1.0000 | 0 refills | Status: AC

## 2024-04-01 MED ORDER — BLOOD GLUCOSE TEST VI STRP: 1.0000 | ORAL_STRIP | 0 refills | Status: DC

## 2024-04-01 MED ORDER — LANCETS MISC: 1.0000 | 0 refills | Status: DC

## 2024-04-01 MED ORDER — LANCET DEVICE MISC: 1.0000 | 0 refills | Status: AC

## 2024-04-01 NOTE — Addendum Note (Signed)
 Addended by: ELICIA SHARPER on: 04/01/2024 12:22 PM   Modules accepted: Orders

## 2024-04-01 NOTE — Progress Notes (Addendum)
 "  Chief Complaint  Patient presents with   Medicare Wellness    SUBSEQUENT     Subjective:   Salle  L Sigmund is a 68 y.o. female who presents for a Medicare Annual Wellness Visit.  Visit info / Clinical Intake: Medicare Wellness Visit Type:: Subsequent Annual Wellness Visit Persons participating in visit and providing information:: patient Medicare Wellness Visit Mode:: Telephone If telephone:: video declined Since this visit was completed virtually, some vitals may be partially provided or unavailable. Missing vitals are due to the limitations of the virtual format.: Documented vitals are patient reported If Telephone or Video please confirm:: I connected with patient using audio/video enable telemedicine. I verified patient identity with two identifiers, discussed telehealth limitations, and patient agreed to proceed. Patient Location:: HOME Provider Location:: HOME OFFICE Interpreter Needed?: No Pre-visit prep was completed: yes AWV questionnaire completed by patient prior to visit?: no Living arrangements:: (!) lives alone ([employer] SECURITY ALERT) Patient's Overall Health Status Rating: (!) fair Typical amount of pain: none Does pain affect daily life?: no Are you currently prescribed opioids?: no  Dietary Habits and Nutritional Risks How many meals a day?: 3 (SNACKS) Eats fruit and vegetables daily?: yes Most meals are obtained by: preparing own meals In the last 2 weeks, have you had any of the following?: none Diabetic:: (!) yes Any non-healing wounds?: no How often do you check your BS?: as needed (NEEDS A NEW GLUCOMETER) Would you like to be referred to a Nutritionist or for Diabetic Management? : no  Functional Status Activities of Daily Living (to include ambulation/medication): Independent Ambulation: Independent with device- listed below Home Assistive Devices/Equipment: [place]; Eyeglasses Medication Administration: Independent Home Management (perform basic  housework or laundry): Independent Manage your own finances?: yes Primary transportation is: driving Concerns about vision?: (!) yes (CATARACTS) Concerns about hearing?: no  Fall Screening Falls in the past year?: 0 Number of falls in past year: 0 Was there an injury with Fall?: 0 Fall Risk Category Calculator: 0 Patient Fall Risk Level: Low Fall Risk  Fall Risk Patient at Risk for Falls Due to: No Fall Risks Fall risk Follow up: Falls evaluation completed; Education provided  Home and Transportation Safety: All rugs have non-skid backing?: N/A, no rugs All stairs or steps have railings?: N/A, no stairs Grab bars in the bathtub or shower?: yes Have non-skid surface in bathtub or shower?: yes Good home lighting?: yes Regular seat belt use?: yes Hospital stays in the last year:: no  Cognitive Assessment Difficulty concentrating, remembering, or making decisions? : no (BSE: READING, WORD SEARCH) Will 6CIT or Mini Cog be Completed: yes What year is it?: 0 points What month is it?: 0 points About what time is it?: 0 points Count backwards from 20 to 1: 0 points Say the months of the year in reverse: 0 points Repeat the address phrase from earlier: 0 points 6 CIT Score: 0 points  Advance Directives (For Healthcare) Does Patient Have a Medical Advance Directive?: Yes Does patient want to make changes to medical advance directive?: No - Patient declined Type of Advance Directive: Healthcare Power of [place]; Living will Copy of Healthcare Power of Attorney in Chart?: No - copy requested Copy of Living Will in Chart?: No - copy requested  Reviewed/Updated  Reviewed/Updated: Reviewed All (Medical, Surgical, Family, Medications, Allergies, Care Teams, Patient Goals)    Allergies (verified) Percocet [oxycodone -acetaminophen ]   Current Medications (verified) Outpatient Encounter Medications as of 04/01/2024  Medication Sig   acetaminophen  (TYLENOL ) 500 MG tablet Take  1,500  mg by mouth every 6 (six) hours as needed for moderate pain or headache.    aspirin  EC 81 MG tablet Take 1 tablet (81 mg total) by mouth daily.   Benzocaine, Topical, (BOIL EASE MAXIMUM STRENGTH) 20 % OINT Apply 1 application  topically daily as needed (On Stomach).   carvedilol  (COREG ) 6.25 MG tablet TAKE 1 TABLET(6.25 MG) BY MOUTH TWICE DAILY WITH A MEAL   dapagliflozin  propanediol (FARXIGA ) 10 MG TABS tablet Take 1 tablet (10 mg total) by mouth daily.   diclofenac  Sodium (VOLTAREN ) 1 % GEL Apply 4 g topically 4 (four) times daily.   Menthol , Topical Analgesic, (BIOFREEZE EX) Apply 1 application  topically daily as needed (back pain).   methocarbamol  (ROBAXIN ) 500 MG tablet Take 1 tablet (500 mg total) by mouth every 8 (eight) hours as needed for muscle spasms. Please be cautious as this medicine may make you more drowsy.   neomycin-bacitracin-polymyxin (NEOSPORIN) [phone] ointment Apply 1 Application topically daily as needed (On stomach).   pantoprazole  (PROTONIX ) 40 MG tablet TAKE 1 TABLET(40 MG) BY MOUTH TWICE DAILY BEFORE A MEAL   sacubitril -valsartan  (ENTRESTO ) 24-26 MG Take 1 tablet by mouth 2 (two) times daily.   spironolactone  ([name] ) 25 MG tablet Take 1 tablet (25 mg total) by mouth daily. TAKE 1 TABLET(25 MG) BY MOUTH DAILY   tirzepatide  (MOUNJARO ) 10 MG/0.5ML Pen Inject 10 mg into the skin once a week.   tirzepatide  (MOUNJARO ) 5 MG/0.5ML Pen Inject 5 mg into the skin once a week.   tirzepatide  (MOUNJARO ) 7.5 MG/0.5ML Pen Inject 7.5 mg into the skin once a week.   torsemide  (DEMADEX ) 20 MG tablet Take 2 tablets (40 mg total) by mouth every morning AND 1 tablet (20 mg total) every evening.   No facility-administered encounter medications on file as of 04/01/2024.    History: Past Medical History:  Diagnosis Date   Anxiety    Cataract    NS OD   Chest pain 08/14/2016   Chronic systolic heart failure (HCC)    Complication of anesthesia    Per patient needs a lot of  anesthesia w/surgery ,I had toruble with a spinal block not taking    Diabetes mellitus without complication (HCC)    Type 2   Diabetic retinopathy (HCC)    BDR   Full dentures    GERD (gastroesophageal reflux disease)    Heart failure (HCC)    History of surgery on arm    right arm broken   Hyperlipidemia    Hypertension    never diagnosed by physician per pt   Macular degeneration    Obesity    Past Surgical History:  Procedure Laterality Date   BIOPSY  09/30/2017   Procedure: BIOPSY;  Surgeon: Albertus Gordy HERO, MD;  Location: [facility];  Service: Gastroenterology;;   BIOPSY  07/31/2022   Procedure: BIOPSY;  Surgeon: Albertus Gordy HERO, MD;  Location: [facility];  Service: Gastroenterology;;   CERVICAL CONIZATION W/BX N/A 09/10/2017   Procedure: CONIZATION CERVIX WITH BIOPSY - COLD KNIFE;  Surgeon: Starla Harland BROCKS, MD;  Location: [facility];  Service: Gynecology;  Laterality: N/A;   CESAREAN SECTION  1989   x 1   CHOLECYSTECTOMY     gall stones   COLONOSCOPY WITH PROPOFOL  N/A 09/30/2017   Procedure: COLONOSCOPY WITH PROPOFOL ;  Surgeon: Albertus Gordy HERO, MD;  Location: [facility];  Service: Gastroenterology;  Laterality: N/A;   COLONOSCOPY WITH PROPOFOL  N/A 07/31/2022   Procedure: COLONOSCOPY WITH PROPOFOL ;  Surgeon: Albertus Gordy HERO, MD;  Location: [facility];  Service: Gastroenterology;  Laterality: N/A;   DILATION AND CURETTAGE OF UTERUS N/A 09/10/2017   Procedure: DILATATION AND CURETTAGE;  Surgeon: Starla Harland BROCKS, MD;  Location: [facility];  Service: Gynecology;  Laterality: N/A;   ESOPHAGOGASTRODUODENOSCOPY (EGD) WITH PROPOFOL  N/A 07/31/2022   Procedure: ESOPHAGOGASTRODUODENOSCOPY (EGD) WITH PROPOFOL ;  Surgeon: Albertus Gordy HERO, MD;  Location: [facility];  Service: Gastroenterology;  Laterality: N/A;   ESOPHAGOGASTRODUODENOSCOPY (EGD) WITH PROPOFOL  N/A 08/12/2023   Procedure: ESOPHAGOGASTRODUODENOSCOPY (EGD) WITH PROPOFOL ;  Surgeon: Albertus Gordy HERO, MD;  Location: [facility];  Service:  Gastroenterology;  Laterality: N/A;   HEMOSTASIS CLIP PLACEMENT  07/31/2022   Procedure: HEMOSTASIS CLIP PLACEMENT;  Surgeon: Albertus Gordy HERO, MD;  Location: [facility];  Service: Gastroenterology;;   MULTIPLE TOOTH EXTRACTIONS     dentures upper and lowers   POLYPECTOMY  09/30/2017   Procedure: POLYPECTOMY;  Surgeon: Albertus Gordy HERO, MD;  Location: [facility];  Service: Gastroenterology;;   RIGHT/LEFT HEART CATH AND CORONARY ANGIOGRAPHY N/A 02/23/2022   Procedure: RIGHT/LEFT HEART CATH AND CORONARY ANGIOGRAPHY;  Surgeon: Claudene Victory ORN, MD;  Location: [facility];  Service: Cardiovascular;  Laterality: N/A;   TONSILLECTOMY     Family History  Problem Relation Age of Onset   Kidney disease Mother    Heart disease Mother    Diabetes Mother    Seizures Father    Heart disease Father    Other Father    Breast cancer Neg Hx    Social History   Occupational History   Occupation: disability  Tobacco Use   Smoking status: Former    Current packs/day: 0.25    Average packs/day: 0.3 packs/day for 6.0 years (1.5 ttl pk-yrs)    Types: Cigarettes   Smokeless tobacco: Never   Tobacco comments:    quit 36 yrs ago  Vaping Use   Vaping status: Never Used  Substance and Sexual Activity   Alcohol use: Not Currently    Comment: occasionally   Drug use: No   Sexual activity: Not Currently    Birth control/protection: None   Tobacco Counseling Counseling given: Not Answered Tobacco comments: quit 36 yrs ago  SDOH Screenings   Food Insecurity: Food Insecurity Present (04/01/2024)  Housing: Low Risk (04/01/2024)  Transportation Needs: No Transportation Needs (04/01/2024)  Utilities: Not At Risk (04/01/2024)  Alcohol Screen: Low Risk (04/01/2024)  Depression (PHQ2-9): Low Risk (04/01/2024)  Financial Resource Strain: Low Risk (04/01/2024)  Physical Activity: Insufficiently Active (04/01/2024)  Social Connections: Moderately Isolated (04/01/2024)  Stress: No Stress Concern Present  (04/01/2024)  Tobacco Use: Medium Risk (04/01/2024)  Health Literacy: Adequate Health Literacy (04/01/2024)   See flowsheets for full screening details  Depression Screen PHQ 2 & 9 Depression Scale- Over the past 2 weeks, how often have you been bothered by any of the following problems? Little interest or pleasure in doing things: 0 Feeling down, depressed, or hopeless (PHQ Adolescent also includes...irritable): 0 PHQ-2 Total Score: 0 Trouble falling or staying asleep, or sleeping too much: 0 Feeling tired or having little energy: 0 Poor appetite or overeating (PHQ Adolescent also includes...weight loss): 0 Feeling bad about yourself - or that you are a failure or have let yourself or your family down: 0 Trouble concentrating on things, such as reading the newspaper or watching television (PHQ Adolescent also includes...like school work): 0 Moving or speaking so slowly that other people could have noticed. Or the opposite - being so fidgety or  restless that you have been moving around a lot more than usual: 0 Thoughts that you would be better off dead, or of hurting yourself in some way: 0 PHQ-9 Total Score: 0 If you checked off any problems, how difficult have these problems made it for you to do your work, take care of things at home, or get along with other people?: Not difficult at all  Depression Treatment Depression Interventions/Treatment : EYV7-0 Score <4 Follow-up Not Indicated     Goals Addressed             This Visit's Progress    04/01/2024: My healthcare goals for 2026 is to maintain my health better by losing weight, having my cataracts removed and increasing physical activity.               Objective:    Today's Vitals   04/01/24 1032  Weight: (!) 306 lb (138.8 kg)  Height: 5' 9 (1.753 m)  PainSc: 0-No pain   Body mass index is 45.19 kg/m.  Hearing/Vision screen Hearing Screening - Comments:: Denies hearing difficulties.  Vision Screening -  Comments:: Wears rx glasses - not up to date with routine eye exams. Patient has cataracts. In the process of looking for an ophthalmologist.  Immunizations and Health Maintenance Health Maintenance  Topic Date Due   DTaP/Tdap/Td (1 - Tdap) Never done   Pneumococcal Vaccine: 50+ Years (1 of 2 - PCV) Never done   Zoster Vaccines- Shingrix (1 of 2) Never done   OPHTHALMOLOGY EXAM  05/20/2021   FOOT EXAM  01/16/2023   Influenza Vaccine  Never done   COVID-19 Vaccine (1 - 2025-26 season) Never done   HEMOGLOBIN A1C  03/16/2024   Diabetic kidney evaluation - eGFR measurement  12/15/2024   Diabetic kidney evaluation - Urine ACR  12/15/2024   Medicare Annual Wellness (AWV)  04/01/2025   Mammogram  01/23/2026   Colonoscopy  07/30/2032   Bone Density Scan  Completed   Hepatitis C Screening  Completed   Meningococcal B Vaccine  Aged Out        Assessment/Plan:  This is a routine wellness examination for Chiquitta .  Patient Care Team: Nooruddin, Saad, MD as PCP - General (Internal Medicine) Alvan Ronal BRAVO, MD (Inactive) as PCP - Cardiology (Cardiology) Loreda Hacker, DPM as Consulting Physician (Podiatry) Duwayne Purchase, MD as Consulting Physician (Orthopedic Surgery)  I have personally reviewed and noted the following in the patients chart:   Medical and social history Use of alcohol, tobacco or illicit drugs  Current medications and supplements including opioid prescriptions. Functional ability and status Nutritional status Physical activity Advanced directives List of other physicians Hospitalizations, surgeries, and ER visits in previous 12 months Vitals Screenings to include cognitive, depression, and falls Referrals and appointments  No orders of the defined types were placed in this encounter.  In addition, I have reviewed and discussed with patient certain preventive protocols, quality metrics, and best practice recommendations. A written personalized care plan for  preventive services as well as general preventive health recommendations were provided to patient.   Roz LOISE Fuller, LPN   [id number]   Return in about 1 year (around 04/01/2025) for Medicare wellness.  After Visit Summary: (MyChart) Due to this being a telephonic visit, the after visit summary with patients personalized plan was offered to patient via MyChart   Nurse Notes:  HM Addressed: Patient aware of current care gaps. Patient declined all vaccines.  Patient is needing a referral to Ophthalmologist to have  cataracts removed (please see telephone encounter) and she needs a new glucometer to check her glucose.   "

## 2024-04-01 NOTE — Telephone Encounter (Signed)
 Referral for ophthalmology sent today. Glucometer prescription sent to pharmacy. Patient already has a referral to OB/GYN that was placed on 12/2023.  Patient can contact their office to schedule appointment.

## 2024-04-01 NOTE — Telephone Encounter (Signed)
 Patient stated during AWV that she is needing a referral to an Ophthalmologist to removed cataracts, possibly [facility] or [facility].  Patient was previously seeing Dr. Ozell Mirza who is no longer practicing.  Patient requests a new glucometer so that she can check her glucose on a regularly basis. Patient stated that she had her diabetic foot exam completed by [facility] and this nurse will request records to close the gap. Also, patient c/o having uterine bleeding at her age and needs a referral to an OB/GYN to be evaluated for having a thick uterus. Please advise.  Tina Williams N. Tomie, LPN [facility] Annual Wellness Team Direct Dial: [phone]

## 2024-04-01 NOTE — Patient Instructions (Signed)
 Tina Lara,  Thank you for taking the time for your Medicare Wellness Visit. I appreciate your continued commitment to your health goals. Please review the care plan we discussed, and feel free to reach out if I can assist you further.  Please note that Annual Wellness Visits do not include a physical exam. Some assessments may be limited, especially if the visit was conducted virtually. If needed, we may recommend an in-person follow-up with your provider.  Ongoing Care Seeing your primary care provider every 3 to 6 months helps us  monitor your health and provide consistent, personalized care.   Referrals If a referral was made during today's visit and you haven't received any updates within two weeks, please contact the referred provider directly to check on the status.  Recommended Screenings:  Health Maintenance  Topic Date Due   DTaP/Tdap/Td vaccine (1 - Tdap) Never done   Pneumococcal Vaccine for age over 69 (1 of 2 - PCV) Never done   Zoster (Shingles) Vaccine (1 of 2) Never done   Eye exam for diabetics  05/20/2021   Complete foot exam   01/16/2023   Medicare Annual Wellness Visit  02/15/2023   Flu Shot  Never done   COVID-19 Vaccine (1 - 2025-26 season) Never done   Hemoglobin A1C  03/16/2024   Yearly kidney function blood test for diabetes  12/15/2024   Yearly kidney health urinalysis for diabetes  12/15/2024   Breast Cancer Screening  01/23/2026   Colon Cancer Screening  07/30/2032   Osteoporosis screening with Bone Density Scan  Completed   Hepatitis C Screening  Completed   Meningitis B Vaccine  Aged Out       04/01/2024   10:34 AM  Advanced Directives  Does Patient Have a Medical Advance Directive? Yes  Type of Estate Agent of [place];Living will  Does patient want to make changes to medical advance directive? No - Patient declined  Copy of Healthcare Power of Attorney in Chart? No - copy requested    Vision: Annual vision screenings  are recommended for early detection of glaucoma, cataracts, and diabetic retinopathy. These exams can also reveal signs of chronic conditions such as diabetes and high blood pressure.  Dental: Annual dental screenings help detect early signs of oral cancer, gum disease, and other conditions linked to overall health, including heart disease and diabetes.  Please see the attached documents for additional preventive care recommendations.

## 2024-04-01 NOTE — Progress Notes (Signed)
 I reviewed the AWV findings of the licensed medical professional who conducted the visit. I was present in the office suite and immediately available to provide assistance and direction throughout the time the service was provided.

## 2024-04-03 ENCOUNTER — Other Ambulatory Visit: Payer: Self-pay | Admitting: Student

## 2024-04-03 DIAGNOSIS — E119 Type 2 diabetes mellitus without complications: Secondary | ICD-10-CM

## 2024-04-03 NOTE — Telephone Encounter (Signed)
 Copied from CRM [id number]. Topic: Clinical - Medication Refill >> Apr 03, 2024 11:47 AM Cherylann RAMAN wrote: Medication: tirzepatide  (MOUNJARO ) 10 MG/0.5ML Pen   Has the patient contacted their pharmacy? Yes (Agent: If no, request that the patient contact the pharmacy for the refill. If patient does not wish to contact the pharmacy document the reason why and proceed with request.) (Agent: If yes, when and what did the pharmacy advise?)  This is the patient's preferred pharmacy:  [facility], [place] [address], [place] - [address] [place] [address] Phone: [phone] Fax: [phone]  Is this the correct pharmacy for this prescription? Yes If no, delete pharmacy and type the correct one.   Has the prescription been filled recently? No  Is the patient out of the medication? Yes  Has the patient been seen for an appointment in the last year OR does the patient have an upcoming appointment? Yes  Can we respond through MyChart? Yes  Agent: Please be advised that Rx refills may take up to 3 business days. We ask that you follow-up with your pharmacy.

## 2024-04-06 MED ORDER — MOUNJARO 10 MG/0.5ML ~~LOC~~ SOAJ: 10.0000 mg | SUBCUTANEOUS | 2 refills | Status: AC

## 2024-04-06 NOTE — Progress Notes (Signed)
 Internal Medicine Clinic Attending Charissa Bash, MD

## 2024-04-10 ENCOUNTER — Other Ambulatory Visit: Payer: Self-pay | Admitting: Student

## 2024-04-24 ENCOUNTER — Other Ambulatory Visit: Payer: Self-pay | Admitting: Student

## 2024-04-28 NOTE — Progress Notes (Shared)
 " [facility] - Clinic Note  05/06/2024     CHIEF COMPLAINT Patient presents for No chief complaint on file.   HISTORY OF PRESENT ILLNESS: Tina Lara is a 69 y.o. female who presents to the clinic today for:    pt states no change in vision, but says her BP has been high lately   Referring physician: Nooruddin, Saad, MD [address] [place], [address] [place],  [place] [address]  HISTORICAL INFORMATION:   Selected notes from the MEDICAL RECORD NUMBER Referred by Dr. Jacques for retina eval  LEE: 01/08/2020 Ocular Hx-BDR, NS OU PMH-DM    CURRENT MEDICATIONS: No current outpatient medications on file. (Ophthalmic Drugs)   No current facility-administered medications for this visit. (Ophthalmic Drugs)   Current Outpatient Medications (Other)  Medication Sig   Accu-Chek Softclix Lancets lancets USE UP TO FOUR TIMES DAILY   acetaminophen  (TYLENOL ) 500 MG tablet Take 1,500 mg by mouth every 6 (six) hours as needed for moderate pain or headache.    aspirin  EC 81 MG tablet Take 1 tablet (81 mg total) by mouth daily.   Benzocaine, Topical, (BOIL EASE MAXIMUM STRENGTH) 20 % OINT Apply 1 application  topically daily as needed (On Stomach).   Blood Glucose Monitoring Suppl DEVI 1 each by Does not apply route as directed. Dispense based on patient and insurance preference. Use up to four times daily as directed. (FOR ICD-10 E10.9, E11.9).   carvedilol  (COREG ) 6.25 MG tablet TAKE 1 TABLET(6.25 MG) BY MOUTH TWICE DAILY WITH A MEAL   dapagliflozin  propanediol (FARXIGA ) 10 MG TABS tablet Take 1 tablet (10 mg total) by mouth daily.   diclofenac  Sodium (VOLTAREN ) 1 % GEL Apply 4 g topically 4 (four) times daily.   glucose blood (ACCU-CHEK GUIDE TEST) test strip USE TO TEST BLOOD GLUCOSE UP TO FOUR TIMES DAILY   Lancet Device MISC 1 each by Does not apply route as directed. Dispense based on patient and insurance preference. Use up to four times daily as directed. (FOR  ICD-10 E10.9, E11.9).   Menthol , Topical Analgesic, (BIOFREEZE EX) Apply 1 application  topically daily as needed (back pain).   methocarbamol  (ROBAXIN ) 500 MG tablet Take 1 tablet (500 mg total) by mouth every 8 (eight) hours as needed for muscle spasms. Please be cautious as this medicine may make you more drowsy.   neomycin-bacitracin-polymyxin (NEOSPORIN) [phone] ointment Apply 1 Application topically daily as needed (On stomach).   pantoprazole  (PROTONIX ) 40 MG tablet TAKE 1 TABLET(40 MG) BY MOUTH TWICE DAILY BEFORE A MEAL   sacubitril -valsartan  (ENTRESTO ) 24-26 MG TAKE 1 TABLET BY MOUTH TWICE DAILY   spironolactone  ([name] ) 25 MG tablet Take 1 tablet (25 mg total) by mouth daily. TAKE 1 TABLET(25 MG) BY MOUTH DAILY   tirzepatide  (MOUNJARO ) 10 MG/0.5ML Pen Inject 10 mg into the skin once a week.   torsemide  (DEMADEX ) 20 MG tablet Take 2 tablets (40 mg total) by mouth every morning AND 1 tablet (20 mg total) every evening.   No current facility-administered medications for this visit. (Other)      REVIEW OF SYSTEMS:     ALLERGIES Allergies  Allergen Reactions   Percocet [Oxycodone -Acetaminophen ] Itching    PAST MEDICAL HISTORY Past Medical History:  Diagnosis Date   Anxiety    Cataract    NS OD   Chest pain 08/14/2016   Chronic systolic heart failure (HCC)    Complication of anesthesia    Per patient needs a lot of anesthesia  w/surgery ,I had toruble with a spinal block not taking    Diabetes mellitus without complication (HCC)    Type 2   Diabetic retinopathy (HCC)    BDR   Full dentures    GERD (gastroesophageal reflux disease)    Heart failure (HCC)    History of surgery on arm    right arm broken   Hyperlipidemia    Hypertension    never diagnosed by physician per pt   Macular degeneration    Obesity    Past Surgical History:  Procedure Laterality Date   BIOPSY  09/30/2017   Procedure: BIOPSY;  Surgeon: Albertus Gordy HERO, MD;  Location: [facility];   Service: Gastroenterology;;   BIOPSY  07/31/2022   Procedure: BIOPSY;  Surgeon: Albertus Gordy HERO, MD;  Location: [facility];  Service: Gastroenterology;;   CERVICAL CONIZATION W/BX N/A 09/10/2017   Procedure: CONIZATION CERVIX WITH BIOPSY - COLD KNIFE;  Surgeon: Starla Harland BROCKS, MD;  Location: [facility];  Service: Gynecology;  Laterality: N/A;   CESAREAN SECTION  1989   x 1   CHOLECYSTECTOMY     gall stones   COLONOSCOPY WITH PROPOFOL  N/A 09/30/2017   Procedure: COLONOSCOPY WITH PROPOFOL ;  Surgeon: Albertus Gordy HERO, MD;  Location: [facility];  Service: Gastroenterology;  Laterality: N/A;   COLONOSCOPY WITH PROPOFOL  N/A 07/31/2022   Procedure: COLONOSCOPY WITH PROPOFOL ;  Surgeon: Albertus Gordy HERO, MD;  Location: [facility];  Service: Gastroenterology;  Laterality: N/A;   DILATION AND CURETTAGE OF UTERUS N/A 09/10/2017   Procedure: DILATATION AND CURETTAGE;  Surgeon: Starla Harland BROCKS, MD;  Location: [facility];  Service: Gynecology;  Laterality: N/A;   ESOPHAGOGASTRODUODENOSCOPY (EGD) WITH PROPOFOL  N/A 07/31/2022   Procedure: ESOPHAGOGASTRODUODENOSCOPY (EGD) WITH PROPOFOL ;  Surgeon: Albertus Gordy HERO, MD;  Location: [facility];  Service: Gastroenterology;  Laterality: N/A;   ESOPHAGOGASTRODUODENOSCOPY (EGD) WITH PROPOFOL  N/A 08/12/2023   Procedure: ESOPHAGOGASTRODUODENOSCOPY (EGD) WITH PROPOFOL ;  Surgeon: Albertus Gordy HERO, MD;  Location: [facility];  Service: Gastroenterology;  Laterality: N/A;   HEMOSTASIS CLIP PLACEMENT  07/31/2022   Procedure: HEMOSTASIS CLIP PLACEMENT;  Surgeon: Albertus Gordy HERO, MD;  Location: [facility];  Service: Gastroenterology;;   MULTIPLE TOOTH EXTRACTIONS     dentures upper and lowers   POLYPECTOMY  09/30/2017   Procedure: POLYPECTOMY;  Surgeon: Albertus Gordy HERO, MD;  Location: [facility];  Service: Gastroenterology;;   RIGHT/LEFT HEART CATH AND CORONARY ANGIOGRAPHY N/A 02/23/2022   Procedure: RIGHT/LEFT HEART CATH AND CORONARY ANGIOGRAPHY;  Surgeon: Claudene Victory ORN, MD;  Location: [facility];  Service: Cardiovascular;  Laterality: N/A;   TONSILLECTOMY      FAMILY HISTORY Family History  Problem Relation Age of Onset   Kidney disease Mother    Heart disease Mother    Diabetes Mother    Seizures Father    Heart disease Father    Other Father    Breast cancer Neg Hx     SOCIAL HISTORY Social History   Tobacco Use   Smoking status: Former    Current packs/day: 0.25    Average packs/day: 0.3 packs/day for 6.0 years (1.5 ttl pk-yrs)    Types: Cigarettes   Smokeless tobacco: Never   Tobacco comments:    quit 36 yrs ago  Vaping Use   Vaping status: Never Used  Substance Use Topics   Alcohol use: Not Currently    Comment: occasionally   Drug use: No         OPHTHALMIC EXAM:  Not recorded  IMAGING AND PROCEDURES  Imaging and Procedures for 05/06/2024            ASSESSMENT/PLAN:    ICD-10-CM   1. Moderate nonproliferative diabetic retinopathy of both eyes with macular edema associated with type 2 diabetes mellitus (HCC)  Z88.6686     2. Diabetes mellitus treated with injections of non-insulin  medication (HCC)  E11.9    Z79.85     3. Diabetes mellitus treated with oral medication (HCC)  E11.9    Z79.84     4. Intermediate stage nonexudative age-related macular degeneration of both eyes  H35.3132     5. Vitreomacular traction syndrome of both eyes  H43.823     6. Macular retinal cyst of right eye  H35.341     7. Essential hypertension  I10     8. Hypertensive retinopathy of both eyes  H35.033     9. Combined forms of age-related cataract of both eyes  H25.813      1-3. Moderate nonproliferative diabetic retinopathy with DME, OD  - A1C 6.8 (09.15.25)  - OS without DME - exam shows scattered MA OU - s/p IVA OD #1 (02.18.22)  - FA 11.08.21 shows scattered late leaking MA OU, no NV OU  - OCT shows OD: Mild interval increase in cystic changes centrally; OS: Interval development of mild, central VMT  - BCVA OD 20/60 from 20/50; OS  stable 20/40 - recommend IVA OD #2 today, 02.04.26 - pt unable to receive injection today due to insurance auth requirement - f/u week of February 28 -- DFE/OCT, IVA OD  4. Age related macular degeneration, non-exudative, both eyes  - intermediate stage  - FA 11.8.21 - no CNV OU  - Recommend AREDS 2 supplements and amsler grid monitoring  5,6. VMT OU w/ macular cyst OD  7,8. Hypertensive retinopathy OU - discussed importance of tight BP control - monitor  9. Mixed Cataract OU - The symptoms of cataract, surgical options, and treatments and risks were discussed with patient. - discussed diagnosis and progression - approaching visual significance   Ophthalmic Meds Ordered this visit:  No orders of the defined types were placed in this encounter.      No follow-ups on file.  There are no Patient Instructions on file for this visit.  This document serves as a record of services personally performed by Redell JUDITHANN Hans, MD, PhD. It was created on their behalf by Almetta Pesa, an ophthalmic technician. The creation of this record is the provider's dictation and/or activities during the visit.    Electronically signed by: Almetta Pesa, OA, 05/06/24  7:27 AM  This document serves as a record of services personally performed by Redell JUDITHANN Hans, MD, PhD. It was created on their behalf by Wanda GEANNIE Keens, COT an ophthalmic technician. The creation of this record is the provider's dictation and/or activities during the visit.    Electronically signed by:  Wanda GEANNIE Keens, COT  05/06/24 7:28 AM  Redell JUDITHANN Hans, M.D., Ph.D. Diseases & Surgery of the Retina and Vitreous [facility]    Abbreviations: M myopia (nearsighted); A astigmatism; H hyperopia (farsighted); P presbyopia; Mrx spectacle prescription;  CTL contact lenses; OD right eye; OS left eye; OU both eyes  XT exotropia; ET esotropia; PEK punctate epithelial keratitis; PEE punctate  epithelial erosions; DES dry eye syndrome; MGD meibomian gland dysfunction; ATs artificial tears; PFAT's preservative free artificial tears; NSC nuclear sclerotic cataract; PSC posterior subcapsular cataract; ERM epi-retinal membrane; PVD posterior  vitreous detachment; RD retinal detachment; DM diabetes mellitus; DR diabetic retinopathy; NPDR non-proliferative diabetic retinopathy; PDR proliferative diabetic retinopathy; CSME clinically significant macular edema; DME diabetic macular edema; dbh dot blot hemorrhages; CWS cotton wool spot; POAG primary open angle glaucoma; C/D cup-to-disc ratio; HVF humphrey visual field; GVF goldmann visual field; OCT optical coherence tomography; IOP intraocular pressure; BRVO Radney retinal vein occlusion; CRVO central retinal vein occlusion; CRAO central retinal artery occlusion; BRAO Nourse retinal artery occlusion; RT retinal tear; SB scleral buckle; PPV pars plana vitrectomy; VH Vitreous hemorrhage; PRP panretinal laser photocoagulation; IVK intravitreal kenalog; VMT vitreomacular traction; MH Macular hole;  NVD neovascularization of the disc; NVE neovascularization elsewhere; AREDS age related eye disease study; ARMD age related macular degeneration; POAG primary open angle glaucoma; EBMD epithelial/anterior basement membrane dystrophy; ACIOL anterior chamber intraocular lens; IOL intraocular lens; PCIOL posterior chamber intraocular lens; Phaco/IOL phacoemulsification with intraocular lens placement; PRK photorefractive keratectomy; LASIK laser assisted in situ keratomileusis; HTN hypertension; DM diabetes mellitus; COPD chronic obstructive pulmonary disease  "

## 2024-04-30 ENCOUNTER — Other Ambulatory Visit: Payer: Self-pay | Admitting: Student

## 2024-05-06 ENCOUNTER — Encounter (INDEPENDENT_AMBULATORY_CARE_PROVIDER_SITE_OTHER): Admitting: Ophthalmology

## 2024-05-06 DIAGNOSIS — H35341 Macular cyst, hole, or pseudohole, right eye: Secondary | ICD-10-CM

## 2024-05-06 DIAGNOSIS — H353132 Nonexudative age-related macular degeneration, bilateral, intermediate dry stage: Secondary | ICD-10-CM

## 2024-05-06 DIAGNOSIS — H25813 Combined forms of age-related cataract, bilateral: Secondary | ICD-10-CM

## 2024-05-06 DIAGNOSIS — E119 Type 2 diabetes mellitus without complications: Secondary | ICD-10-CM

## 2024-05-06 DIAGNOSIS — E113313 Type 2 diabetes mellitus with moderate nonproliferative diabetic retinopathy with macular edema, bilateral: Secondary | ICD-10-CM

## 2024-05-06 DIAGNOSIS — H35033 Hypertensive retinopathy, bilateral: Secondary | ICD-10-CM

## 2024-05-06 DIAGNOSIS — I10 Essential (primary) hypertension: Secondary | ICD-10-CM

## 2024-05-06 DIAGNOSIS — H43823 Vitreomacular adhesion, bilateral: Secondary | ICD-10-CM
